# Patient Record
Sex: Female | Born: 1965 | Race: White | Hispanic: No | Smoking: Current some day smoker
Health system: Southern US, Community
[De-identification: ages and names within clinical notes are randomized; demographics above are authoritative.]

## PROBLEM LIST (undated history)

## (undated) DIAGNOSIS — F32A Depression, unspecified: Secondary | ICD-10-CM

## (undated) DIAGNOSIS — K219 Gastro-esophageal reflux disease without esophagitis: Secondary | ICD-10-CM

## (undated) DIAGNOSIS — M1990 Unspecified osteoarthritis, unspecified site: Secondary | ICD-10-CM

## (undated) DIAGNOSIS — I499 Cardiac arrhythmia, unspecified: Secondary | ICD-10-CM

## (undated) DIAGNOSIS — IMO0002 Reserved for concepts with insufficient information to code with codable children: Secondary | ICD-10-CM

## (undated) DIAGNOSIS — T4145XA Adverse effect of unspecified anesthetic, initial encounter: Secondary | ICD-10-CM

## (undated) DIAGNOSIS — I341 Nonrheumatic mitral (valve) prolapse: Secondary | ICD-10-CM

## (undated) DIAGNOSIS — R112 Nausea with vomiting, unspecified: Secondary | ICD-10-CM

## (undated) DIAGNOSIS — K5900 Constipation, unspecified: Secondary | ICD-10-CM

## (undated) DIAGNOSIS — I10 Essential (primary) hypertension: Secondary | ICD-10-CM

## (undated) DIAGNOSIS — K649 Unspecified hemorrhoids: Secondary | ICD-10-CM

## (undated) DIAGNOSIS — R002 Palpitations: Secondary | ICD-10-CM

## (undated) DIAGNOSIS — F329 Major depressive disorder, single episode, unspecified: Secondary | ICD-10-CM

## (undated) DIAGNOSIS — R51 Headache: Secondary | ICD-10-CM

## (undated) DIAGNOSIS — G35D Multiple sclerosis, unspecified: Secondary | ICD-10-CM

## (undated) DIAGNOSIS — G35 Multiple sclerosis: Secondary | ICD-10-CM

## (undated) DIAGNOSIS — Z9889 Other specified postprocedural states: Secondary | ICD-10-CM

## (undated) DIAGNOSIS — T8859XA Other complications of anesthesia, initial encounter: Secondary | ICD-10-CM

## (undated) DIAGNOSIS — F419 Anxiety disorder, unspecified: Secondary | ICD-10-CM

## (undated) HISTORY — PX: ABDOMINAL HYSTERECTOMY: SHX81

## (undated) HISTORY — DX: Essential (primary) hypertension: I10

## (undated) HISTORY — DX: Palpitations: R00.2

## (undated) HISTORY — DX: Reserved for concepts with insufficient information to code with codable children: IMO0002

## (undated) HISTORY — PX: SHOULDER SURGERY: SHX246

## (undated) HISTORY — PX: VAGINAL HYSTERECTOMY: SHX2639

## (undated) HISTORY — DX: Multiple sclerosis, unspecified: G35.D

## (undated) HISTORY — DX: Gilbert syndrome: E80.4

## (undated) HISTORY — PX: DILATION AND CURETTAGE OF UTERUS: SHX78

## (undated) HISTORY — DX: Unspecified hemorrhoids: K64.9

## (undated) HISTORY — DX: Multiple sclerosis: G35

## (undated) HISTORY — DX: Anxiety disorder, unspecified: F41.9

## (undated) HISTORY — DX: Headache: R51

## (undated) HISTORY — PX: ENDOVENOUS ABLATION SAPHENOUS VEIN W/ LASER: SUR449

## (undated) HISTORY — PX: TUBAL LIGATION: SHX77

---

## 1996-09-08 DIAGNOSIS — IMO0002 Reserved for concepts with insufficient information to code with codable children: Secondary | ICD-10-CM

## 1996-09-08 HISTORY — DX: Reserved for concepts with insufficient information to code with codable children: IMO0002

## 1999-05-20 ENCOUNTER — Encounter: Payer: Self-pay | Admitting: Obstetrics and Gynecology

## 1999-05-20 ENCOUNTER — Ambulatory Visit (HOSPITAL_COMMUNITY): Admission: RE | Admit: 1999-05-20 | Discharge: 1999-05-20 | Payer: Self-pay | Admitting: Obstetrics and Gynecology

## 2000-11-22 ENCOUNTER — Ambulatory Visit (HOSPITAL_COMMUNITY): Admission: RE | Admit: 2000-11-22 | Discharge: 2000-11-22 | Payer: Self-pay | Admitting: Internal Medicine

## 2001-09-06 ENCOUNTER — Encounter: Payer: Self-pay | Admitting: Gynecology

## 2001-09-13 ENCOUNTER — Inpatient Hospital Stay (HOSPITAL_COMMUNITY): Admission: RE | Admit: 2001-09-13 | Discharge: 2001-09-16 | Payer: Self-pay | Admitting: Gynecology

## 2001-09-13 ENCOUNTER — Encounter (INDEPENDENT_AMBULATORY_CARE_PROVIDER_SITE_OTHER): Payer: Self-pay | Admitting: Specialist

## 2002-03-16 ENCOUNTER — Ambulatory Visit (HOSPITAL_COMMUNITY): Admission: RE | Admit: 2002-03-16 | Discharge: 2002-03-16 | Payer: Self-pay | Admitting: Gastroenterology

## 2002-11-21 ENCOUNTER — Other Ambulatory Visit: Admission: RE | Admit: 2002-11-21 | Discharge: 2002-11-21 | Payer: Self-pay | Admitting: Gynecology

## 2004-10-07 ENCOUNTER — Other Ambulatory Visit: Admission: RE | Admit: 2004-10-07 | Discharge: 2004-10-07 | Payer: Self-pay | Admitting: Gynecology

## 2004-10-21 ENCOUNTER — Ambulatory Visit (HOSPITAL_COMMUNITY): Admission: RE | Admit: 2004-10-21 | Discharge: 2004-10-21 | Payer: Self-pay | Admitting: Gynecology

## 2004-11-04 ENCOUNTER — Encounter: Admission: RE | Admit: 2004-11-04 | Discharge: 2004-11-04 | Payer: Self-pay | Admitting: Gynecology

## 2005-11-03 ENCOUNTER — Encounter: Admission: RE | Admit: 2005-11-03 | Discharge: 2005-11-03 | Payer: Self-pay | Admitting: Gynecology

## 2006-03-04 ENCOUNTER — Other Ambulatory Visit: Admission: RE | Admit: 2006-03-04 | Discharge: 2006-03-04 | Payer: Self-pay | Admitting: Gynecology

## 2007-01-06 ENCOUNTER — Encounter: Admission: RE | Admit: 2007-01-06 | Discharge: 2007-01-06 | Payer: Self-pay | Admitting: Gynecology

## 2007-08-12 ENCOUNTER — Other Ambulatory Visit: Admission: RE | Admit: 2007-08-12 | Discharge: 2007-08-12 | Payer: Self-pay | Admitting: Gynecology

## 2009-02-07 ENCOUNTER — Encounter: Admission: RE | Admit: 2009-02-07 | Discharge: 2009-02-07 | Payer: Self-pay | Admitting: Gynecology

## 2010-12-11 ENCOUNTER — Other Ambulatory Visit: Payer: Self-pay | Admitting: Gynecology

## 2011-01-24 NOTE — Discharge Summary (Signed)
[facility]  Patient:    Crystal Simon, Crystal Simon Visit Number: [id number] MRN: [id number]          Service Type: Attending:  Gretta Cool, M.D. Dictated by:   Jeani Sow, F.N.P. Adm. Date:  09/13/01 Disc. Date: 09/16/01   CC:         Dr. Leroy Kennedy, [place]   Discharge Summary  HISTORY OF PRESENT ILLNESS:  Ms. Clearance Coots is a 45 year old female gravida 3, para 3 who has had a history of heavy menstrual flow which lasts 7-12 days. She reports spotting prior to the onset of heavy bleeding.  She has a history of endometriosis with laparoscopic treatment in 1985.  On examination, it was noted that she has an extremely retroverted uterus and the patient does report dyspareunia.  She has poor posterior pelvic support with a large enterocele and rectocele with diminished anal sphincter tone and large perineal body musculature loss.  She has ultrasound studies which revealed a very heterogenous myometrial wall field with what appears to be adenomyomas.  There is a thickened, firm fundus compatible with adenomyosis was well.  MRI scan prior to [place] seeing her documented abnormalities of the uterine wall with adenomyosis and thought to also reveal leiomyomatas.  She is now admitted for definitive therapy by laparoscopically-assisted vaginal hysterectomy versus TAH/BSO and posterior enterocele repairs.  She will also have laser standby for the treatment of endometriosis if there is any significant residual present.  Risks and benefits of the procedures have been discussed with the patient and she accepts them.  PHYSICAL EXAMINATION:  CHEST:  Clear to A&P.  HEART:  Heart rate and rhythm are regular without murmur, gallop, or cardiac enlargement.  ABDOMEN:  Soft and scaphoid without masses or organomegaly.  PELVIC:  External genitalia within normal limits for female.  Vagina clean and rugose.  Cervix is parous and deviated anteriorly beneath the bladder by  an exceedingly retroverted uterus.  The uterus is broad, approximately 8-week size, tender to manipulation, and somewhat fixed.  Rectovaginal exam confirms.  RECTAL:  There is diminished anal sphincter tone with a large rectocele and enterocele, a well as loss of the perineal body musculature with diathesis of the levator ani group and significant detachment.  NEUROLOGIC:  Intact and innervation of the anal sphincter but weak muscle tone and weak contraction.  IMPRESSION: 1. Abnormal uterine bleeding and cyclic pelvic pain with known endometriosis    and probable severe adenomyosis of the anterior uterine wall. 2. History of laparoscopic laser ablation of endometriosis. 3. Dyspareunia. 4. Poor posterior pelvic support with enterocele/rectocele, grade 3.  Risks and benefits have been discussed with the patient and she accepts these procedures.  LABORATORY DATA:  Admission hemoglobin 13.6, hematocrit 39.2.  On the first postoperative day, hemoglobin was 11.3, hematocrit 32.5.  The remainder of her preoperative lab work was within normal limits with the exception of an elevated TBIL of 1.5.  Urine was clear.  EKG:  Normal sinus rhythm.  Chest x-ray:  No evidence of acute disease.  HOSPITAL COURSE:  The patient underwent laparoscopically-assisted vaginal hysterectomy, bipolar cautery of endometriosis of the cul-de-sac, posterior enterocele repair, uterosacral cardinal colposuspension under general anesthesia.  The procedures were completed without any complications and the patient was returned to the recovery room in excellent condition.  Her postoperative course was complicated on the night of surgery with much nausea that was well controlled with Phenergan.  On the first postoperative day, she complained of rectal pressure.  The perineum appeared  normal upon inspection. She, on the second postoperative day, became very uncomfortable, unable to void.  It was felt that the pressure  was from the catheter itself.  There was no evidence of hematoma.  On the day of discharge, she was voiding on her own and was discharged without the catheter.  FINAL DISCHARGE INSTRUCTIONS:  No heavy lifting or straining, no vaginal entrance, and increased ambulation as tolerated.  She is to report any fever of over 100.5 or failure of daily improvement.  DIET:  Regular.  DISCHARGE MEDICATIONS: 1. Tylox 1 q.4h. p.r.n. discomfort. 2. Cipro 500 mg 1 q.12h. for two days. 3. Xanax 0.5 mg 1 daily as needed for anxiety. 4. Urecholine 25 mg three times a day.  FOLLOWUP:  She is to return to the office in three days for followup.  CONDITION ON DISCHARGE:  Excellent.  FINAL DISCHARGE DIAGNOSES: 1. Endometriosis, adenomyosis with incapacitating cyclic pelvic pain and    dyspareunia. 2. Abnormal uterine bleeding of undetermined etiology, probably secondary to    #1. 3. History of laparoscopic laser ablation of endometriosis elsewhere.  PROCEDURES PERFORMED: 1. Laparoscopic-assisted vaginal hysterectomy. 2. Bipolar cautery of endometriosis of the cul-de-sac. 3. Posterior enterocele repairs. 4. Uterosacral cardinal colposuspension. Dictated by:   Jeani Sow, F.N.P. Attending:  Gretta Cool, M.D. DD:  10/04/01 TD:  10/04/01 Job: [id number]

## 2011-01-24 NOTE — Op Note (Signed)
[facility]  Patient:    Crystal Simon, Crystal Simon Visit Number: [id number] MRN: [id number]          Service Type: Attending:  Gretta Cool, M.D. Dictated by:   Gretta Cool, M.D. Proc. Date: 09/16/01   CC:         Dr. Lodema Hong, Rosalita Levan, [place]   Operative Report  PREOPERATIVE DIAGNOSES: 1. Endometriosis, adenomyosis, with incapacitating cyclic pelvic pain and    dyspareunia. 2. Abnormal uterine bleeding of undetermined etiology, probably secondary to    above. 3. History of laparoscopy laser ablation of endometriosis elsewhere.  POSTOPERATIVE DIAGNOSES: 1. Endometriosis, adenomyosis, with incapacitating cyclic pelvic pain and    dyspareunia. 2. Abnormal uterine bleeding of undetermined etiology, probably secondary to    above. 3. History of laparoscopy laser ablation of endometriosis elsewhere.  PROCEDURES: 1. Laparoscopic-assisted vaginal hysterectomy 2. Bipolar cautery of endometriosis of the cul-de-sac. 3. Posterior and enterocele repair. 4. Uterosacral cardinal colposuspension.  SURGEON:  Gretta Cool, M.D.  ASSISTANT:  Raynald Kemp, M.D.  ANESTHESIA:  General orotracheal.  DESCRIPTION OF PROCEDURE:  Under excellent general anesthesia, as above, with the patients abdomen prepped and draped, in Allen stirrups, in modified lithotomy position with her bladder drained by a Foley catheter, a subumbilical incision was made and Veress cannula introduced.  After adequate pneumoperitoneum, laparoscopic trocar was introduced and pelvic organs visualized.  There was minimal endometriosis persisted in the cul-de-sac.  The uterus was large, boggy, and quite retroverted.  There was previous tubal sterilization with suspicious areas compatible with endometriosis at the proximal tubal site.  The endometriosis areas were treated by bipolar cautery rather than by laser ablation.  At this point, accessory ports were placed in the right lower quadrant  on each side under direct visualization to avoid vascular structures.  At this point, a Seitzinger tripolar forceps was used to transect the round ligament and then the ovarian ligaments on each side.  Both ovaries were spared.  The dissection was then continued by tripolar forceps down to the level of the uterine vessels.  At this point, the pedicles were all dry.  Attention was then turned to the vaginal portion of the procedure. The patients legs were then placed in elevated position to achieve lithotomy. The cervix was then pulled down into view with a single-tooth tenaculum.  The mucosa was injected with Xylocaine with epinephrine 1:200,000. The mucosa was then incised, then the mucosa pushed off the lower segment.  Cul-de-sac was then entered.  The uterosacral and cardinal ligaments were then clamped, cut, sutured, and tied with 0 Vicryl.  At this point, the anterior vesicovaginal plica was opened and a Deaver placed beneath the bladder to prevent bladder ureter injury.  The uterine vessels were then clamped with Heaney clamps, cut, sutured, and tied with 0 Vicryl.  The Masterson clamps were then used to connect the vaginal portion of the procedure to the abdominal laparoscopic dissection.  At this point, the uterus was removed and all of the pedicles ligated, and careful inspection revealed no significant bleeding.  The pedicles were trimmed to remove any excess nonvascular tissue.  The cul-de-sac was then closed with a pursestring suture of #0 Monocryl.  At this point, the cardinal uterosacral complex colposuspension was performed using 0 Ethibond sutures.  The vaginal cuff was then closed with a running suture of #0 Vicryl with great care to secure the uterosacral and cardinal ligaments over the complex suspension with Ethibond.  At this point, attention was turned  to the posterior repair.  The mucosa was incised to the introitus and the incision extended to the apex of the vaginal  cuff.  Mucosa was then dissected from the perirectal fascia.  A huge enterocele sac was noted with no significant fascial support over it.  The enterocele sac was then plicated with a suture of 0 Monocryl.  The uterosacral ligaments were then identified posteriorly, and a suture of 0 Ethibond placed and secured to the detached perirectal fascia.  The fascia had detached from the apex of the cuff from childbirth injury and had separated to the lower third of the vagina.  The fascia was pulled all the way to the apex of the cuff and secured to the uterosacral cardinal complex.  Interrupted sutures of 0 Vicryl were then used to approximate the fascia to the cuff closure at the apex of the vagina.  The upper layers of perirectal fascia were then plicated in the midline so as to achieve a complete envelope of fascia.  The mucosa was then trimmed.  The levator fascia was then plicated in the midline with interrupted sutures of 0 Vicryl.  The mucosa and upper layers of endopelvic fascia were then plicated in the midline with a running suture of 2-0 Vicryl.  The perineal body musculature was then rebuilt and the procedure terminated without complication.  At this point, the sponge and lap counts were correct.  There were no complications.  The patient returned to the recovery room in excellent condition. Dictated by:   Gretta Cool, M.D. Attending:  Gretta Cool, M.D. DD:  09/16/01 TD:  09/17/01 Job: [id number]

## 2011-01-24 NOTE — H&P (Signed)
[facility]  Patient:    Crystal Simon, OSIER Visit Number: [id number] MRN: [id number]          Service Type: Attending:  Gretta Cool, M.D. Dictated by:   Gretta Cool, M.D.   CC:         Dr. Leroy Kennedy, [place]   History and Physical  CHIEF COMPLAINT: 1. Abnormal uterine bleeding with flow lasting 7-12 days. 2. Pelvic support problems.  HISTORY OF PRESENT ILLNESS:  A 45 year old, gravida 3, para 3, with a history of extremely heavy menstrual flow lasting for 7-12 days.  She has spotting prior to onset of heavy bleeding.  She has a history of endometriosis with laparoscopic treatment in 1985.  She has an extremely retroverted uterus with a history of deep penetration-type dyspareunia.  She also has poor posterior pelvic support with large enterocele and rectocele with diminished anal sphincter tone and loss of perineal body musculature.  She has had ultrasound study which revealed a very heterogeneous myometrial wall filled with what appears to be adenomas.  She has a thickened firm fundus compatible with adenomyosis as well.  She has had MRI scan prior to our seeing her with abnormalities of the uterine wall documenting the adenomyosis thought by MRI scan to represent leiomyomata.  She is now admitted for definitive therapy by laparoscopically-assisted vaginal hysterectomy versus TAH and BSO and posterior and enterocele repair.  She has laser standby for treatment of endometriosis if there is significant residual present.  She understands the alternative forms of therapy, including medical and surgical.  She understands the risks and benefits of the procedure.  PAST SURGICAL HISTORY:  Vaginal delivery x 3 elsewhere with poor posterior pelvic support as a result of vaginal delivery.  She has a history of laparoscopy and D&C elsewhere in 1985 with finding of endometriosis.  History of tubal sterilization by Dr. Merton Border in 1993.  PAST  MEDICAL HISTORY:  History of hypertension on atenolol under the primary care of Dr. Lodema Hong in [place], [place].  Currently on atenolol 50 mg daily.  No other significant medical illness.  HABITS:  Denies ethanol or tobacco.  Denies recreational drugs.  FAMILY HISTORY:  Mother and father are both living and well.  Her mother has a history of stomach problems and skin cancer.  Maternal aunts and uncles have diabetes.  Her maternal grandmother had hypercholesterolemia.  REVIEW OF SYSTEMS:  HEENT:  Denies symptoms.  Cardiorespiratory:  Denies asthma, cough, bronchitis, and shortness of breath.  GU:  Denies frequency, urgency, or dysuria.  GI:  Denies change in bowel habits or food intolerance.  PHYSICAL EXAMINATION:  GENERAL APPEARANCE:  A well-developed, tall, white female.  HEENT:  Pupils equal, round, and reactive to light and accommodation.  Fundi not examined.  Oropharynx clear.  NECK:  Supple without mass or thyroid enlargement.  CHEST:  Clear to P and A.  HEART:  Regular rhythm without murmur or cardiac enlargement.  BREASTS:  Soft without mass noted or nipple discharge.  ABDOMEN:  Soft and scaphoid without mass or organomegaly.  PELVIC:  External genitalia normal female.  Vagina clean and rugous.  The cervix is parous and deviated anteriorly underneath the bladder by an exceedingly retroverted uterus.  Her uterus is broad, approximately eight weeks size, tender to manipulation, and somewhat fixed.  Rectovaginal confirms.  RECTAL:  There is a diminished anal sphincter tone with large rectocele and enterocele, loss of perineal body musculature with diastasis of the levator ani group, and significant  detachment.  EXTREMITIES:  Negative.  NEUROLOGIC:  Physiologic.  Intact enervation of the anal sphincter, but weak muscle tone and weak contractions.  IMPRESSION: 1. Abnormal uterine bleeding and cyclic pelvic pain with known endometriosis    and probable severe  adenomyosis of the anterior uterine wall. 2. History of laparoscopic laser ablation of endometriosis. 3. Dyspareunia. 4. Poor posterior pelvic support with enterocele and rectocele, grade 3.  PLAN:  I have recommended on to laparoscopically-assisted vaginal hysterectomy versus TAH and BSO and posterior and enterocele repair.  She understands the alternative forms of therapy and conservative procedure if possible. Dictated by:   Gretta Cool, M.D. Attending:  Gretta Cool, M.D. DD:  09/16/01 TD:  09/17/01 Job: [id number]

## 2011-05-21 ENCOUNTER — Other Ambulatory Visit: Payer: Self-pay | Admitting: Gynecology

## 2011-05-21 DIAGNOSIS — Z1231 Encounter for screening mammogram for malignant neoplasm of breast: Secondary | ICD-10-CM

## 2011-05-28 ENCOUNTER — Other Ambulatory Visit: Payer: Self-pay | Admitting: Dermatology

## 2011-05-28 ENCOUNTER — Ambulatory Visit
Admission: RE | Admit: 2011-05-28 | Discharge: 2011-05-28 | Disposition: A | Discharge status: Home / Self Care | Payer: 59 | Source: Ambulatory Visit | Attending: Gynecology | Admitting: Gynecology

## 2011-05-28 DIAGNOSIS — Z1231 Encounter for screening mammogram for malignant neoplasm of breast: Secondary | ICD-10-CM

## 2011-06-04 ENCOUNTER — Other Ambulatory Visit: Payer: Self-pay | Admitting: Gynecology

## 2011-06-04 DIAGNOSIS — R928 Other abnormal and inconclusive findings on diagnostic imaging of breast: Secondary | ICD-10-CM

## 2011-06-18 ENCOUNTER — Ambulatory Visit
Admission: RE | Admit: 2011-06-18 | Discharge: 2011-06-18 | Disposition: A | Discharge status: Home / Self Care | Payer: 59 | Source: Ambulatory Visit | Attending: Gynecology | Admitting: Gynecology

## 2011-06-18 DIAGNOSIS — R928 Other abnormal and inconclusive findings on diagnostic imaging of breast: Secondary | ICD-10-CM

## 2011-11-13 ENCOUNTER — Telehealth: Payer: Self-pay | Admitting: *Deleted

## 2011-11-13 ENCOUNTER — Other Ambulatory Visit: Payer: Self-pay | Admitting: *Deleted

## 2011-11-13 DIAGNOSIS — I83893 Varicose veins of bilateral lower extremities with other complications: Secondary | ICD-10-CM

## 2011-11-13 NOTE — Telephone Encounter (Signed)
The patient came to see Dr. Arbie Cookey a few years ago but decided to go elsewhere for vein treatment. She now regrets this and wants reeval with Dr. Arbie Cookey. I have ordered an ultrasound because she is complaining of new varicose veins and pain. She requested Dr. Arbie Cookey so we have made an appt with him.

## 2012-01-13 ENCOUNTER — Ambulatory Visit: Payer: 59 | Admitting: Vascular Surgery

## 2012-01-20 ENCOUNTER — Encounter: Payer: Self-pay | Admitting: Vascular Surgery

## 2012-01-21 ENCOUNTER — Encounter: Payer: Self-pay | Admitting: Vascular Surgery

## 2012-01-22 ENCOUNTER — Encounter (INDEPENDENT_AMBULATORY_CARE_PROVIDER_SITE_OTHER): Payer: 59 | Admitting: *Deleted

## 2012-01-22 ENCOUNTER — Encounter: Payer: Self-pay | Admitting: Vascular Surgery

## 2012-01-22 ENCOUNTER — Ambulatory Visit (INDEPENDENT_AMBULATORY_CARE_PROVIDER_SITE_OTHER): Payer: 59 | Admitting: Vascular Surgery

## 2012-01-22 VITALS — BP 137/79 | HR 75 | Resp 18 | Ht 69.0 in | Wt 159.3 lb

## 2012-01-22 DIAGNOSIS — I83893 Varicose veins of bilateral lower extremities with other complications: Secondary | ICD-10-CM

## 2012-01-22 NOTE — Progress Notes (Signed)
[facility] [place] HISTORY AND PHYSICAL   History of Present Illness:  Patient is a 46 y.o. year old female who presents for evaluation of symptomatic varicose veins.  The patient previously had ablation of her right greater saphenous vein and left greater saphenous in [place] several years ago. She states that she never really had any improvement in the left leg. She has also had one bleeding episode from a varicosity in the left leg. She describes aching burning and swelling in both lower extremities. She states she has been wearing compression stockings since 2011 but has not achieved relief with this. These are thigh high compression stockings. Other medical problems include hypertension and headaches which are currently controlled.  She takes nonsteroidal anti-inflammatories intermittently for the pain in her legs  Past Medical History  Diagnosis Date  . Hypertension   . Heart palpitations   . Varicose veins   . Headache     Past Surgical History  Procedure Date  . Abdominal hysterectomy   . Endovenous ablation saphenous vein w/ laser summer 2011    right and left greater saphenous veins ([place], [place])       Social History History  Substance Use Topics  . Smoking status: Former Smoker    Quit date: 09/08/1994  . Smokeless tobacco: Not on file  . Alcohol Use: No    Family History Family History  Problem Relation Age of Onset  . Heart disease Father     Allergies  No Known Allergies   Current Outpatient Prescriptions  Medication Sig Dispense Refill  . ibuprofen (ADVIL,MOTRIN) 200 MG tablet Take 200 mg by mouth every 6 (six) hours as needed.      . ALPRAZolam (XANAX) 0.5 MG tablet Take 0.5 mg by mouth.        ROS:   General:  No weight loss, Fever, chills  HEENT: No recent headaches, no nasal bleeding, no visual changes, no sore throat  Neurologic: No dizziness, blackouts, seizures. No recent symptoms of stroke or mini- stroke. No recent  episodes of slurred speech, or temporary blindness.  Cardiac: No recent episodes of chest pain/pressure, no shortness of breath at rest.  No shortness of breath with exertion.  Denies history of atrial fibrillation or irregular heartbeat  Vascular: No history of rest pain in feet.  No history of claudication.  No history of non-healing ulcer, No history of DVT   Pulmonary: No home oxygen, no productive cough, no hemoptysis,  No asthma or wheezing  Musculoskeletal:  [ ]  Arthritis, [ ]  Low back pain,  [ ]  Joint pain  Hematologic:No history of hypercoagulable state.  No history of easy bleeding.  No history of anemia  Gastrointestinal: No hematochezia or melena,  No gastroesophageal reflux, no trouble swallowing  Urinary: [ ]  chronic Kidney disease, [ ]  on HD - [ ]  MWF or [ ]  TTHS, [ ]  Burning with urination, [ ]  Frequent urination, [ ]  Difficulty urinating;   Skin: No rashes  Psychological: No history of anxiety,  No history of depression   Physical Examination  Filed Vitals:   01/22/12 1426  BP: 137/79  Pulse: 75  Resp: 18  Height: 5\' 9"  (1.753 m)  Weight: 159 lb 4.8 oz (72.258 kg)    Body mass index is 23.52 kg/(m^2).  General:  Alert and oriented, no acute distress HEENT: Normal Neck: No bruit or JVD Pulmonary: Clear to auscultation bilaterally Cardiac: Regular Rate and Rhythm without murmur Abdomen: Soft, non-tender, non-distended, no mass  Skin: No rash,  varicosities right anterior tibial region 3-4 mm in diameter. Multiple large clusters of 46 mm varicosities in the left posterior calf Extremity Pulses:  2+ radial, brachial, femoral, dorsalis pedis, posterior tibial pulses bilaterally Musculoskeletal: No deformity or edema  Neurologic: Upper and lower extremity motor 5/5 and symmetric  DATA: Patient had a venous duplex exam which I reviewed and interpreted today. This showed incompetence of her left greater saphenous system. 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 The patient will continue to be compliant with thigh high compression stockings over the next 3 months and then return for further evaluation and possible ablation. We will also try to obtain her records from [place].   PLAN: See above  Fabienne Bruns, MD [facility] [place] Office: [phone] Pager: [phone]

## 2012-01-30 NOTE — Procedures (Unsigned)
DUPLEX DEEP VENOUS EXAM - LOWER EXTREMITY  INDICATION:  Varicose vein with other complication  HISTORY:  Edema:  No Trauma/Surgery:  The patient states history of bilateral great saphenous vein laser ablations in 2011 Pain:  Mild lower leg pain PE:  No Previous DVT:  No Anticoagulants: Other:  DUPLEX EXAM:               CFV   SFV   PopV  PTV    GSV               R  L  R  L  R  L  R   L  R  L Thrombosis    o  o  o  o  o  o  o   o     o Spontaneous   +  +  +  +  +  +  +   +     + Phasic        +  +  +  +  +  +  +   +     + Augmentation  +  +  +  +  +  +  +   +     + Compressible  +  +  +  +  +  +  +   +     + Competent     +  +  +  +  +  +  +   +     o  Legend:  + - yes  o - no  p - partial  D - decreased  IMPRESSION: 1. No evidence of deep vein thrombosis noted in the bilateral lower     extremities. 2. The right great saphenous vein was not adequately visualized due to     history of laser ablation.  The left great saphenous vein     demonstrates reflux of >500 milliseconds from the mid thigh to knee     level. 3. The bilateral short saphenous veins are competent. 4. Diameter measurements of the left great saphenous vein along with     other measurements are noted on the separate worksheet.   _____________________________ Janetta Hora. Fields, MD  CH/MEDQ  D:  01/26/2012  T:  01/26/2012  Job:  [id number]

## 2012-04-19 ENCOUNTER — Other Ambulatory Visit: Payer: Self-pay | Admitting: Gynecology

## 2012-04-19 ENCOUNTER — Encounter: Payer: Self-pay | Admitting: Vascular Surgery

## 2012-04-20 ENCOUNTER — Encounter: Payer: Self-pay | Admitting: Vascular Surgery

## 2012-04-20 ENCOUNTER — Ambulatory Visit (INDEPENDENT_AMBULATORY_CARE_PROVIDER_SITE_OTHER): Payer: 59 | Admitting: Vascular Surgery

## 2012-04-20 VITALS — BP 139/94 | HR 86 | Resp 18 | Ht 69.0 in | Wt 167.6 lb

## 2012-04-20 DIAGNOSIS — I83893 Varicose veins of bilateral lower extremities with other complications: Secondary | ICD-10-CM

## 2012-04-20 NOTE — Progress Notes (Signed)
Problems with Activities of Daily Living Secondary to Leg Pain  1. 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  This is very difficult due to leg pain and swelling.  2. Crystal Simon has difficulty failing asleep due to leg pain.  3. Crystal Simon has had to severely decreased the frequency and duration of walking and running for exercise.   Rankin, Sonya Dowling   Failure of  Conservative Therapy:  1. Worn 20-30 mm Hg thigh high compression hose >3 months with no relief of symptoms.  2. Frequently elevates legs-no relief of symptoms  3. Taken Ibuprofen 600 Mg TID with no relief of symptoms.  The patient presents today for continued discussion of her venous pathology. Had actually seen her in 2007 regarding bilateral venous hypertension. This is worse on the left than on the right. She successfully underwent a treatment with bilateral great saphenous vein ablation in 2010   at [facility]. I do have is from this facility suggesting ultrasound showing closure of both great saphenous veins. She has had recurrent symptoms most particularly over the left leg. She did not have her large painful tributary varicosity over the left posterior calf addressed during these prior procedures. Her recent duplex in our office in May revealed recanalization of her left great saphenous vein from the knee just below the saphenofemoral junction with reflux into these varicosities. She continues to have discomfort with prolonged standing despite conservative measures most particularly with her compliance with thigh-high graduated compression garments. I have recommended repeat treatment of her left great saphenous vein and stab phlebectomy of multiple tributary varicosities over medial thigh posterior calf and dorsal ankle. I explained the option of attempted re\re ablation with laser versus ligation stripping in the operating room. She does not wish to consider ablation since  she's had one failure another facility. I feel this is appropriate alternative treatment the patient wishes this afforded a durable result. She understands the different with several small incisions in addition to the phlebectomy stab incisions to remove her tributary painful varicosities as well. We will schedule this at her earliest convenience as an outpatient [facility] 

## 2012-04-28 ENCOUNTER — Other Ambulatory Visit: Payer: Self-pay | Admitting: *Deleted

## 2012-04-30 ENCOUNTER — Encounter (HOSPITAL_COMMUNITY): Payer: Self-pay | Admitting: Pharmacy Technician

## 2012-05-04 ENCOUNTER — Telehealth: Payer: Self-pay | Admitting: *Deleted

## 2012-05-04 NOTE — Telephone Encounter (Signed)
Spoke with multiple representatives throughout the day from [employer] Department regarding setting up a peer-to-peer review telephone conference to discuss [employer]'s denial of CPT  [phone].  Dr. Arbie Cookey is in the office only on Tuesday this week, and the surgery is scheduled for 05-07-2012, so the peer-to-peer review telephone conference between the [facility] medical director and Dr. Arbie Cookey had to take place today (05-04-2012). Dr. Arbie Cookey spoke with [facility] medical director Dr. Thedore Mins regarding the specifics of the denial of CPT [phone] for Reference # [id number]. Dr. Christella Hartigan overturned the denial and approved CPT [phone].  Dr. Christella Hartigan stated a fax of the approval of CPT [phone] would be sent from [facility].   Ms. Clearance Coots was notified of the peer-to-peer conference and approval of CPT [phone].

## 2012-05-05 ENCOUNTER — Telehealth: Payer: Self-pay | Admitting: *Deleted

## 2012-05-05 NOTE — Telephone Encounter (Signed)
Spoke with Kendal Hymen [phone]) at [facility] pre-service center and informed her that Dr. Arbie Cookey had peer-to-peer review with [facility] medical director Thedore Mins on 05-04-2012 regarding denial of CPT [phone].  Dr. Thedore Mins [facility] medical director) approved CPT [phone] (overturned denial). Dr. Christella Hartigan states Dr. Arbie Cookey will receive FAX of the approval. Authorization # [id number].

## 2012-05-05 NOTE — Telephone Encounter (Signed)
Spoke with [facility] representative) in the Care Coordination Department regarding getting a FAX sent to Dr. Arbie Cookey verifying approval of CPT [phone] after peer-to-peer review telephone conference on 05-04-2012 between Dr. Tawanna Cooler Early and Dr. Thedore Mins [facility] medical director) overturning denial of CPT [phone]. Flo verified in [facility] computer records that CPT [phone] had been approved authorization # [id number] after peer-to-peer review between Drs. Early and Christella Hartigan on 05-04-2012. Flo is aware that Dr. Arbie Cookey has not received Fax confirming approval of CPT [phone] and she is requesting that a Faxed copy of approval be sent to Dr. Bosie Helper office today (05-05-2012).

## 2012-05-06 ENCOUNTER — Encounter (HOSPITAL_COMMUNITY): Payer: Self-pay | Admitting: *Deleted

## 2012-05-06 MED ORDER — CEFUROXIME SODIUM 1.5 G IJ SOLR
1.5000 g | INTRAMUSCULAR | Status: AC
  Administered 2012-05-07: 1.5 g via INTRAVENOUS
  Filled 2012-05-06: qty 1.5

## 2012-05-07 ENCOUNTER — Encounter (HOSPITAL_COMMUNITY): Payer: Self-pay | Admitting: Anesthesiology

## 2012-05-07 ENCOUNTER — Ambulatory Visit (HOSPITAL_COMMUNITY): Payer: 59

## 2012-05-07 ENCOUNTER — Encounter (HOSPITAL_COMMUNITY): Payer: Self-pay | Admitting: *Deleted

## 2012-05-07 ENCOUNTER — Ambulatory Visit (HOSPITAL_COMMUNITY)
Admission: RE | Admit: 2012-05-07 | Discharge: 2012-05-07 | Disposition: A | Discharge status: Home / Self Care | Payer: 59 | Source: Ambulatory Visit | Attending: Vascular Surgery | Admitting: Vascular Surgery

## 2012-05-07 ENCOUNTER — Encounter (HOSPITAL_COMMUNITY)
Admission: RE | Disposition: A | Discharge status: Home / Self Care | Payer: Self-pay | Source: Ambulatory Visit | Attending: Vascular Surgery

## 2012-05-07 ENCOUNTER — Telehealth: Payer: Self-pay | Admitting: Vascular Surgery

## 2012-05-07 ENCOUNTER — Ambulatory Visit (HOSPITAL_COMMUNITY): Payer: 59 | Admitting: Anesthesiology

## 2012-05-07 DIAGNOSIS — L97929 Non-pressure chronic ulcer of unspecified part of left lower leg with unspecified severity: Secondary | ICD-10-CM

## 2012-05-07 DIAGNOSIS — I83229 Varicose veins of left lower extremity with both ulcer of unspecified site and inflammation: Secondary | ICD-10-CM

## 2012-05-07 DIAGNOSIS — I83219 Varicose veins of right lower extremity with both ulcer of unspecified site and inflammation: Secondary | ICD-10-CM

## 2012-05-07 DIAGNOSIS — L97919 Non-pressure chronic ulcer of unspecified part of right lower leg with unspecified severity: Secondary | ICD-10-CM

## 2012-05-07 DIAGNOSIS — I10 Essential (primary) hypertension: Secondary | ICD-10-CM | POA: Insufficient documentation

## 2012-05-07 DIAGNOSIS — I83893 Varicose veins of bilateral lower extremities with other complications: Secondary | ICD-10-CM | POA: Insufficient documentation

## 2012-05-07 HISTORY — DX: Constipation, unspecified: K59.00

## 2012-05-07 HISTORY — DX: Other specified postprocedural states: R11.2

## 2012-05-07 HISTORY — DX: Other complications of anesthesia, initial encounter: T88.59XA

## 2012-05-07 HISTORY — PX: VEIN LIGATION AND STRIPPING: SHX2653

## 2012-05-07 HISTORY — DX: Adverse effect of unspecified anesthetic, initial encounter: T41.45XA

## 2012-05-07 HISTORY — DX: Other specified postprocedural states: Z98.890

## 2012-05-07 HISTORY — DX: Nausea with vomiting, unspecified: R11.2

## 2012-05-07 LAB — COMPREHENSIVE METABOLIC PANEL
ALT: 10 U/L (ref 0–35)
AST: 14 U/L (ref 0–37)
Albumin: 3.9 g/dL (ref 3.5–5.2)
Calcium: 9.6 mg/dL (ref 8.4–10.5)
GFR calc Af Amer: >90 mL/min (ref >90)
Glucose, Bld: 85 mg/dL (ref 70–99)
Sodium: 140 mEq/L (ref 135–145)
Total Protein: 7 g/dL (ref 6.0–8.3)

## 2012-05-07 LAB — URINALYSIS, ROUTINE W REFLEX MICROSCOPIC
Bilirubin Urine: NEGATIVE
Nitrite: NEGATIVE
Specific Gravity, Urine: 1.007 (ref 1.005–1.030)
pH: 7.5 (ref 5.0–8.0)

## 2012-05-07 LAB — CBC
MCH: 30.3 pg (ref 26.0–34.0)
MCHC: 33.4 g/dL (ref 30.0–36.0)
Platelets: 255 10*3/uL (ref 150–400)
RDW: 12.6 % (ref 11.5–15.5)

## 2012-05-07 LAB — SURGICAL PCR SCREEN: MRSA, PCR: NEGATIVE

## 2012-05-07 SURGERY — LIGATION AND STRIPPING, VARICOSE VEIN
Anesthesia: General | Site: Leg Lower | Laterality: Left | Wound class: Clean

## 2012-05-07 MED ORDER — LIDOCAINE HCL (CARDIAC) 20 MG/ML IV SOLN
INTRAVENOUS | Status: DC | PRN
  Administered 2012-05-07: 50 mg via INTRAVENOUS

## 2012-05-07 MED ORDER — OXYCODONE HCL 5 MG PO TABS
ORAL_TABLET | ORAL | Status: AC
  Filled 2012-05-07: qty 1

## 2012-05-07 MED ORDER — OXYCODONE HCL 5 MG PO TABS: 5.0000 mg | ORAL_TABLET | Freq: Four times a day (QID) | ORAL | Status: AC | PRN

## 2012-05-07 MED ORDER — MUPIROCIN 2 % EX OINT
TOPICAL_OINTMENT | CUTANEOUS | Status: AC
  Administered 2012-05-07: 1 via NASAL
  Filled 2012-05-07: qty 22

## 2012-05-07 MED ORDER — 0.9 % SODIUM CHLORIDE (POUR BTL) OPTIME
TOPICAL | Status: DC | PRN
  Administered 2012-05-07: 1000 mL

## 2012-05-07 MED ORDER — FENTANYL CITRATE 0.05 MG/ML IJ SOLN
INTRAMUSCULAR | Status: DC | PRN
  Administered 2012-05-07: 25 ug via INTRAVENOUS
  Administered 2012-05-07: 100 ug via INTRAVENOUS
  Administered 2012-05-07: 25 ug via INTRAVENOUS

## 2012-05-07 MED ORDER — MUPIROCIN 2 % EX OINT: TOPICAL_OINTMENT | Freq: Two times a day (BID) | CUTANEOUS | Status: DC

## 2012-05-07 MED ORDER — EPHEDRINE SULFATE 50 MG/ML IJ SOLN
INTRAMUSCULAR | Status: DC | PRN
  Administered 2012-05-07 (×3): 5 mg via INTRAVENOUS

## 2012-05-07 MED ORDER — HYDROMORPHONE HCL PF 1 MG/ML IJ SOLN
INTRAMUSCULAR | Status: AC
  Filled 2012-05-07: qty 1

## 2012-05-07 MED ORDER — LACTATED RINGERS IV SOLN
INTRAVENOUS | Status: DC | PRN
  Administered 2012-05-07 (×2): via INTRAVENOUS

## 2012-05-07 MED ORDER — HYDROMORPHONE HCL PF 1 MG/ML IJ SOLN
0.2500 mg | INTRAMUSCULAR | Status: DC | PRN
  Administered 2012-05-07 (×2): 0.5 mg via INTRAVENOUS

## 2012-05-07 MED ORDER — DROPERIDOL 2.5 MG/ML IJ SOLN: 0.6250 mg | INTRAMUSCULAR | Status: DC | PRN

## 2012-05-07 MED ORDER — OXYCODONE HCL 5 MG/5ML PO SOLN: 5.0000 mg | Freq: Once | ORAL | Status: AC | PRN

## 2012-05-07 MED ORDER — PROPOFOL 10 MG/ML IV EMUL
INTRAVENOUS | Status: DC | PRN
  Administered 2012-05-07: 200 mg via INTRAVENOUS

## 2012-05-07 MED ORDER — ONDANSETRON HCL 4 MG/2ML IJ SOLN
INTRAMUSCULAR | Status: DC | PRN
  Administered 2012-05-07: 4 mg via INTRAVENOUS

## 2012-05-07 MED ORDER — MIDAZOLAM HCL 5 MG/5ML IJ SOLN
INTRAMUSCULAR | Status: DC | PRN
  Administered 2012-05-07: 2 mg via INTRAVENOUS

## 2012-05-07 MED ORDER — LACTATED RINGERS IV SOLN
INTRAVENOUS | Status: DC
  Administered 2012-05-07: 13:00:00 via INTRAVENOUS

## 2012-05-07 MED ORDER — SODIUM CHLORIDE 0.9 % IV SOLN: INTRAVENOUS | Status: DC

## 2012-05-07 MED ORDER — OXYCODONE HCL 5 MG PO TABS
5.0000 mg | ORAL_TABLET | Freq: Once | ORAL | Status: AC | PRN
  Administered 2012-05-07: 5 mg via ORAL

## 2012-05-07 MED ORDER — MUPIROCIN 2 % EX OINT
TOPICAL_OINTMENT | Freq: Once | CUTANEOUS | Status: AC
  Administered 2012-05-07: 1 via NASAL

## 2012-05-07 SURGICAL SUPPLY — 52 items
APL SKNCLS STERI-STRIP NONHPOA (GAUZE/BANDAGES/DRESSINGS) ×1
BAG ISL DRAPE 18X18 STRL (DRAPES) ×1
BAG ISOLATION DRAPE 18X18 (DRAPES) ×1 IMPLANT
BANDAGE GAUZE ELAST BULKY 4 IN (GAUZE/BANDAGES/DRESSINGS) ×3 IMPLANT
BENZOIN TINCTURE PRP APPL 2/3 (GAUZE/BANDAGES/DRESSINGS) ×2 IMPLANT
BLADE SURG 11 STRL SS (BLADE) ×2 IMPLANT
BLADE SURG 15 STRL LF DISP TIS (BLADE) IMPLANT
BLADE SURG 15 STRL SS (BLADE)
BNDG COHESIVE 6X5 TAN STRL LF (GAUZE/BANDAGES/DRESSINGS) ×2 IMPLANT
CANISTER SUCTION 2500CC (MISCELLANEOUS) ×2 IMPLANT
CLIP LIGATING EXTRA MED SLVR (CLIP) ×2 IMPLANT
CLIP LIGATING EXTRA SM BLUE (MISCELLANEOUS) ×2 IMPLANT
CLOTH BEACON ORANGE TIMEOUT ST (SAFETY) ×2 IMPLANT
COVER SURGICAL LIGHT HANDLE (MISCELLANEOUS) ×2 IMPLANT
DRAPE INCISE IOBAN 66X45 STRL (DRAPES) ×2 IMPLANT
DRAPE ISOLATION BAG 18X18 (DRAPES) ×1
DRSG COVADERM 4X8 (GAUZE/BANDAGES/DRESSINGS) ×2 IMPLANT
ELECT REM PT RETURN 9FT ADLT (ELECTROSURGICAL) ×2
ELECTRODE REM PT RTRN 9FT ADLT (ELECTROSURGICAL) ×1 IMPLANT
GLOVE BIO SURGEON STRL SZ 6.5 (GLOVE) ×4 IMPLANT
GLOVE BIOGEL PI IND STRL 6.5 (GLOVE) IMPLANT
GLOVE BIOGEL PI INDICATOR 6.5 (GLOVE) ×2
GLOVE SS BIOGEL STRL SZ 7.5 (GLOVE) ×1 IMPLANT
GLOVE SUPERSENSE BIOGEL SZ 7.5 (GLOVE) ×1
GLOVE SURG SS PI 6.5 STRL IVOR (GLOVE) ×1 IMPLANT
GOWN STRL NON-REIN LRG LVL3 (GOWN DISPOSABLE) ×6 IMPLANT
KIT BASIN OR (CUSTOM PROCEDURE TRAY) ×2 IMPLANT
KIT ROOM TURNOVER OR (KITS) ×2 IMPLANT
LOOP VESSEL MAXI BLUE (MISCELLANEOUS) ×1 IMPLANT
NS IRRIG 1000ML POUR BTL (IV SOLUTION) ×2 IMPLANT
PACK GENERAL/GYN (CUSTOM PROCEDURE TRAY) ×2 IMPLANT
PACK UNIVERSAL I (CUSTOM PROCEDURE TRAY) ×2 IMPLANT
PAD ARMBOARD 7.5X6 YLW CONV (MISCELLANEOUS) ×4 IMPLANT
SPECIMEN JAR SMALL (MISCELLANEOUS) ×2 IMPLANT
SPONGE GAUZE 4X4 12PLY (GAUZE/BANDAGES/DRESSINGS) ×2 IMPLANT
STRIP CLOSURE SKIN 1/2X4 (GAUZE/BANDAGES/DRESSINGS) ×2 IMPLANT
SUT SILK 2 0 (SUTURE) ×2
SUT SILK 2 0 SH (SUTURE) ×2 IMPLANT
SUT SILK 2-0 18XBRD TIE 12 (SUTURE) ×1 IMPLANT
SUT SILK 3 0 (SUTURE) ×2
SUT SILK 3-0 18XBRD TIE 12 (SUTURE) ×1 IMPLANT
SUT VIC AB 3-0 SH 27 (SUTURE) ×2
SUT VIC AB 3-0 SH 27X BRD (SUTURE) ×1 IMPLANT
SUT VIC AB 3-0 SH 8-18 (SUTURE) IMPLANT
SUT VICRYL 4-0 PS2 18IN ABS (SUTURE) ×4 IMPLANT
SUT VICRYL AB 3 0 TIES (SUTURE) ×2 IMPLANT
TAPE CLOTH SURG 4X10 WHT LF (GAUZE/BANDAGES/DRESSINGS) ×1 IMPLANT
TOWEL OR 17X24 6PK STRL BLUE (TOWEL DISPOSABLE) ×4 IMPLANT
TOWEL OR 17X26 10 PK STRL BLUE (TOWEL DISPOSABLE) ×4 IMPLANT
UNDERPAD 30X30 INCONTINENT (UNDERPADS AND DIAPERS) ×2 IMPLANT
VEIN STRIPPER DISP (MISCELLANEOUS) ×2 IMPLANT
WATER STERILE IRR 1000ML POUR (IV SOLUTION) ×2 IMPLANT

## 2012-05-07 NOTE — Telephone Encounter (Addendum)
Message copied by Rosalyn Charters on Fri May 07, 2012  3:52 PM ------      Message from: [place], [place] K      Created: Fri May 07, 2012  3:12 PM      Regarding: Schedule                   ----- Message -----         From: Dara Lords, PA         Sent: 05/07/2012   3:01 PM           To: Sharee Pimple, CMA            Belinda Block, 46 y.o., 28-Aug-1966                   S/p varicose vein stripping.  F/u with Dr. Arbie Cookey in 2 weeks.            Thanks,      Samantha  notified patient of fu appt. with dr. early on 05-25-12 at 11 am.  l/v/m and mailed appt. info

## 2012-05-07 NOTE — H&P (View-Only) (Signed)
Problems with Activities of Daily Living Secondary to Leg Pain  1. 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  This is very difficult due to leg pain and swelling.  2. Mrs. Crystal Simon has difficulty failing asleep due to leg pain.  3. Mrs. Crystal Simon has had to severely decreased the frequency and duration of walking and running for exercise.   Rankin, Neena Rhymes   Failure of  Conservative Therapy:  1. Worn 20-30 mm Hg thigh high compression hose >3 months with no relief of symptoms.  2. Frequently elevates legs-no relief of symptoms  3. Taken Ibuprofen 600 Mg TID with no relief of symptoms.  The patient presents today for continued discussion of her venous pathology. Had actually seen her in 2007 regarding bilateral venous hypertension. This is worse on the left than on the right. She successfully underwent a treatment with bilateral great saphenous vein ablation in 2010  [place] [facility]. I do have is from this facility suggesting ultrasound showing closure of both great saphenous veins. She has had recurrent symptoms most particularly over the left leg. She did not have her large painful tributary varicosity over the left posterior calf addressed during these prior procedures. Her recent duplex in our office in May revealed recanalization of her left great saphenous vein from the knee just below the saphenofemoral junction with reflux into these varicosities. She continues to have discomfort with prolonged standing despite conservative measures most particularly with her compliance with thigh-high graduated compression garments. I have recommended repeat treatment of her left great saphenous vein and stab phlebectomy of multiple tributary varicosities over medial thigh posterior calf and dorsal ankle. I explained the option of attempted re\re ablation with laser versus ligation stripping in the operating room. She does not wish to consider ablation since  she's had one failure another facility. I feel this is appropriate alternative treatment the patient wishes this afforded a durable result. She understands the different with several small incisions in addition to the phlebectomy stab incisions to remove her tributary painful varicosities as well. We will schedule this at her earliest convenience as an outpatient [facility]

## 2012-05-07 NOTE — Preoperative (Signed)
Beta Blockers   Reason not to administer Beta Blockers:Not Applicable 

## 2012-05-07 NOTE — Anesthesia Postprocedure Evaluation (Signed)
Anesthesia Post Note  Patient: Crystal Simon  Procedure(s) Performed: Procedure(s) (LRB): VEIN LIGATION AND STRIPPING (Left)  Anesthesia type: general  Patient location: PACU  Post pain: Pain level controlled  Post assessment: Patient's Cardiovascular Status Stable  Last Vitals:  Filed Vitals:   05/07/12 1530  BP: 109/68  Pulse: 76  Temp:   Resp: 16    Post vital signs: Reviewed and stable  Level of consciousness: sedated  Complications: No apparent anesthesia complications

## 2012-05-07 NOTE — Op Note (Signed)
OPERATIVE REPORT  DATE OF SURGERY: 05/07/2012  PATIENT: Crystal Simon, 46 y.o. female MRN: [id number]  DOB: 1966-07-11  PRE-OPERATIVE DIAGNOSIS: Symptomatic varicose veins left leg  POST-OPERATIVE DIAGNOSIS:  Same  PROCEDURE: Ligation stripping left great saphenous vein, stab phlebectomy of multiple tributary varicosities between 10 and 20  SURGEON:  Gretta Began, M.D.  PHYSICIAN ASSISTANT: Rhyne  ANESTHESIA:  Gen.  EBL: Minimal ml  Total I/O In: 1800 [I.V.:1800] Out: -   BLOOD ADMINISTERED: None  DRAINS: None  SPECIMEN: Varicose veins  COUNTS CORRECT:  YES  PLAN OF CARE: PACU   PATIENT DISPOSITION:  PACU - hemodynamically stable  PROCEDURE DETAILS: Patient was taken to the operating room after the veins were marked. The patient while standing up all surface tributary varicosities in her left leg. These were in the medial thigh and calf. The left groin left leg prepped in a sterile fashion. All Chen visualization was used to identify the level of the enlarged saphenous vein which was of just above the knee extending up to the groin. An incision was made over the saphenous vein just above the knee and the vein was ligated distally and was opened with an 11 blade and a vein stripper was passed centrally up to the level of the groin. Separate incision was made just below the groin crease and the stripper was isolated this area. The vein was ligated proximally with 2-0 Vicryl tie and ligated. The medium stripper head was placed on the vein stripper but this was not removed. The patient was placed in Trendelenburg position and the prior marked veins were removed with stab phlebectomy technique. Multiple small stab wounds were made with an 11 blade. The vein hook was used to remove these tributary varicosities. Pressure was used for hemostasis. The saphenous vein was then stripped and pressure was held over this area for hemostasis. The incisions the area near the groin and at the  area near the knee were closed with 3-0 Vicryl the subcutaneous and subcuticular tissue. The saphenectomy sites were all closed with benzoin and Steri-Strips and benzoin strips across the posterior incisions. Curlex and Coban and a pressure dressing was placed the patient was taken to the recovery in stable condition   Gretta Began, M.D. 05/07/2012 3:22 PM

## 2012-05-07 NOTE — Anesthesia Preprocedure Evaluation (Signed)
Anesthesia Evaluation  Patient identified by MRN, date of birth, ID band Patient awake    Reviewed: Allergy & Precautions, H&P , NPO status , Patient's Chart, lab work & pertinent test results, reviewed documented beta blocker date and time   History of Anesthesia Complications (+) PONV  Airway Mallampati: I TM Distance: >3 FB Neck ROM: full    Dental No notable dental hx. (+) Teeth Intact and Dental Advidsory Given   Pulmonary neg pulmonary ROS,    Pulmonary exam normal       Cardiovascular hypertension, On Home Beta Blockers Rhythm:regular     Neuro/Psych  Headaches,    GI/Hepatic Neg liver ROS,   Endo/Other  negative endocrine ROS  Renal/GU negative Renal ROS     Musculoskeletal   Abdominal Normal abdominal exam  (+)   Peds  Hematology   Anesthesia Other Findings   Reproductive/Obstetrics                           Anesthesia Physical Anesthesia Plan  ASA: II  Anesthesia Plan: General LMA   Post-op Pain Management:    Induction:   Airway Management Planned:   Additional Equipment:   Intra-op Plan:   Post-operative Plan:   Informed Consent: I have reviewed the patients History and Physical, chart, labs and discussed the procedure including the risks, benefits and alternatives for the proposed anesthesia with the patient or authorized representative who has indicated his/her understanding and acceptance.   Dental Advisory Given  Plan Discussed with: Anesthesiologist, CRNA and Surgeon  Anesthesia Plan Comments:         Anesthesia Quick Evaluation

## 2012-05-07 NOTE — Interval H&P Note (Signed)
History and Physical Interval Note:  05/07/2012 7:24 AM  Crystal Simon  has presented today for surgery, with the diagnosis of VV  The various methods of treatment have been discussed with the patient and family. After consideration of risks, benefits and other options for treatment, the patient has consented to  Procedure(s) (LRB): VEIN LIGATION AND STRIPPING (Left) as a surgical intervention .  The patient's history has been reviewed, patient examined, no change in status, stable for surgery.  I have reviewed the patient's chart and labs.  Questions were answered to the patient's satisfaction.     Concepcion Gillott

## 2012-05-07 NOTE — Anesthesia Procedure Notes (Signed)
Procedure Name: LMA Insertion Date/Time: 05/07/2012 1:44 PM Performed by: Gwenyth Allegra Pre-anesthesia Checklist: Patient identified, Timeout performed, Emergency Drugs available and Suction available Patient Re-evaluated:Patient Re-evaluated prior to inductionOxygen Delivery Method: Circle system utilized Preoxygenation: Pre-oxygenation with 100% oxygen Intubation Type: IV induction LMA: LMA inserted LMA Size: 4.0 Number of attempts: 1 Placement Confirmation: breath sounds checked- equal and bilateral and positive ETCO2 Tube secured with: Tape Dental Injury: Teeth and Oropharynx as per pre-operative assessment

## 2012-05-07 NOTE — Transfer of Care (Signed)
Immediate Anesthesia Transfer of Care Note  Patient: Crystal Simon  Procedure(s) Performed: Procedure(s) (LRB): VEIN LIGATION AND STRIPPING (Left)  Patient Location: PACU  Anesthesia Type: General  Level of Consciousness: sedated  Airway & Oxygen Therapy: Patient Spontanous Breathing and Patient connected to nasal cannula oxygen  Post-op Assessment: Report given to PACU RN and Post -op Vital signs reviewed and stable  Post vital signs: Reviewed and stable  Complications: No apparent anesthesia complications

## 2012-05-11 ENCOUNTER — Telehealth: Payer: Self-pay | Admitting: *Deleted

## 2012-05-11 ENCOUNTER — Encounter (HOSPITAL_COMMUNITY): Payer: Self-pay | Admitting: Vascular Surgery

## 2012-05-11 NOTE — Telephone Encounter (Signed)
Ms. Crystal Simon is s/p ligation/stripping of left GSV and stab phlebectomy 05-07-2012.  Returning her phone regarding dressing/compression after surgical dressing was removed.  Conveyed to her Dr. Bosie Helper instructions of wearing ACE wrap to left leg as needed if the compression made her leg feel better.  Conveyed Dr. Bosie Helper instructions of discontinuing ACE wrap if the compression was not helpful or made her leg feel worse.  Ms. Crystal Simon verbalized understanding of instructions.

## 2012-05-24 ENCOUNTER — Encounter: Payer: Self-pay | Admitting: Vascular Surgery

## 2012-05-25 ENCOUNTER — Ambulatory Visit (INDEPENDENT_AMBULATORY_CARE_PROVIDER_SITE_OTHER): Payer: 59 | Admitting: Vascular Surgery

## 2012-05-25 ENCOUNTER — Encounter: Payer: Self-pay | Admitting: Vascular Surgery

## 2012-05-25 VITALS — BP 133/88 | HR 80 | Resp 18 | Ht 69.0 in | Wt 173.6 lb

## 2012-05-25 DIAGNOSIS — I83893 Varicose veins of bilateral lower extremities with other complications: Secondary | ICD-10-CM

## 2012-05-25 NOTE — Progress Notes (Signed)
The patient has today for followup of ligation stripping of her great saphenous vein from her knee to just below her saphenofemoral junction on the left and also stab phlebectomy of multiple tributary varicosities. She had minimal discomfort related to this and minimal bruising. She is having normal healing of her 2 small incisions and all stab sites. She does have a usual thickening under the phlebectomy areas. She is quite pleased with the results as MI. She does wish to have sclerotherapy for appearance reasons and we'll schedule this with Clementeen Hoof, RN in our office at her convenience

## 2012-06-01 ENCOUNTER — Encounter: Payer: Self-pay | Admitting: *Deleted

## 2012-06-02 ENCOUNTER — Ambulatory Visit (INDEPENDENT_AMBULATORY_CARE_PROVIDER_SITE_OTHER): Payer: 59 | Admitting: *Deleted

## 2012-06-02 DIAGNOSIS — I781 Nevus, non-neoplastic: Secondary | ICD-10-CM

## 2012-06-02 DIAGNOSIS — I83893 Varicose veins of bilateral lower extremities with other complications: Secondary | ICD-10-CM

## 2012-06-02 NOTE — Progress Notes (Signed)
X=.3% Sotradecol administered with a 27g butterfly.  Patient received a total of 12cc foam.  Treated as many of her small spiders as 2 syringes would allow. Easy access. Antic good results. She'll need another treatment to get everything but I got the majority of the vessels that bother her. Follow prn.  Photos: yes  Compression stockings applied: yes

## 2012-07-16 ENCOUNTER — Telehealth: Payer: Self-pay | Admitting: *Deleted

## 2012-07-16 NOTE — Telephone Encounter (Signed)
Patient called c/o small area that had received sclerotherapy being red and marble like in center. I spoke with Clementeen Hoof RN vein nurse who had treated patient. She instructed patient to apply heat and to take 200mg  ibuprofen every 4 to 6 hours. She is to call patient Monday morning to follow up. Patient to notify [facility] if worsens before then.

## 2012-07-19 ENCOUNTER — Telehealth: Payer: Self-pay | Admitting: *Deleted

## 2012-07-19 NOTE — Telephone Encounter (Signed)
Called Crystal Simon to see if the area is resolving. She says it has calmed down since taking Ibuprofen and applying heat. She will continue this until the area is clear and no longer red and warm. She seemed reassured.

## 2013-07-19 ENCOUNTER — Encounter: Payer: Self-pay | Admitting: Neurology

## 2013-07-20 ENCOUNTER — Encounter: Payer: Self-pay | Admitting: Neurology

## 2013-07-20 ENCOUNTER — Encounter (INDEPENDENT_AMBULATORY_CARE_PROVIDER_SITE_OTHER): Payer: Self-pay

## 2013-07-20 ENCOUNTER — Ambulatory Visit (INDEPENDENT_AMBULATORY_CARE_PROVIDER_SITE_OTHER): Payer: Federal, State, Local not specified - PPO | Admitting: Neurology

## 2013-07-20 VITALS — BP 141/94 | HR 74 | Ht 68.75 in | Wt 181.0 lb

## 2013-07-20 DIAGNOSIS — R202 Paresthesia of skin: Secondary | ICD-10-CM

## 2013-07-20 DIAGNOSIS — H547 Unspecified visual loss: Secondary | ICD-10-CM

## 2013-07-20 DIAGNOSIS — R209 Unspecified disturbances of skin sensation: Secondary | ICD-10-CM

## 2013-07-20 MED ORDER — ESTRADIOL 0.1 MG/24HR TD PTTW: 1.0000 | MEDICATED_PATCH | TRANSDERMAL | Status: DC

## 2013-07-20 NOTE — Patient Instructions (Signed)
Overall you are doing fairly well but I do want to suggest a few things today:   Remember to drink plenty of fluid, eat healthy meals and do not skip any meals. Try to eat protein with a every meal and eat a healthy snack such as fruit or nuts in between meals. Try to keep a regular sleep-wake schedule and try to exercise daily, particularly in the form of walking, 20-30 minutes a day, if you can.   As far as your medications are concerned, I would like to suggest the following: 1)Please touch base with your primary care doctor in regards to tapering off the steroids. From a neurological standpoint you do not need to be on oral prednisone 2)I restarted your Vivelle  Please schedule a visual evoked response test  I placed a referral for a MRI of the cervical spine and a consult with the eye doctor.  Please drop off your brain MRI pictures.  I would like to see you back in 2 months, sooner if we need to. Please call us with any interim questions, concerns, problems, updates or refill requests.   Please also call us for any test results so we can go over those with you on the phone.  My clinical assistant and will answer any of your questions and relay your messages to me and also relay most of my messages to you.   Our phone number is [phone]. We also have an after hours call service for urgent matters and there is a physician on-call for urgent questions. For any emergencies you know to call 911 or go to the nearest emergency room

## 2013-07-20 NOTE — Progress Notes (Signed)
[facility]    Provider:  Dr Hosie Poisson Referring Provider: Shelle Iron, MD Primary Care Physician:  Shelle Iron, MD  CC:  Possible MS  HPI:  Crystal Simon is a 47 y.o. female here as a referral from Dr. Laymond Purser for vision changes concerning for MS.  Around one week ago had acute onset of double vision, blurry vision and dizziness. Symptoms would briefly come and go. Initially a head CT done which was unremarkable, at followup had a brain MRI done showing question of a brainstem T2 hyperintensity. Patient was started on oral prednisone taper and her home oral patch was stopped. Since taking the prednisone she notes she feels worse, the vision has improved but she does continue to have intermittent blurry vision. She is gaining weight and feels worse overall.   In the past has had brief transient periods of blurry vision. One year ago had episode of vertigo, no testing done at that time, given antivert medication. Will occasionaly have episodes of tingling/paresthesias on R side but told she has a cervical bulging disc.  Has chronic history of headaches, almost daily.  MRI imaging not available, report reviewed shows asymmetric T2 and flair hyperintensity left paramedian midbrain without restricted diffusion or postcontrast enhancement. Also possible small proximal meningioma versus partial volume vascular averaging.  Review of Systems: Out of a complete 14 system review, the patient complains of only the following symptoms, and all other reviewed systems are negative. Other for blurred vision double vision feeling hot feeling cold flushing numbness dizziness  History   Social History  . Marital Status: Married    Spouse Name: Broadus John     Number of Children: 3  . Years of Education: 12   Occupational History  . Not on file.   Social History Main Topics  . Smoking status: Former Games developer  . Smokeless tobacco: Never Used  . Alcohol Use: Yes     Comment: occ rare    . Drug Use: No  . Sexual Activity: Not on file   Other Topics Concern  . Not on file   Social History Narrative   Patient is married Broadus John).   Patient has 3 children.    Patient works at [employer] in [place].   Patient has high school education.     Family History  Problem Relation Age of Onset  . Hypertension Mother   . Alzheimer's disease Maternal Grandmother     No past medical history on file.  Past Surgical History  Procedure Laterality Date  . Vaginal hysterectomy      Current Outpatient Prescriptions  Medication Sig Dispense Refill  . predniSONE (DELTASONE) 20 MG tablet        No current facility-administered medications for this visit.    Allergies as of 07/20/2013  . (No Known Allergies)    Vitals: BP 141/94  Pulse 74  Ht 5' 8.75" (1.746 m)  Wt 181 lb (82.101 kg)  BMI 26.93 kg/m2 Last Weight:  Wt Readings from Last 1 Encounters:  07/20/13 181 lb (82.101 kg)   Last Height:   Ht Readings from Last 1 Encounters:  07/20/13 5' 8.75" (1.746 m)     Physical exam: Exam: Gen: NAD, conversant Eyes: anicteric sclerae, moist conjunctivae HENT: Atraumatic, oropharynx clear Neck: Trachea midline; supple,  Lungs: CTA, no wheezing, rales, rhonic                          CV: RRR, no MRG Abdomen: Soft, non-tender;  Extremities: No peripheral edema  Skin: Normal temperature, no rash,  Psych: Appropriate affect, pleasant  Neuro: MS: AA&Ox3, appropriately interactive, normal affect   Attention: WORLD backwards  Speech: fluent w/o paraphasic error  Memory: good recent and remote recall  CN: PERRL, EOMI no nystagmus, no ptosis, sensation intact to LT V1-V3 bilat, face symmetric, no weakness, hearing grossly intact, palate elevates symmetrically, shoulder shrug 5/5 bilat,  tongue protrudes midline, no fasiculations noted.  Conjunctival injection, restricted R superior VF, unable to fully visualize optic discs bilaterally due to pupil  constriction  Motor: normal bulk and tone Strength: 5/5  In all extremities  Coord: rapid alternating and point-to-point (FNF, HTS) movements intact.  Reflexes: Brisk but symmetrical, bilat downgoing toes  Sens: LT intact in all extremities  Gait: posture, stance, stride and arm-swing normal. Tandem gait intact. Able to walk on heels and toes. Romberg absent.   Assessment:  After physical and neurologic examination, review of laboratory studies, imaging, neurophysiology testing and pre-existing records, assessment will be reviewed on the problem list.  Plan:  Treatment plan and additional workup will be reviewed under Problem List.  1)Visual changes 2)Ataxia 3)Meningioma  47 year old woman sent in for initial evaluation of visual changes , ataxia and MRI finding of a T2 hyperintensity in the midbrain. Based on clinical history and MR findings this potentially could represent a clinically isolated syndrome. Will check visual evoked response, MRI cervical spine. Will refer patient to ophthalmology for formal eye exam. Do not feel there is a benefit to oral steroids at this time, instructed patient to contact primary care physician to discuss tapering off. Patient to followup once workup completed. If further lesions are found we'll consider starting immunomodulatory therapy. Restarted vivelle. Will followup with routine MRIs 6 months to monitor possible meningioma.

## 2013-07-27 ENCOUNTER — Ambulatory Visit (INDEPENDENT_AMBULATORY_CARE_PROVIDER_SITE_OTHER): Payer: Federal, State, Local not specified - PPO

## 2013-07-27 DIAGNOSIS — R202 Paresthesia of skin: Secondary | ICD-10-CM

## 2013-07-27 DIAGNOSIS — H547 Unspecified visual loss: Secondary | ICD-10-CM

## 2013-07-27 NOTE — Procedures (Signed)
    History:   Crystal Simon is a 47 year old patient with a history of double vision, blurry vision, and dizziness. There has been a questionable abnormality in the brainstem by MRI. The patient is being evaluated for possible optic nerve dysfunction.  Description: The visual evoked response test was performed today using 32 x 32 check sizes. The absolute latencies for the N1 and the P100 wave forms were within normal limits bilaterally. The amplitudes for the P100 wave forms were also within normal limits bilaterally. The visual acuity was 20/20 OD and 20/20 OS uncorrected.  Impression:  The visual evoked response test above was within normal limits bilaterally. No evidence of conduction slowing was seen within the anterior visual pathways on either side on today's evaluation.

## 2013-07-28 NOTE — Progress Notes (Signed)
Quick Note:  Shared visual evoked potential test with patient per Dr Minus Breeding findings, patient understood ______

## 2013-08-01 DIAGNOSIS — R209 Unspecified disturbances of skin sensation: Secondary | ICD-10-CM

## 2013-08-02 ENCOUNTER — Ambulatory Visit
Admission: RE | Admit: 2013-08-02 | Discharge: 2013-08-02 | Disposition: A | Discharge status: Home / Self Care | Payer: Federal, State, Local not specified - PPO | Source: Ambulatory Visit | Attending: Neurology | Admitting: Neurology

## 2013-08-02 ENCOUNTER — Encounter: Payer: Self-pay | Admitting: Vascular Surgery

## 2013-08-02 DIAGNOSIS — H547 Unspecified visual loss: Secondary | ICD-10-CM

## 2013-08-02 DIAGNOSIS — R202 Paresthesia of skin: Secondary | ICD-10-CM

## 2013-08-02 MED ORDER — GADOBENATE DIMEGLUMINE 529 MG/ML IV SOLN
15.0000 mL | Freq: Once | INTRAVENOUS | Status: AC | PRN
  Administered 2013-08-02: 15 mL via INTRAVENOUS

## 2013-08-25 ENCOUNTER — Ambulatory Visit: Payer: Federal, State, Local not specified - PPO | Admitting: Gynecology

## 2013-09-15 ENCOUNTER — Ambulatory Visit: Payer: Federal, State, Local not specified - PPO | Admitting: Gynecology

## 2013-09-19 ENCOUNTER — Ambulatory Visit (INDEPENDENT_AMBULATORY_CARE_PROVIDER_SITE_OTHER): Payer: Federal, State, Local not specified - PPO | Admitting: Neurology

## 2013-09-19 ENCOUNTER — Encounter (INDEPENDENT_AMBULATORY_CARE_PROVIDER_SITE_OTHER): Payer: Self-pay

## 2013-09-19 ENCOUNTER — Encounter: Payer: Self-pay | Admitting: Neurology

## 2013-09-19 VITALS — BP 146/92 | HR 95 | Ht 69.0 in | Wt 185.0 lb

## 2013-09-19 DIAGNOSIS — D329 Benign neoplasm of meninges, unspecified: Secondary | ICD-10-CM

## 2013-09-19 DIAGNOSIS — R270 Ataxia, unspecified: Secondary | ICD-10-CM

## 2013-09-19 DIAGNOSIS — H539 Unspecified visual disturbance: Secondary | ICD-10-CM

## 2013-09-19 DIAGNOSIS — R279 Unspecified lack of coordination: Secondary | ICD-10-CM

## 2013-09-19 DIAGNOSIS — D320 Benign neoplasm of cerebral meninges: Secondary | ICD-10-CM

## 2013-09-19 DIAGNOSIS — M542 Cervicalgia: Secondary | ICD-10-CM

## 2013-09-19 NOTE — Progress Notes (Signed)
[facility]    Provider:  Dr Janann Colonel Referring Provider: Charlotte Sanes, MD Primary Care Physician:  Charlotte Sanes, MD  CC:  Possible MS  HPI:  Crystal Simon is a 48 y.o. female here as a follow up from Dr. Spero Curb for vision changes concerning for MS. Initial visit was 07/2013 at which time she had a VER, MRI cervical spine and was referred to the eye doctor. MRI and VER were overall unremarkable. Overall reports she is doing well. Main concern is frequent headaches and episodes of sensation of vertigo. No acute neurological concerns at this time.    Initial visit 07/2013 Around one week ago had acute onset of double vision, blurry vision and dizziness. Symptoms would briefly come and go. Initially a head CT done which was unremarkable, at followup had a brain MRI done showing question of a brainstem T2 hyperintensity. Patient was started on oral prednisone taper and her home oral patch was stopped. Since taking the prednisone she notes she feels worse, the vision has improved but she does continue to have intermittent blurry vision. She is gaining weight and feels worse overall.   In the past has had brief transient periods of blurry vision. One year ago had episode of vertigo, no testing done at that time, given antivert medication. Will occasionaly have episodes of tingling/paresthesias on R side but told she has a cervical bulging disc.  Has chronic history of headaches, almost daily.  MRI imaging not available, report reviewed shows asymmetric T2 and flair hyperintensity left paramedian midbrain without restricted diffusion or postcontrast enhancement. Also possible small proximal meningioma versus partial volume vascular averaging.  Review of Systems: Out of a complete 14 system review, the patient complains of only the following symptoms, and all other reviewed systems are negative. Positive neck pain neck stiffness nausea aching muscles dizziness headache  numbness  History   Social History  . Marital Status: Married    Spouse Name: Crystal Simon     Number of Children: 3  . Years of Education: 12   Occupational History  . Not on file.   Social History Main Topics  . Smoking status: Current Every Day Smoker    Last Attempt to Quit: 09/08/1994  . Smokeless tobacco: Never Used  . Alcohol Use: 3.0 oz/week    5 Glasses of wine per week     Comment: occ rare  . Drug Use: No  . Sexual Activity: Not on file   Other Topics Concern  . Not on file   Social History Narrative   ** Merged History Encounter **       ** Data from: 05/25/12 Enc Dept: [facility]       ** Data from: 07/20/13 Enc Dept: [facility]   Patient is married Crystal Simon).   Patient has 3 children.    Patient works at [employer] in [place].   Patient has high school education.   Caffeine consumption two cups daily        Family History  Problem Relation Age of Onset  . Heart disease Father   . Hypertension Mother   . Alzheimer's disease Maternal Grandmother     Past Medical History  Diagnosis Date  . Hypertension   . Heart palpitations   . Varicose veins   . Headache(784.0)   . Bulging discs 1998    cervical  . Gilbert syndrome since late teens  . Complication of anesthesia   . PONV (postoperative nausea and vomiting)   . Constipation  Past Surgical History  Procedure Laterality Date  . Abdominal hysterectomy    . Endovenous ablation saphenous vein w/ laser  summer 2011    right and left greater saphenous veins ([place], [place])    . Tubal ligation    . Dilation and curettage of uterus    . Vein ligation and stripping  05/07/2012    Procedure: VEIN LIGATION AND STRIPPING;  Surgeon: Rosetta Posner, MD;  Location: [facility];  Service: Vascular;  Laterality: Left;  WITH STAB PHELBECTOMY  . Vaginal hysterectomy      Current Outpatient Prescriptions  Medication Sig Dispense Refill  . cefdinir (OMNICEF) 300 MG capsule Take 300 mg by mouth 2 (two) times  daily.      Marland Kitchen estradiol (VIVELLE-DOT) 0.1 MG/24HR patch Place 1 patch (0.1 mg total) onto the skin 2 (two) times a week.  8 patch  12  . ibuprofen (ADVIL,MOTRIN) 200 MG tablet Take 400 mg by mouth every 6 (six) hours as needed. For pain       No current facility-administered medications for this visit.    Allergies as of 09/19/2013 - Review Complete 09/19/2013  Allergen Reaction Noted  . Prednisone  04/20/2012    Vitals: BP 146/92  Pulse 95  Ht 5\' 9"  (1.753 m)  Wt 185 lb (83.915 kg)  BMI 27.31 kg/m2 Last Weight:  Wt Readings from Last 1 Encounters:  09/19/13 185 lb (83.915 kg)   Last Height:   Ht Readings from Last 1 Encounters:  09/19/13 5\' 9"  (1.753 m)     Physical exam: Exam: Gen: NAD, conversant Eyes: anicteric sclerae, moist conjunctivae HENT: Atraumatic, oropharynx clear Neck: Trachea midline; supple,  Lungs: CTA, no wheezing, rales, rhonic                          CV: RRR, no MRG Abdomen: Soft, non-tender;  Extremities: No peripheral edema  Skin: Normal temperature, no rash,  Psych: Appropriate affect, pleasant  Neuro: MS: AA&Ox3, appropriately interactive, normal affect   Speech: fluent w/o paraphasic error  Memory: good recent and remote recall  CN: PERRL, EOMI no nystagmus, no ptosis, sensation intact to LT V1-V3 bilat, face symmetric, no weakness, hearing grossly intact, palate elevates symmetrically, shoulder shrug 5/5 bilat,  tongue protrudes midline, no fasiculations noted.  Conjunctival injection, restricted R superior VF, unable to fully visualize optic discs bilaterally due to pupil constriction  Motor: normal bulk and tone Strength: 5/5  In all extremities  Coord: rapid alternating and point-to-point (FNF, HTS) movements intact.  Reflexes: Brisk but symmetrical, bilat downgoing toes  Sens: LT intact in all extremities  Gait: posture, stance, stride and arm-swing normal. Tandem gait intact. Able to walk on heels and toes. Romberg  absent.   Assessment:  After physical and neurologic examination, review of laboratory studies, imaging, neurophysiology testing and pre-existing records, assessment will be reviewed on the problem list.  Plan:  Treatment plan and additional workup will be reviewed under Problem List.  1)Visual changes 2)Ataxia 3)Meningioma  48 year old woman sent in for follow up evaluation of visual changes , ataxia and MRI finding of a T2 hyperintensity in the midbrain. Based on clinical history and MR findings this potentially could represent a clinically isolated syndrome. Cervical spine MRI and VER do not show any further signs of MS. extensively discussed different options with patient, will refer patient for lumbar puncture for further diagnostic workup for possible multiple sclerosis. Follow up once lumbar puncture completed. Will refer  patient to physical therapy for history of chronic neck pain. Will followup with routine MRIs 6 months to monitor possible meningioma.

## 2013-09-22 ENCOUNTER — Other Ambulatory Visit: Payer: Self-pay | Admitting: Neurology

## 2013-09-22 ENCOUNTER — Ambulatory Visit
Admission: RE | Admit: 2013-09-22 | Discharge: 2013-09-22 | Disposition: A | Discharge status: Home / Self Care | Payer: Federal, State, Local not specified - PPO | Source: Ambulatory Visit | Attending: Neurology | Admitting: Neurology

## 2013-09-22 VITALS — BP 115/71 | HR 69

## 2013-09-22 DIAGNOSIS — G35 Multiple sclerosis: Secondary | ICD-10-CM

## 2013-09-22 DIAGNOSIS — H539 Unspecified visual disturbance: Secondary | ICD-10-CM

## 2013-09-22 DIAGNOSIS — M542 Cervicalgia: Secondary | ICD-10-CM

## 2013-09-22 DIAGNOSIS — R270 Ataxia, unspecified: Secondary | ICD-10-CM

## 2013-09-22 DIAGNOSIS — I83893 Varicose veins of bilateral lower extremities with other complications: Secondary | ICD-10-CM

## 2013-09-22 LAB — PROTEIN, CSF: Total Protein, CSF: 33 mg/dL (ref 15–45)

## 2013-09-22 LAB — CSF CELL COUNT WITH DIFFERENTIAL
RBC COUNT CSF: 11 uL — AB
TUBE #: 1
WBC, CSF: 1 cu mm (ref 0–5)

## 2013-09-22 LAB — GLUCOSE, CSF: Glucose, CSF: 56 mg/dL (ref 43–76)

## 2013-09-22 MED ORDER — DIAZEPAM 5 MG PO TABS
10.0000 mg | ORAL_TABLET | Freq: Once | ORAL | Status: AC
  Administered 2013-09-22: 10 mg via ORAL

## 2013-09-22 NOTE — Progress Notes (Signed)
Dr. Gerilyn Pilgrim in to explain procedure to patient, answer her questions and obtain consent.  jkl

## 2013-09-22 NOTE — Discharge Instructions (Signed)
Lumbar Puncture Discharge Instructions ° °1. Go home and rest quietly for the next 24 hours.  It is important to lie flat for the next 24 hours.  Get up only to go to the restroom.  You may lie in the bed or on a couch on your back, your stomach, your left side or your right side.  You may have one pillow under your head.  You may have pillows between your knees while you are on your side or under your knees while you are on your back. ° °2. DO NOT drive today.  Recline the seat as far back as it will go, while still wearing your seat belt, on the way home. ° °3. You may get up to go to the bathroom as needed.  You may sit up for 10 minutes to eat.  You may resume your normal diet and medications unless otherwise indicated.  Drink plenty of extra fluids today and tomorrow. ° °4. The incidence of a spinal headache with nausea and/or vomiting is about 5% (one in 20 patients).  If you develop a headache, lie flat and drink plenty of fluids until the headache goes away.  Caffeinated beverages may be helpful.  If you develop severe nausea and vomiting or a headache that does not go away with flat bed rest, call [phone]. ° °5. You may resume normal activities after your 24 hours of bed rest is over; however, do not exert yourself strongly or do any heavy lifting tomorrow. ° °6. Call your physician for a follow-up appointment.  °

## 2013-09-25 ENCOUNTER — Emergency Department (HOSPITAL_COMMUNITY)
Admission: EM | Admit: 2013-09-25 | Discharge: 2013-09-26 | Disposition: A | Discharge status: Home / Self Care | Payer: Federal, State, Local not specified - PPO | Attending: Emergency Medicine | Admitting: Emergency Medicine

## 2013-09-25 ENCOUNTER — Encounter (HOSPITAL_COMMUNITY): Payer: Self-pay | Admitting: Emergency Medicine

## 2013-09-25 DIAGNOSIS — R42 Dizziness and giddiness: Secondary | ICD-10-CM | POA: Insufficient documentation

## 2013-09-25 DIAGNOSIS — Z888 Allergy status to other drugs, medicaments and biological substances status: Secondary | ICD-10-CM | POA: Insufficient documentation

## 2013-09-25 DIAGNOSIS — I8390 Asymptomatic varicose veins of unspecified lower extremity: Secondary | ICD-10-CM | POA: Insufficient documentation

## 2013-09-25 DIAGNOSIS — R1110 Vomiting, unspecified: Secondary | ICD-10-CM | POA: Insufficient documentation

## 2013-09-25 DIAGNOSIS — I10 Essential (primary) hypertension: Secondary | ICD-10-CM | POA: Insufficient documentation

## 2013-09-25 DIAGNOSIS — F17200 Nicotine dependence, unspecified, uncomplicated: Secondary | ICD-10-CM | POA: Insufficient documentation

## 2013-09-25 DIAGNOSIS — Z8669 Personal history of other diseases of the nervous system and sense organs: Secondary | ICD-10-CM | POA: Insufficient documentation

## 2013-09-25 DIAGNOSIS — G971 Other reaction to spinal and lumbar puncture: Secondary | ICD-10-CM | POA: Insufficient documentation

## 2013-09-25 DIAGNOSIS — R002 Palpitations: Secondary | ICD-10-CM | POA: Insufficient documentation

## 2013-09-25 DIAGNOSIS — Z79899 Other long term (current) drug therapy: Secondary | ICD-10-CM | POA: Insufficient documentation

## 2013-09-25 DIAGNOSIS — K5900 Constipation, unspecified: Secondary | ICD-10-CM | POA: Insufficient documentation

## 2013-09-25 DIAGNOSIS — IMO0002 Reserved for concepts with insufficient information to code with codable children: Secondary | ICD-10-CM | POA: Insufficient documentation

## 2013-09-25 LAB — CSF CULTURE W GRAM STAIN
Gram Stain: NONE SEEN
Organism ID, Bacteria: NO GROWTH

## 2013-09-25 LAB — CNS IGG SYNTHESIS RATE, CSF+BLOOD
ALBUMIN, SERUM(NEPH): 4.3 g/dL (ref 3.5–4.9)
Albumin, CSF: 14.8 mg/dL (ref 8.0–42.0)
IGG INDEX, CSF: 0.52 (ref <0.66)
IgG, CSF: 1.6 mg/dL (ref 0.8–7.7)
IgG, Serum: 894 mg/dL (ref 694–1618)
MS CNS IgG Synthesis Rate: -2.4 mg/24 h (ref <=3.3)

## 2013-09-25 LAB — CSF CULTURE: GRAM STAIN: NONE SEEN

## 2013-09-25 MED ORDER — ONDANSETRON 4 MG PO TBDP
8.0000 mg | ORAL_TABLET | Freq: Once | ORAL | Status: AC
  Administered 2013-09-25: 8 mg via ORAL
  Filled 2013-09-25: qty 2

## 2013-09-25 NOTE — ED Notes (Signed)
The pt is c/o a headache since Thursday after she had a spinal tap at [facility].  n v since

## 2013-09-26 ENCOUNTER — Other Ambulatory Visit: Payer: Self-pay | Admitting: Emergency Medicine

## 2013-09-26 ENCOUNTER — Telehealth: Payer: Self-pay | Admitting: Radiology

## 2013-09-26 ENCOUNTER — Ambulatory Visit
Admission: RE | Admit: 2013-09-26 | Discharge: 2013-09-26 | Disposition: A | Discharge status: Home / Self Care | Payer: Federal, State, Local not specified - PPO | Source: Ambulatory Visit | Attending: Emergency Medicine | Admitting: Emergency Medicine

## 2013-09-26 ENCOUNTER — Emergency Department (HOSPITAL_COMMUNITY): Payer: Federal, State, Local not specified - PPO

## 2013-09-26 DIAGNOSIS — G971 Other reaction to spinal and lumbar puncture: Secondary | ICD-10-CM

## 2013-09-26 LAB — BASIC METABOLIC PANEL
BUN: 9 mg/dL (ref 6–23)
CHLORIDE: 104 meq/L (ref 96–112)
CO2: 25 meq/L (ref 19–32)
CREATININE: 0.73 mg/dL (ref 0.50–1.10)
Calcium: 9.3 mg/dL (ref 8.4–10.5)
GFR calc Af Amer: >90 mL/min (ref >90)
GFR calc non Af Amer: >90 mL/min (ref >90)
GLUCOSE: 100 mg/dL — AB (ref 70–99)
Potassium: 4 mEq/L (ref 3.7–5.3)
Sodium: 143 mEq/L (ref 137–147)

## 2013-09-26 LAB — CBC WITH DIFFERENTIAL/PLATELET
Basophils Absolute: 0.1 10*3/uL (ref 0.0–0.1)
Basophils Relative: 1 % (ref 0–1)
Eosinophils Absolute: 0.1 10*3/uL (ref 0.0–0.7)
Eosinophils Relative: 1 % (ref 0–5)
HEMATOCRIT: 38.9 % (ref 36.0–46.0)
Hemoglobin: 13.7 g/dL (ref 12.0–15.0)
LYMPHS PCT: 19 % (ref 12–46)
Lymphs Abs: 1.7 10*3/uL (ref 0.7–4.0)
MCH: 30.3 pg (ref 26.0–34.0)
MCHC: 35.2 g/dL (ref 30.0–36.0)
MCV: 86.1 fL (ref 78.0–100.0)
MONO ABS: 0.6 10*3/uL (ref 0.1–1.0)
Monocytes Relative: 6 % (ref 3–12)
NEUTROS ABS: 6.3 10*3/uL (ref 1.7–7.7)
Neutrophils Relative %: 73 % (ref 43–77)
Platelets: 300 10*3/uL (ref 150–400)
RBC: 4.52 MIL/uL (ref 3.87–5.11)
RDW: 12.4 % (ref 11.5–15.5)
WBC: 8.7 10*3/uL (ref 4.0–10.5)

## 2013-09-26 MED ORDER — METOCLOPRAMIDE HCL 10 MG PO TABS: 10.0000 mg | ORAL_TABLET | Freq: Four times a day (QID) | ORAL | Status: DC | PRN

## 2013-09-26 MED ORDER — METOCLOPRAMIDE HCL 5 MG/ML IJ SOLN
10.0000 mg | Freq: Once | INTRAMUSCULAR | Status: AC
  Administered 2013-09-26: 10 mg via INTRAVENOUS
  Filled 2013-09-26: qty 2

## 2013-09-26 MED ORDER — ONDANSETRON 4 MG PO TBDP: ORAL_TABLET | ORAL | Status: DC

## 2013-09-26 MED ORDER — IOHEXOL 180 MG/ML  SOLN: 1.0000 mL | Freq: Once | INTRAMUSCULAR | Status: AC | PRN

## 2013-09-26 MED ORDER — CAFFEINE-SODIUM BENZOATE 125-125 MG/ML IJ SOLN: 500.0000 mg | Freq: Once | INTRAMUSCULAR | Status: DC

## 2013-09-26 MED ORDER — HYDROMORPHONE HCL PF 1 MG/ML IJ SOLN
1.0000 mg | Freq: Once | INTRAMUSCULAR | Status: AC
  Administered 2013-09-26: 1 mg via INTRAVENOUS
  Filled 2013-09-26: qty 1

## 2013-09-26 MED ORDER — SODIUM CHLORIDE 0.9 % IV BOLUS (SEPSIS)
1000.0000 mL | Freq: Once | INTRAVENOUS | Status: AC
  Administered 2013-09-26: 1000 mL via INTRAVENOUS

## 2013-09-26 MED ORDER — SODIUM CHLORIDE 0.9 % IV SOLN
Freq: Once | INTRAVENOUS | Status: DC
  Filled 2013-09-26: qty 1000

## 2013-09-26 MED ORDER — HYDROCORTISONE SOD SUCCINATE 100 MG IJ SOLR
50.0000 mg | Freq: Once | INTRAMUSCULAR | Status: AC
  Administered 2013-09-26: 50 mg via INTRAVENOUS
  Filled 2013-09-26: qty 2

## 2013-09-26 NOTE — Discharge Instructions (Signed)
Blood Patch Discharge Instructions ° °1. Go home and rest quietly for the next 24 hours.  It is important to lie flat for the next 24 hours.  Get up only to go to the restroom.  You may lie in the bed or on a couch on your back, your stomach, your left side or your right side.  You may have one pillow under your head.  You may have pillows between your knees while you are on your side or under your knees while you are on your back. ° °2. DO NOT drive today.  Recline the seat as far back as it will go, while still wearing your seat belt, on the way home. ° °3. You may get up to go to the bathroom as needed.  You may sit up for 10 minutes to eat.  You may resume your normal diet and medications unless otherwise indicated.  Drink lots of extra fluids today and tomorrow ° °4. You may resume normal activities after your 24 hours of bed rest is over; however, do not exert yourself strongly or do any heavy lifting tomorrow. ° °5. Call your physician for a follow-up appointment.  ° °6. If you have any questions or if complications develop after you arrive home, please call [phone]. ° °Discharge instructions have been explained to the patient.  The patient, or the person responsible for the patient, fully understands these instructions. °

## 2013-09-26 NOTE — ED Provider Notes (Signed)
CSN: [id number]     Arrival date & time 09/25/13  2323 History   First MD Initiated Contact with Patient 09/25/13 2349     Chief Complaint  Patient presents with  . spinal headache    (Consider location/radiation/quality/duration/timing/severity/associated sxs/prior Treatment) HPI Comments: 48 yo female with HTN, Gilbert, spinal HA hx presents with general HA and upper neck pain since LP on Friday done at [facility] for [place] workup.  HA constant since, worse with movement.  No fevers or back pain.  N/V since.  Similar to previous LP HA.  Nothing improves.. Seen at outside ER, given narcotics, no improvement.  The history is provided by the patient.    Past Medical History  Diagnosis Date  . Hypertension   . Heart palpitations   . Varicose veins   . Headache(784.0)   . Bulging discs 1998    cervical  . Gilbert syndrome since late teens  . Complication of anesthesia   . PONV (postoperative nausea and vomiting)   . Constipation    Past Surgical History  Procedure Laterality Date  . Abdominal hysterectomy    . Endovenous ablation saphenous vein w/ laser  summer 2011    right and left greater saphenous veins ([place], [place])    . Tubal ligation    . Dilation and curettage of uterus    . Vein ligation and stripping  05/07/2012    Procedure: VEIN LIGATION AND STRIPPING;  Surgeon: Rosetta Posner, MD;  Location: [facility];  Service: Vascular;  Laterality: Left;  WITH STAB PHELBECTOMY  . Vaginal hysterectomy     Family History  Problem Relation Age of Onset  . Heart disease Father   . Hypertension Mother   . Alzheimer's disease Maternal Grandmother    History  Substance Use Topics  . Smoking status: Current Every Day Smoker    Last Attempt to Quit: 09/08/1994  . Smokeless tobacco: Never Used  . Alcohol Use: 3.0 oz/week    5 Glasses of wine per week     Comment: occ rare   OB History   Grav Para Term Preterm Abortions TAB SAB Ect Mult Living                 Review of  Systems  Constitutional: Negative for fever and chills.  HENT: Negative for congestion.   Eyes: Negative for visual disturbance.  Respiratory: Negative for shortness of breath.   Cardiovascular: Negative for chest pain.  Gastrointestinal: Positive for nausea and vomiting. Negative for abdominal pain.  Genitourinary: Negative for dysuria and flank pain.  Musculoskeletal: Negative for back pain, neck pain and neck stiffness.  Skin: Negative for rash.  Neurological: Positive for light-headedness and headaches.    Allergies  Prednisone  Home Medications   Current Outpatient Rx  Name  Route  Sig  Dispense  Refill  . cefdinir (OMNICEF) 300 MG capsule   Oral   Take 300 mg by mouth 2 (two) times daily.         Marland Kitchen estradiol (VIVELLE-DOT) 0.1 MG/24HR patch   Transdermal   Place 1 patch (0.1 mg total) onto the skin 2 (two) times a week.   8 patch   12   . ibuprofen (ADVIL,MOTRIN) 200 MG tablet   Oral   Take 400 mg by mouth every 6 (six) hours as needed. For pain          BP 141/78  Pulse 74  Temp(Src) 98.3 F (36.8 C)  Resp 22  Ht 5'  9" (1.753 m)  Wt 187 lb (84.823 kg)  BMI 27.60 kg/m2  SpO2 99% Physical Exam  Nursing note and vitals reviewed. Constitutional: She is oriented to person, place, and time. She appears well-developed and well-nourished.  HENT:  Head: Normocephalic and atraumatic.  Eyes: Conjunctivae are normal. Right eye exhibits no discharge. Left eye exhibits no discharge.  Neck: Normal range of motion. Neck supple. No tracheal deviation present.  Cardiovascular: Normal rate and regular rhythm.   Pulmonary/Chest: Effort normal and breath sounds normal.  Abdominal: Soft. She exhibits no distension. There is no tenderness. There is no guarding.  Musculoskeletal: She exhibits no edema.  Neurological: She is alert and oriented to person, place, and time. No cranial nerve deficit. Coordination normal. GCS eye subscore is 4. GCS verbal subscore is 5. GCS motor  subscore is 6.  5+ strength in UE and LE with f/e at major joints. Sensation to palpation intact in UE and LE. CNs 2-12 grossly intact.  EOMFI.  PERRL.   Finger nose and coordination intact bilateral.   Visual fields intact to finger testing. Neck supple, no meningismus  Skin: Skin is warm. No rash noted.  Psychiatric: She has a normal mood and affect.    ED Course  Procedures (including critical care time) Labs Review Labs Reviewed  BASIC METABOLIC PANEL - Abnormal; Notable for the following:    Glucose, Bld 100 (*)    All other components within normal limits  CBC WITH DIFFERENTIAL   Imaging Review Ct Head Wo Contrast  09/26/2013   CLINICAL DATA:  Headache since Thursday.  Recent lumbar puncture.  EXAM: CT HEAD WITHOUT CONTRAST  TECHNIQUE: Contiguous axial images were obtained from the base of the skull through the vertex without intravenous contrast.  COMPARISON:  CT HEAD W/O CM dated 07/11/2013; MR C SPINE WO/W CM dated 08/02/2013; MR HEAD WO/W CM dated 07/12/2013  FINDINGS: Sinuses/Soft tissues: Hypoplastic frontal sinuses. Other paranasal sinuses and mastoid air cells normal.  Intracranial: No mass lesion, hemorrhage, hydrocephalus, acute infarct, intra-axial, or extra-axial fluid collection.  IMPRESSION: Normal head CT.   Electronically Signed   By: Abigail Miyamoto M.D.   On: 09/26/2013 01:38    EKG Interpretation   None       MDM   1. Spinal headache   2. Vomiting    Clinically spinal HA.  With worsening sxs and vomiting/ dehydration plan for pain meds, fluids, CT head And anesthesia consult. No concern for meningitis at this time. Spoke with anesthesia,  Unable to do it tonight, rec going to imaging center where she had it done on mon or Tue. Caffeine ordered, no IV caffeine.  Reports of steroids helping sxs, discussed r/b with pt, agreed to try solumedrol Antiemetic repeated. Pt improved on recheck, comfortable going home.  Fup with [facility] discussed.    Results and differential diagnosis were discussed with the patient. Close follow up outpatient was discussed, patient comfortable with the plan.         Mariea Clonts, MD 09/26/13 [phone]

## 2013-09-26 NOTE — Progress Notes (Signed)
1142 blood drawn from right [facility] for blood patch, site unremarkable and pt tolerated procedure well. 20 cc of blood obtained.

## 2013-09-26 NOTE — ED Notes (Signed)
Patient transported to CT 

## 2013-09-26 NOTE — Discharge Instructions (Signed)
Try caffeine for worsening symptoms. Take nausea medicines as needed. Call radiology group you had your LP done at for blood patch.  If you were given medicines take as directed.  If you are on coumadin or contraceptives realize their levels and effectiveness is altered by many different medicines.  If you have any reaction (rash, tongues swelling, other) to the medicines stop taking and see a physician.   Please follow up as directed and return to the ER or see a physician for new or worsening symptoms.  Thank you.

## 2013-09-26 NOTE — ED Notes (Signed)
Patient back to room from CT. Family at bedside. Assisted patient to use bedside commode.  Activity led to nausea.  MD notified.

## 2013-09-26 NOTE — Telephone Encounter (Signed)
Pt had LP on 09/22/13 and was in the ER last night with headache and nausea and vomiting. Both were severe. Anesthesia was unable to do blood patch. Pt was told to call us and come here today for blood patch. Explained we had to have an order and would call Dr. Janann Colonel.  Dr. Hazle Quant office called and message left for Butch Penny re: blood patch order ASAP.dd

## 2013-09-27 LAB — OLIGOCLONAL BANDS, CSF + SERM

## 2013-09-27 LAB — MYELIN BASIC PROTEIN, CSF

## 2013-09-28 LAB — NEUROMYELITIS OPTICA AUTOAB, IGG: NMO-IGG: NEGATIVE

## 2013-09-28 LAB — HSV(HERPES SMPLX VRS)ABS-I+II(IGG)-CSF

## 2013-09-30 ENCOUNTER — Telehealth: Payer: Self-pay | Admitting: *Deleted

## 2013-09-30 NOTE — Telephone Encounter (Signed)
Called patient to schedule follow up appointment to discuss LP results and next steps. Patient did not answer left a message for her to call the office back to schedule.

## 2013-09-30 NOTE — Telephone Encounter (Signed)
Message copied by Vivi Barrack on Fri Sep 30, 2013  3:44 PM ------      Message from: Drema Dallas      Created: Fri Sep 30, 2013  3:34 PM       Hi. Please scheduled her for a follow up appointment to discuss her LP results and next steps. Thanks. ------

## 2013-10-03 ENCOUNTER — Telehealth: Payer: Self-pay | Admitting: Diagnostic Neuroimaging

## 2013-10-03 NOTE — Telephone Encounter (Signed)
HAS SCHEDULED APPT FOR 1-30 BUT HAS QUESTIONS ABOUT LP BEFORE COMING IN.

## 2013-10-03 NOTE — Telephone Encounter (Signed)
Patient is upset that she did not get a personal call about her LP results. Patient would like a call to her husband at [phone], concerning this matter. Patient has appt. Set up for 10/05/13 at 2:30 pm. Please advise.

## 2013-10-03 NOTE — Telephone Encounter (Signed)
Patient was promised a personal phone call regarding LP results but did not hear back on Friday and has been worried about the results all weekend. Patient is going to work at 3:15 and will not be able to answer her phone, but has given permission to give the results to husband. Please call the patient as soon as possible, they are upset that they did not get the promised personal call and how the voicemail message that was left caused her to worry.

## 2013-10-03 NOTE — Telephone Encounter (Signed)
Patient's husband can be reached at [phone].

## 2013-10-03 NOTE — Telephone Encounter (Signed)
Patients husband called in for CSF results. I gave results (initial [facility] report negative, but second report corrected to show 3 [facility] ins CSF; other labs unremarkable). Could be supportive of MS vs CIS, but official dx to be discussed by primary neurologist. advised to keep follow up appt with Dr. Janann Colonel. Also I corrected phone number in system.   Penni Bombard, MD [id number]/9767, 3:41 PM Certified in Neurology, Neurophysiology and Neuroimaging  [facility] [address] [place], [place] [address] [phone]

## 2013-10-03 NOTE — Telephone Encounter (Signed)
Returned call to [phone], number goes to [facility], they state that they do not have a "Mr Dollinger" on staff. Attempted to call husbands number listed under demographics ([phone]). No answer so message left. Tried patients cell phone with no answer.

## 2013-10-03 NOTE — Telephone Encounter (Signed)
Pt called with the correct phone number for her husband, Cletus Gash.  The number is [phone].  He is waiting for your call.  I have corrected this information in the patient's chart.  Thank you

## 2013-10-04 NOTE — Telephone Encounter (Signed)
Patient called with correct number for her husband, which is [phone]. Patient appt is tomorrow at 2:30 pm. Did not know if you wanted to give him a call. Please advise.

## 2013-10-05 ENCOUNTER — Ambulatory Visit (INDEPENDENT_AMBULATORY_CARE_PROVIDER_SITE_OTHER): Payer: Federal, State, Local not specified - PPO | Admitting: Neurology

## 2013-10-05 ENCOUNTER — Ambulatory Visit: Payer: Federal, State, Local not specified - PPO | Admitting: Women's Health

## 2013-10-05 ENCOUNTER — Encounter: Payer: Self-pay | Admitting: Neurology

## 2013-10-05 ENCOUNTER — Ambulatory Visit: Payer: Federal, State, Local not specified - PPO | Admitting: Neurology

## 2013-10-05 VITALS — BP 144/88 | HR 93 | Ht 69.0 in | Wt 189.0 lb

## 2013-10-05 DIAGNOSIS — G35 Multiple sclerosis: Secondary | ICD-10-CM

## 2013-10-05 DIAGNOSIS — R1310 Dysphagia, unspecified: Secondary | ICD-10-CM

## 2013-10-05 MED ORDER — DIMETHYL FUMARATE 240 MG PO CPDR: 240.0000 mg | DELAYED_RELEASE_CAPSULE | Freq: Two times a day (BID) | ORAL | Status: DC

## 2013-10-05 NOTE — Progress Notes (Signed)
[facility]    Provider:  Dr Janann Colonel Referring Provider: Charlotte Sanes, MD Primary Care Physician:  Charlotte Sanes, MD  CC:  Possible MS  HPI:  Crystal Simon is a 48 y.o. female here as a follow up from Dr. Spero Curb for vision changes concerning for MS. Most recent visit was 09/19/2012 at which time an LP was ordered. LP results + for 3 oligoclonal bands, otherwise unremarkable. Patient returns today to discuss the results. Main concerns are neck pain, difficulty swallowing and paresthesias in the RUE.    Initial visit 07/2013 Around one week ago had acute onset of double vision, blurry vision and dizziness. Symptoms would briefly come and go. Initially a head CT done which was unremarkable, at followup had a brain MRI done showing question of a brainstem T2 hyperintensity. Patient was started on oral prednisone taper and her home oral patch was stopped. Since taking the prednisone she notes she feels worse, the vision has improved but she does continue to have intermittent blurry vision. She is gaining weight and feels worse overall.   Review of Systems: Out of a complete 14 system review, the patient complains of only the following symptoms, and all other reviewed systems are negative. Positive neck pain neck stiffness nausea aching muscles dizziness headache numbness  History   Social History  . Marital Status: Married    Spouse Name: Crystal Simon     Number of Children: 3  . Years of Education: 12   Occupational History  . Not on file.   Social History Main Topics  . Smoking status: Former Smoker    Quit date: 09/08/1994  . Smokeless tobacco: Never Used     Comment: Quit 09/2013  . Alcohol Use: No  . Drug Use: No  . Sexual Activity: Not on file   Other Topics Concern  . Not on file   Social History Narrative   ** Merged History Encounter **       ** Data from: 05/25/12 Enc Dept: [facility]       ** Data from: 07/20/13 Enc Dept: [facility]   Patient is married Crystal Simon).   Patient has 3 children.    Patient works at [employer] in [place].   Patient has high school education.   Caffeine consumption two cups daily        Family History  Problem Relation Age of Onset  . Heart disease Father   . Hypertension Mother   . Alzheimer's disease Maternal Grandmother     Past Medical History  Diagnosis Date  . Hypertension   . Heart palpitations   . Varicose veins   . Headache(784.0)   . Bulging discs 1998    cervical  . Gilbert syndrome since late teens  . Complication of anesthesia   . PONV (postoperative nausea and vomiting)   . Constipation     Past Surgical History  Procedure Laterality Date  . Abdominal hysterectomy    . Endovenous ablation saphenous vein w/ laser  summer 2011    right and left greater saphenous veins ([place], Standard)    . Tubal ligation    . Dilation and curettage of uterus    . Vein ligation and stripping  05/07/2012    Procedure: VEIN LIGATION AND STRIPPING;  Surgeon: Rosetta Posner, MD;  Location: [facility];  Service: Vascular;  Laterality: Left;  WITH STAB PHELBECTOMY  . Vaginal hysterectomy      Current Outpatient Prescriptions  Medication Sig Dispense Refill  . Aspirin (ANACIN PO) Take  1 tablet by mouth daily as needed (pain).      Marland Kitchen estradiol (VIVELLE-DOT) 0.1 MG/24HR patch Place 1 patch (0.1 mg total) onto the skin 2 (two) times a week.  8 patch  12  . Dimethyl Fumarate (TECFIDERA) 240 MG CPDR Take 1 capsule (240 mg total) by mouth 2 (two) times daily after a meal.  60 capsule  6   No current facility-administered medications for this visit.    Allergies as of 10/05/2013 - Review Complete 10/05/2013  Allergen Reaction Noted  . Prednisone Other (See Comments) 04/20/2012    Vitals: BP 144/88  Pulse 93  Ht 5\' 9"  (1.753 m)  Wt 189 lb (85.73 kg)  BMI 27.90 kg/m2 Last Weight:  Wt Readings from Last 1 Encounters:  10/05/13 189 lb (85.73 kg)   Last Height:   Ht Readings from Last 1  Encounters:  10/05/13 5\' 9"  (1.753 m)     Physical exam: Exam: Gen: NAD, conversant Eyes: anicteric sclerae, moist conjunctivae HENT: Atraumatic, oropharynx clear Neck: Trachea midline; supple,  Lungs: CTA, no wheezing, rales, rhonic                          CV: RRR, no MRG Abdomen: Soft, non-tender;  Extremities: No peripheral edema  Skin: Normal temperature, no rash,  Psych: Appropriate affect, pleasant  Neuro: MS: AA&Ox3, appropriately interactive, normal affect   Speech: fluent w/o paraphasic error  Memory: good recent and remote recall  CN: PERRL, EOMI no nystagmus, no ptosis, sensation intact to LT V1-V3 bilat, face symmetric, no weakness, hearing grossly intact, palate elevates symmetrically, shoulder shrug 5/5 bilat,  tongue protrudes midline, no fasiculations noted.  Conjunctival injection, restricted R superior VF, unable to fully visualize optic discs bilaterally due to pupil constriction  Motor: normal bulk and tone Strength: 5/5  In all extremities  Coord: rapid alternating and point-to-point (FNF, HTS) movements intact.  Reflexes: Brisk but symmetrical, bilat downgoing toes  Sens: LT intact in all extremities  Gait: posture, stance, stride and arm-swing normal. Tandem gait intact. Able to walk on heels and toes. Romberg absent.   Assessment:  After physical and neurologic examination, review of laboratory studies, imaging, neurophysiology testing and pre-existing records, assessment will be reviewed on the problem list.  Plan:  Treatment plan and additional workup will be reviewed under Problem List.  1)Multiple sclerosis 2)Dysphagia 3)Cervical neckpain  48 year old woman sent in for follow up evaluation of visual changes , ataxia and MRI finding of a T2 hyperintensity in the midbrain. Recent LP positive for oligoclonal bands in the CSF. Symptoms and imaging/lab results suspicious for a diagnosis of Multiple Sclerosis. Extensively discussed this  diagnosis with the patient and her husband. They expressed understanding and all questions were answered. We extensively discussed different treatment options. Will start patient on Tecfidera 240mg  BID. Side effects were discussed with the patient who expressed understanding. Will check CBC and Hepatic function today. Referral made to PT and SLT. Counseled patient to take daily Vitamin D supplement. Will repeat CBC and Hepatic function 1 month after starting Tecfidera. Follow up in 3 months.   A total of 63minutes was spent in with this patient. Over half this time was spent on counseling patient on the diagnosis and different therapeutic options available. We extensively discussed the diagnosis and different treatment options. All questions were fully answered.

## 2013-10-05 NOTE — Patient Instructions (Addendum)
Overall you are doing fairly well but I do want to suggest a few things today:   Remember to drink plenty of fluid, eat healthy meals and do not skip any meals. Try to eat protein with a every meal and eat a healthy snack such as fruit or nuts in between meals. Try to keep a regular sleep-wake schedule and try to exercise daily, particularly in the form of walking, 20-30 minutes a day, if you can.   As far as your medications are concerned, I would like to suggest the following: -We are going to start you on a MS medication called Tecfidera. Please take a 120mg  tablet twice daily for 7 days and then increase to 240mg  twice daily. Please take on a full stomach. The main side effects are dyspepsia and flushing. You can try taking a daily aspirin and an antiacid to counteract this.  We will check your blood count and liver function today. We will plan to repeat this in 1 month.   We will place a referral for Physical therapy and speech therapy. Please call our office if you have not heard from anyone in 2 weeks  I would like to see you back in 3 months, sooner if we need to. Please call us with any interim questions, concerns, problems, updates or refill requests.   My clinical assistant and will answer any of your questions and relay your messages to me and also relay most of my messages to you.   Our phone number is [phone]. We also have an after hours call service for urgent matters and there is a physician on-call for urgent questions. For any emergencies you know to call 911 or go to the nearest emergency room

## 2013-10-06 ENCOUNTER — Ambulatory Visit (INDEPENDENT_AMBULATORY_CARE_PROVIDER_SITE_OTHER): Payer: Federal, State, Local not specified - PPO | Admitting: Women's Health

## 2013-10-06 ENCOUNTER — Encounter: Payer: Self-pay | Admitting: Women's Health

## 2013-10-06 VITALS — BP 120/86 | Ht 68.25 in | Wt 188.0 lb

## 2013-10-06 DIAGNOSIS — B009 Herpesviral infection, unspecified: Secondary | ICD-10-CM

## 2013-10-06 DIAGNOSIS — Z01419 Encounter for gynecological examination (general) (routine) without abnormal findings: Secondary | ICD-10-CM

## 2013-10-06 DIAGNOSIS — F411 Generalized anxiety disorder: Secondary | ICD-10-CM

## 2013-10-06 DIAGNOSIS — Z79890 Hormone replacement therapy (postmenopausal): Secondary | ICD-10-CM

## 2013-10-06 DIAGNOSIS — G35 Multiple sclerosis: Secondary | ICD-10-CM | POA: Insufficient documentation

## 2013-10-06 LAB — CBC
HEMATOCRIT: 40.5 % (ref 34.0–46.6)
Hemoglobin: 13.2 g/dL (ref 11.1–15.9)
MCH: 29.3 pg (ref 26.6–33.0)
MCHC: 32.6 g/dL (ref 31.5–35.7)
MCV: 90 fL (ref 79–97)
Platelets: 320 10*3/uL (ref 150–379)
RBC: 4.51 x10E6/uL (ref 3.77–5.28)
RDW: 13.1 % (ref 12.3–15.4)
WBC: 6.5 10*3/uL (ref 3.4–10.8)

## 2013-10-06 LAB — HEPATIC FUNCTION PANEL
ALT: 20 IU/L (ref 0–32)
AST: 15 IU/L (ref 0–40)
Albumin: 4.6 g/dL (ref 3.5–5.5)
Alkaline Phosphatase: 79 IU/L (ref 39–117)
Bilirubin, Direct: 0.2 mg/dL (ref 0.00–0.40)
Total Bilirubin: 1.2 mg/dL (ref 0.0–1.2)
Total Protein: 6.7 g/dL (ref 6.0–8.5)

## 2013-10-06 MED ORDER — ALPRAZOLAM 0.25 MG PO TABS: 0.2500 mg | ORAL_TABLET | Freq: Every evening | ORAL | Status: DC | PRN

## 2013-10-06 MED ORDER — ESTRADIOL 0.05 MG/24HR TD PTWK: 0.0500 mg | MEDICATED_PATCH | TRANSDERMAL | Status: DC

## 2013-10-06 MED ORDER — VALACYCLOVIR HCL 500 MG PO TABS: ORAL_TABLET | ORAL | Status: DC

## 2013-10-06 MED ORDER — ESTRADIOL 0.1 MG/24HR TD PTTW: 1.0000 | MEDICATED_PATCH | TRANSDERMAL | Status: DC

## 2013-10-06 NOTE — Progress Notes (Signed)
Crystal Simon 1966/03/07 [id number]    History:    New patient, transferred from Dr. Ubaldo Glassing presents for annual exam.  TAH for endometriosis on the Vivelle dot 0.1. Newly diagnosed with MS starting treatment this week. Most symptoms on the right side of her body, tingling in right hand and right leg, difficulty swallowing, right side of the esophagus. Reports normal Pap and mammogram history. Last mammogram 06/2011 normal after ultrasound. Numerous labs at primary care and urologist.  Past medical history, past surgical history, family history and social history were all reviewed and documented in the EPIC chart. 2 sons and one daughter ages 69-29 and two-step daughters all doing well. Had worked at [employer] for many years, recently resigned due to health. Mother diabetes and hypertension, father heart disease.  ROS:  A  ROS was performed and pertinent positives and negatives are included.  Exam:  Filed Vitals:   10/06/13 1420  BP: 120/86    General appearance:  Normal Thyroid:  Symmetrical, normal in size, without palpable masses or nodularity. Respiratory  Auscultation:  Clear without wheezing or rhonchi Cardiovascular  Auscultation:  Regular rate, without rubs, murmurs or gallops  Edema/varicosities:  Not grossly evident Abdominal  Soft,nontender, without masses, guarding or rebound.  Liver/spleen:  No organomegaly noted  Hernia:  None appreciated  Skin  Inspection:  Grossly normal   Breasts: Examined lying and sitting.     Right: Without masses, retractions, discharge or axillary adenopathy.     Left: Without masses, retractions, discharge or axillary adenopathy. Gentitourinary   Inguinal/mons:  Normal without inguinal adenopathy  External genitalia:  Normal  BUS/Urethra/Skene's glands:  Normal  Vagina:  Normal  Cervix:  Absent  Uterus:  Absent  Adnexa/parametria:     Rt: Without masses or tenderness.   Lt: Without masses or tenderness.  Anus and perineum: Normal  Digital  rectal exam: Normal sphincter tone without palpated masses or tenderness  Assessment/Plan:  48 y.o. MWF G3P3 for annual exam.     Newly diagnosed with multiple sclerosis has followup scheduled TVH/endometriosis/HRT Situational stress/anxiety  Plan: HRT reviewed, risks of blood clots, strokes, breast cancer reviewed. Request to stay on will continue Vivelle 0.1 patch twice weekly prescription, proper use given and reviewed. Xanax 0.25 at bedtime when necessary for situational stress. Aware of addictive properties, uses sparingly. Numerous labs at primary care and neurologist. SBE's, instructed to schedule mammogram, overdue, aware of importance of annual screen.  [place], 4:52 PM 10/06/2013

## 2013-10-06 NOTE — Patient Instructions (Signed)
Health Recommendations for Postmenopausal Women Respected and ongoing research has looked at the most common causes of death, disability, and poor quality of life in postmenopausal women. The causes include heart disease, diseases of blood vessels, diabetes, depression, cancer, and bone loss (osteoporosis). Many things can be done to help lower the chances of developing these and other common problems: CARDIOVASCULAR DISEASE Heart Disease: A heart attack is a medical emergency. Know the signs and symptoms of a heart attack. Below are things women can do to reduce their risk for heart disease.   Do not smoke. If you smoke, quit.  Aim for a healthy weight. Being overweight causes many preventable deaths. Eat a healthy and balanced diet and drink an adequate amount of liquids.  Get moving. Make a commitment to be more physically active. Aim for 30 minutes of activity on most, if not all days of the week.  Eat for heart health. Choose a diet that is low in saturated fat and cholesterol and eliminate trans fat. Include whole grains, vegetables, and fruits. Read and understand the labels on food containers before buying.  Know your numbers. Ask your caregiver to check your blood pressure, cholesterol (total, HDL, LDL, triglycerides) and blood glucose. Work with your caregiver on improving your entire clinical picture.  High blood pressure. Limit or stop your table salt intake (try salt substitute and food seasonings). Avoid salty foods and drinks. Read labels on food containers before buying. Eating well and exercising can help control high blood pressure. STROKE  Stroke is a medical emergency. Stroke may be the result of a blood clot in a blood vessel in the brain or by a brain hemorrhage (bleeding). Know the signs and symptoms of a stroke. To lower the risk of developing a stroke:  Avoid fatty foods.  Quit smoking.  Control your diabetes, blood pressure, and irregular heart rate. THROMBOPHLEBITIS  (BLOOD CLOT) OF THE LEG  Becoming overweight and leading a stationary lifestyle may also contribute to developing blood clots. Controlling your diet and exercising will help lower the risk of developing blood clots. CANCER SCREENING  Breast Cancer: Take steps to reduce your risk of breast cancer.  You should practice "breast self-awareness." This means understanding the normal appearance and feel of your breasts and should include breast self-examination. Any changes detected, no matter how small, should be reported to your caregiver.  After age 40, you should have a clinical breast exam (CBE) every year.  Starting at age 40, you should consider having a mammogram (breast X-ray) every year.  If you have a family history of breast cancer, talk to your caregiver about genetic screening.  If you are at high risk for breast cancer, talk to your caregiver about having an MRI and a mammogram every year.  Intestinal or Stomach Cancer: Tests to consider are a rectal exam, fecal occult blood, sigmoidoscopy, and colonoscopy. Women who are high risk may need to be screened at an earlier age and more often.  Cervical Cancer:  Beginning at age 30, you should have a Pap test every 3 years as long as the past 3 Pap tests have been normal.  If you have had past treatment for cervical cancer or a condition that could lead to cancer, you need Pap tests and screening for cancer for at least 20 years after your treatment.  If you had a hysterectomy for a problem that was not cancer or a condition that could lead to cancer, then you no longer need Pap tests.    If you are between ages 65 and 70, and you have had normal Pap tests going back 10 years, you no longer need Pap tests.  If Pap tests have been discontinued, risk factors (such as a new sexual partner) need to be reassessed to determine if screening should be resumed.  Some medical problems can increase the chance of getting cervical cancer. In these  cases, your caregiver may recommend more frequent screening and Pap tests.  Uterine Cancer: If you have vaginal bleeding after reaching menopause, you should notify your caregiver.  Ovarian cancer: Other than yearly pelvic exams, there are no reliable tests available to screen for ovarian cancer at this time except for yearly pelvic exams.  Lung Cancer: Yearly chest X-rays can detect lung cancer and should be done on high risk women, such as cigarette smokers and women with chronic lung disease (emphysema).  Skin Cancer: A complete body skin exam should be done at your yearly examination. Avoid overexposure to the sun and ultraviolet light lamps. Use a strong sun block cream when in the sun. All of these things are important in lowering the risk of skin cancer. MENOPAUSE Menopause Symptoms: Hormone therapy products are effective for treating symptoms associated with menopause:  Moderate to severe hot flashes.  Night sweats.  Mood swings.  Headaches.  Tiredness.  Loss of sex drive.  Insomnia.  Other symptoms. Hormone replacement carries certain risks, especially in older women. Women who use or are thinking about using estrogen or estrogen with progestin treatments should discuss that with their caregiver. Your caregiver will help you understand the benefits and risks. The ideal dose of hormone replacement therapy is not known. The Food and Drug Administration (FDA) has concluded that hormone therapy should be used only at the lowest doses and for the shortest amount of time to reach treatment goals.  OSTEOPOROSIS Protecting Against Bone Loss and Preventing Fracture: If you use hormone therapy for prevention of bone loss (osteoporosis), the risks for bone loss must outweigh the risk of the therapy. Ask your caregiver about other medications known to be safe and effective for preventing bone loss and fractures. To guard against bone loss or fractures, the following is recommended:  If  you are less than age 50, take 1000 mg of calcium and at least 600 mg of Vitamin D per day.  If you are greater than age 50 but less than age 70, take 1200 mg of calcium and at least 600 mg of Vitamin D per day.  If you are greater than age 70, take 1200 mg of calcium and at least 800 mg of Vitamin D per day. Smoking and excessive alcohol intake increases the risk of osteoporosis. Eat foods rich in calcium and vitamin D and do weight bearing exercises several times a week as your caregiver suggests. DIABETES Diabetes Melitus: If you have Type I or Type 2 diabetes, you should keep your blood sugar under control with diet, exercise and recommended medication. Avoid too many sweets, starchy and fatty foods. Being overweight can make control more difficult. COGNITION AND MEMORY Cognition and Memory: Menopausal hormone therapy is not recommended for the prevention of cognitive disorders such as Alzheimer's disease or memory loss.  DEPRESSION  Depression may occur at any age, but is common in elderly women. The reasons may be because of physical, medical, social (loneliness), or financial problems and needs. If you are experiencing depression because of medical problems and control of symptoms, talk to your caregiver about this. Physical activity and   exercise may help with mood and sleep. Community and volunteer involvement may help your sense of value and worth. If you have depression and you feel that the problem is getting worse or becoming severe, talk to your caregiver about treatment options that are best for you. ACCIDENTS  Accidents are common and can be serious in the elderly woman. Prepare your house to prevent accidents. Eliminate throw rugs, place hand bars in the bath, shower and toilet areas. Avoid wearing high heeled shoes or walking on wet, snowy, and icy areas. Limit or stop driving if you have vision or hearing problems, or you feel you are unsteady with you movements and  reflexes. HEPATITIS C Hepatitis C is a type of viral infection affecting the liver. It is spread mainly through contact with blood from an infected person. It can be treated, but if left untreated, it can lead to severe liver damage over years. Many people who are infected do not know that the virus is in their blood. If you are a "baby-boomer", it is recommended that you have one screening test for Hepatitis C. IMMUNIZATIONS  Several immunizations are important to consider having during your senior years, including:   Tetanus, diptheria, and pertussis booster shot.  Influenza every year before the flu season begins.  Pneumonia vaccine.  Shingles vaccine.  Others as indicated based on your specific needs. Talk to your caregiver about these. Document Released: 10/17/2005 Document Revised: 08/11/2012 Document Reviewed: 06/12/2008 Exit[employer] Patient Information 2014 [employer].  

## 2013-10-07 ENCOUNTER — Ambulatory Visit: Payer: Federal, State, Local not specified - PPO | Admitting: Neurology

## 2013-10-07 ENCOUNTER — Telehealth: Payer: Self-pay | Admitting: *Deleted

## 2013-10-07 NOTE — Telephone Encounter (Signed)
Pharmacist called asking if patient should be on vivelle dot 0.1 mg or climara 0.5 mg. I informed it should be vivelle dot patch 0.1

## 2013-10-13 ENCOUNTER — Telehealth: Payer: Self-pay | Admitting: *Deleted

## 2013-10-13 NOTE — Telephone Encounter (Signed)
Left patient a message about normal lab results and to start Tecfidera when it arrives. If the patient has any questions she is to call the office back.

## 2013-10-13 NOTE — Telephone Encounter (Signed)
Message copied by Vivi Barrack on Thu Oct 13, 2013  4:48 PM ------      Message from: Drema Dallas      Created: Thu Oct 06, 2013  8:11 AM       Please let her know her blood work is normal and she should plan to start the Tecfidera when it comes. Thanks. ------

## 2013-10-20 ENCOUNTER — Telehealth: Payer: Self-pay | Admitting: Neurology

## 2013-10-20 NOTE — Telephone Encounter (Signed)
Patient states was recently diagnosed with MS and has difficulty swallowing on one side of her throat, was told it was due to the lesion on her brain, but patient is wondering if she needs to be seen by an ENT specialist because she has had problems with tonsils in the past. Patient wants to know whether she needs to see them to determine how much of the throat trouble is due to MS or tonsils. Please call and advise patient.

## 2013-10-20 NOTE — Telephone Encounter (Signed)
Spoke with patient and she said that she is scheduled for speech on 10/21/13, but will they address the throat problem,wants to know how much of the throat problem is related to MS?

## 2013-10-20 NOTE — Telephone Encounter (Signed)
Called patient back, all questions answered. She will proceed with speech therapy.

## 2013-10-21 ENCOUNTER — Ambulatory Visit: Payer: Federal, State, Local not specified - PPO

## 2013-10-21 ENCOUNTER — Ambulatory Visit: Payer: Federal, State, Local not specified - PPO | Attending: Neurology

## 2013-10-21 DIAGNOSIS — Z5189 Encounter for other specified aftercare: Secondary | ICD-10-CM | POA: Insufficient documentation

## 2013-10-21 DIAGNOSIS — R279 Unspecified lack of coordination: Secondary | ICD-10-CM | POA: Insufficient documentation

## 2013-10-21 DIAGNOSIS — M542 Cervicalgia: Secondary | ICD-10-CM | POA: Insufficient documentation

## 2013-10-21 DIAGNOSIS — G35 Multiple sclerosis: Secondary | ICD-10-CM | POA: Insufficient documentation

## 2013-10-21 DIAGNOSIS — R1310 Dysphagia, unspecified: Secondary | ICD-10-CM | POA: Insufficient documentation

## 2013-10-25 ENCOUNTER — Other Ambulatory Visit (HOSPITAL_COMMUNITY): Payer: Self-pay | Admitting: Neurology

## 2013-10-25 DIAGNOSIS — R1310 Dysphagia, unspecified: Secondary | ICD-10-CM

## 2013-10-26 ENCOUNTER — Ambulatory Visit: Payer: Federal, State, Local not specified - PPO

## 2013-10-28 ENCOUNTER — Ambulatory Visit: Payer: Federal, State, Local not specified - PPO

## 2013-11-01 ENCOUNTER — Ambulatory Visit (HOSPITAL_COMMUNITY): Payer: Federal, State, Local not specified - PPO

## 2013-11-01 ENCOUNTER — Inpatient Hospital Stay (HOSPITAL_COMMUNITY): Admission: RE | Admit: 2013-11-01 | Payer: Federal, State, Local not specified - PPO | Source: Ambulatory Visit

## 2013-11-03 ENCOUNTER — Ambulatory Visit: Payer: Federal, State, Local not specified - PPO

## 2013-11-04 ENCOUNTER — Ambulatory Visit (HOSPITAL_COMMUNITY): Payer: Federal, State, Local not specified - PPO

## 2013-11-04 ENCOUNTER — Telehealth: Payer: Self-pay | Admitting: Neurology

## 2013-11-04 ENCOUNTER — Ambulatory Visit: Payer: Federal, State, Local not specified - PPO

## 2013-11-04 ENCOUNTER — Inpatient Hospital Stay (HOSPITAL_COMMUNITY): Admission: RE | Admit: 2013-11-04 | Payer: Federal, State, Local not specified - PPO | Source: Ambulatory Visit

## 2013-11-04 NOTE — Telephone Encounter (Signed)
Called patient back. Will decrease to lower dose (120mg ) twice a day for 2 weeks to see if symptoms resolve. If no improvement will have patient return to office for follow up and discussion of different options.

## 2013-11-04 NOTE — Telephone Encounter (Signed)
Pt called about her prescription Dimethyl Fumarate (TECFIDERA) 240 MG CPDR has swelling, can't breathe, feels like she is having a heartattack.  Pt would like for Dr. Hazle Quant nurse to call her back concerning this matter.

## 2013-11-04 NOTE — Telephone Encounter (Signed)
Patient is calling about the medication dimethyl fumarate, stating that she is having swelling, hard time breathing and feels like she is having a heart attack. Patient would like for Dr. Janann Colonel to give her a call back please. Thanks

## 2013-11-09 ENCOUNTER — Ambulatory Visit: Payer: Federal, State, Local not specified - PPO | Attending: Neurology | Admitting: Physical Therapy

## 2013-11-09 DIAGNOSIS — R279 Unspecified lack of coordination: Secondary | ICD-10-CM | POA: Insufficient documentation

## 2013-11-09 DIAGNOSIS — M542 Cervicalgia: Secondary | ICD-10-CM | POA: Insufficient documentation

## 2013-11-09 DIAGNOSIS — Z5189 Encounter for other specified aftercare: Secondary | ICD-10-CM | POA: Insufficient documentation

## 2013-11-09 DIAGNOSIS — R1310 Dysphagia, unspecified: Secondary | ICD-10-CM | POA: Insufficient documentation

## 2013-11-09 DIAGNOSIS — G35 Multiple sclerosis: Secondary | ICD-10-CM | POA: Insufficient documentation

## 2013-11-10 ENCOUNTER — Ambulatory Visit (HOSPITAL_COMMUNITY)
Admission: RE | Admit: 2013-11-10 | Discharge: 2013-11-10 | Disposition: A | Discharge status: Home / Self Care | Payer: Federal, State, Local not specified - PPO | Source: Ambulatory Visit | Attending: Neurology | Admitting: Neurology

## 2013-11-10 DIAGNOSIS — R1310 Dysphagia, unspecified: Secondary | ICD-10-CM | POA: Insufficient documentation

## 2013-11-10 NOTE — Procedures (Signed)
Objective Swallowing Evaluation: Modified Barium Swallowing Study  Patient Details  Name: Crystal Simon MRN: [id number] Date of Birth: 1966/03/03  Today's Date: 11/10/2013 Time: 1100-1120 SLP Time Calculation (min): 20 min  Past Medical History:  Past Medical History  Diagnosis Date  . Hypertension   . Heart palpitations   . Varicose veins   . Headache(784.0)   . Bulging discs 1998    cervical  . Gilbert syndrome since late teens  . Complication of anesthesia   . PONV (postoperative nausea and vomiting)   . Constipation   . MS (multiple sclerosis)    Past Surgical History:  Past Surgical History  Procedure Laterality Date  . Abdominal hysterectomy    . Endovenous ablation saphenous vein w/ laser  summer 2011    right and left greater saphenous veins (, [place])    . Tubal ligation    . Dilation and curettage of uterus    . Vein ligation and stripping  05/07/2012    Procedure: VEIN LIGATION AND STRIPPING;  Surgeon: Rosetta Posner, MD;  Location: [facility];  Service: Vascular;  Laterality: Left;  WITH STAB PHELBECTOMY  . Vaginal hysterectomy     HPI:  Pt is a 48 year old female arriving for an outpatient MBS. Pt has MS and reports trouble swallowing and a very uncomfortable feeling particularly on the right side. She says the feeling is always there but severity can change. She reprots history of indigestion, recently worsened due to medications for MS. She also reprots that she has slurred speech due to right sided weakness with MS flares. Pt is observed to constantly clear her throat; she attributes this to sinus drainage.      Assessment / Plan / Recommendation Clinical Impression  Dysphagia Diagnosis: Within Functional Limits Clinical impression: Pt presents with normal oral and oropharyngeal function. No weakness, residual or aspiration during study. Despite this, pt was constantly clearing her throat and sometimes coughing. She continued to sense globus on right side of throat.  Used visual feedback to demosntrate lack of residuals. Suspect GER or sinus drainage eliciting throat clear which irritates airway and leads to sensation and ongoing throat clearing. Pay may want to consider referral to GI or pulmonologist for upper airway exam. No SLP f/u needed. Pt may continue regular diet.     Treatment Recommendation  No treatment recommended at this time    Diet Recommendation Regular;Thin liquid   Liquid Administration via: Cup;Straw Medication Administration: Whole meds with liquid Supervision: Patient able to self feed Postural Changes and/or Swallow Maneuvers: Seated upright 90 degrees;Upright 30-60 min after meal    Other  Recommendations Recommended Consults: Consider GI evaluation   Follow Up Recommendations       Frequency and Duration        Pertinent Vitals/Pain NA    SLP Swallow Goals     General HPI: Pt is a 48 year old female arriving for an outpatient MBS. Pt has MS and reports trouble swallowing and a very uncomfortable feeling particularly on the right side. She says the feeling is always there but severity can change. She reprots history of indigestion, recently worsened due to medications for MS. She also reprots that she has slurred speech due to right sided weakness with MS flares. Pt is observed to constantly clear her throat; she attributes this to sinus drainage.  Type of Study: Modified Barium Swallowing Study Reason for Referral: Objectively evaluate swallowing function Diet Prior to this Study: Regular;Thin liquids Temperature Spikes Noted: No  Respiratory Status: Room air History of Recent Intubation: No Behavior/Cognition: Alert;Cooperative;Pleasant mood Oral Cavity - Dentition: Adequate natural dentition Oral Motor / Sensory Function: Within functional limits Self-Feeding Abilities: Able to feed self Patient Positioning: Upright in chair Baseline Vocal Quality: Clear Volitional Cough: Strong Volitional Swallow: Able to  elicit Anatomy: Within functional limits Pharyngeal Secretions: Not observed secondary MBS    Reason for Referral Objectively evaluate swallowing function   Oral Phase Oral Preparation/Oral Phase Oral Phase: WFL   Pharyngeal Phase Pharyngeal Phase Pharyngeal Phase: Within functional limits  Cervical Esophageal Phase    GO    Cervical Esophageal Phase Cervical Esophageal Phase: [facility]    Functional Assessment Tool Used:  (clinical judgement) Functional Limitations: Swallowing Swallow Current Status (F7902): 0 percent impaired, limited or restricted Swallow Goal Status (I0973): 0 percent impaired, limited or restricted Swallow Discharge Status [phone]): 0 percent impaired, limited or restricted   [facility], [place] CCC-SLP [phone]  Lynann Beaver 11/10/2013, 1:49 PM

## 2013-11-11 ENCOUNTER — Ambulatory Visit: Payer: Federal, State, Local not specified - PPO | Admitting: Physical Therapy

## 2013-11-16 ENCOUNTER — Ambulatory Visit: Payer: Federal, State, Local not specified - PPO

## 2013-11-16 ENCOUNTER — Other Ambulatory Visit: Payer: Self-pay | Admitting: *Deleted

## 2013-11-16 ENCOUNTER — Other Ambulatory Visit: Payer: Self-pay | Admitting: Neurology

## 2013-11-16 ENCOUNTER — Other Ambulatory Visit (INDEPENDENT_AMBULATORY_CARE_PROVIDER_SITE_OTHER): Payer: Self-pay

## 2013-11-16 DIAGNOSIS — G35 Multiple sclerosis: Secondary | ICD-10-CM

## 2013-11-16 DIAGNOSIS — Z0289 Encounter for other administrative examinations: Secondary | ICD-10-CM

## 2013-11-17 ENCOUNTER — Telehealth: Payer: Self-pay | Admitting: Neurology

## 2013-11-17 LAB — CBC
HEMATOCRIT: 38.3 % (ref 34.0–46.6)
HEMOGLOBIN: 12.9 g/dL (ref 11.1–15.9)
MCH: 29.6 pg (ref 26.6–33.0)
MCHC: 33.7 g/dL (ref 31.5–35.7)
MCV: 88 fL (ref 79–97)
Platelets: 293 10*3/uL (ref 150–379)
RBC: 4.36 x10E6/uL (ref 3.77–5.28)
RDW: 13.3 % (ref 12.3–15.4)
WBC: 5.9 10*3/uL (ref 3.4–10.8)

## 2013-11-17 LAB — HEPATIC FUNCTION PANEL
ALT: 20 IU/L (ref 0–32)
AST: 18 IU/L (ref 0–40)
Albumin: 4.5 g/dL (ref 3.5–5.5)
Alkaline Phosphatase: 77 IU/L (ref 39–117)
Bilirubin, Direct: 0.26 mg/dL (ref 0.00–0.40)
TOTAL PROTEIN: 6.8 g/dL (ref 6.0–8.5)
Total Bilirubin: 1.2 mg/dL (ref 0.0–1.2)

## 2013-11-17 NOTE — Progress Notes (Signed)
Quick Note:  Left message that labs were normal, instructed any questions or concerns to give the office a call back ______

## 2013-11-17 NOTE — Telephone Encounter (Signed)
Called patients, no answer left voice message of normal labs, any questions or concerns to please call the office back.

## 2013-11-18 ENCOUNTER — Ambulatory Visit: Payer: Federal, State, Local not specified - PPO

## 2013-11-18 NOTE — Telephone Encounter (Signed)
Crystal Simon called that she is on Tecfidera for 1 month,  was cut back to 1/2 dosage, initially had GI symptoms, now is back on full dose today, complains of mild rash, burning sensation, chest is red.  She could not tolerate ASA due to GI side effect, I have suggested her taking pills after meal,   try ASA 81mg  after meal,  she may also try benadryl

## 2013-11-21 ENCOUNTER — Ambulatory Visit: Payer: Federal, State, Local not specified - PPO

## 2013-11-23 ENCOUNTER — Ambulatory Visit: Payer: Federal, State, Local not specified - PPO

## 2013-11-24 ENCOUNTER — Telehealth: Payer: Self-pay | Admitting: Neurology

## 2013-11-24 NOTE — Telephone Encounter (Signed)
Pt call in this morning. She stated that she has a bad cold/flu with body aches, head congestion and cough.  She stated that it started with a sore throat about 4 days ago but for the past 2 days she has felt really bad.  She stated that she had gone to the pharmacy and they recommended she take sudafed, but she wanted to make sure that this will be ok to take with her MS medicines. She is asking what Dr. Janann Colonel would recommend she take or if he feels she may want to go to her family doctor.  Please call to discuss.  Thank you

## 2013-11-24 NOTE — Telephone Encounter (Signed)
Called pt to inform her that per Dr. Janann Colonel, pt could take Sudafed along with her MS medication tecfidera. I advised the pt that if she has any other problems, questions or concerns to call the office. Pt verbalized understanding.

## 2013-11-24 NOTE — Telephone Encounter (Signed)
Please let her know that taking tecfidera and sudafed together is ok.

## 2013-11-24 NOTE — Telephone Encounter (Signed)
Pt calling wanting to know if it was ok to take cold/flu medication with her MS medication. Pt wanted to know if the doctor suggest something. Please advise

## 2013-11-28 ENCOUNTER — Ambulatory Visit: Payer: Federal, State, Local not specified - PPO

## 2013-11-30 ENCOUNTER — Ambulatory Visit: Payer: Federal, State, Local not specified - PPO | Admitting: Physical Therapy

## 2013-12-05 ENCOUNTER — Telehealth: Payer: Self-pay | Admitting: Neurology

## 2013-12-05 ENCOUNTER — Ambulatory Visit: Payer: Federal, State, Local not specified - PPO

## 2013-12-05 NOTE — Telephone Encounter (Signed)
Please let her know she should check with her primary care physician for treatment of lower extremity edema. Thanks.

## 2013-12-05 NOTE — Telephone Encounter (Signed)
Informed patient per Dr Sumner's note, she verbalized understanding and said ok 

## 2013-12-07 ENCOUNTER — Ambulatory Visit: Payer: Federal, State, Local not specified - PPO | Attending: Neurology

## 2013-12-07 DIAGNOSIS — M542 Cervicalgia: Secondary | ICD-10-CM | POA: Insufficient documentation

## 2013-12-07 DIAGNOSIS — R1310 Dysphagia, unspecified: Secondary | ICD-10-CM | POA: Insufficient documentation

## 2013-12-07 DIAGNOSIS — G35 Multiple sclerosis: Secondary | ICD-10-CM | POA: Insufficient documentation

## 2013-12-07 DIAGNOSIS — Z5189 Encounter for other specified aftercare: Secondary | ICD-10-CM | POA: Insufficient documentation

## 2013-12-07 DIAGNOSIS — R279 Unspecified lack of coordination: Secondary | ICD-10-CM | POA: Insufficient documentation

## 2013-12-16 ENCOUNTER — Telehealth: Payer: Self-pay | Admitting: *Deleted

## 2013-12-16 ENCOUNTER — Telehealth: Payer: Self-pay | Admitting: Neurology

## 2013-12-16 ENCOUNTER — Other Ambulatory Visit: Payer: Self-pay | Admitting: Neurology

## 2013-12-16 MED ORDER — ALPRAZOLAM 0.5 MG PO TABS: 0.5000 mg | ORAL_TABLET | Freq: Every evening | ORAL | Status: DC | PRN

## 2013-12-16 NOTE — Telephone Encounter (Signed)
Pt calling requesting xanax 0.5 mg refill, pt was prescribed xanax 0.25 mg at [place] 10/06/13 states this strength didn't work. Please advise

## 2013-12-16 NOTE — Telephone Encounter (Signed)
Left on voicemail this has been done, rx called in.

## 2013-12-16 NOTE — Telephone Encounter (Signed)
Patient came to office today and had received a notice from BC/BS that warned her if she had any blockages or bleeding to discontinue drug.  I explained that this should go through her Gynecologist but she wanted Dr. Hazle Quant advise also since he did all the MRI testing.  She will call the GYN also.

## 2013-12-16 NOTE — Telephone Encounter (Signed)
Okay, can call in Xanax 0.5 take as needed or at at bedtime, #30 with 1 refill. Review to use sparingly, addictive.

## 2013-12-16 NOTE — Telephone Encounter (Signed)
The patient called again because she got cut off on the phone speaking with the doctor.  After seeing her remarks I told her the only tests showing in her chart ordered by the doctor were the two I mentioned.  It is totally unreasonable for her to walk in the office asking all these questions because of a BC/BS warning she received.  In addition the patient stated that she wanted to be sure all her tests were listed in record because she is looking for disability.  Her continued phone calls have been very time consuming and unwarranted.

## 2013-12-16 NOTE — Telephone Encounter (Signed)
Returned patients call. All questions answered.

## 2013-12-16 NOTE — Telephone Encounter (Signed)
Patient called requesting an appt to have a sit down with the physician because she is not happy with the call backs she has been getting from the nurses. She feels that the nurse she spoke with knew very much about her condition and was not aware of tests that she has had here. She is requesting that Dr. Janann Colonel contact her personally. Does not want to speak back with a nurse only physician.

## 2013-12-19 NOTE — Telephone Encounter (Signed)
Patient spoke to Dr. Janann Colonel.

## 2013-12-22 ENCOUNTER — Other Ambulatory Visit (INDEPENDENT_AMBULATORY_CARE_PROVIDER_SITE_OTHER): Payer: Self-pay

## 2013-12-22 ENCOUNTER — Other Ambulatory Visit: Payer: Self-pay | Admitting: *Deleted

## 2013-12-22 DIAGNOSIS — G35 Multiple sclerosis: Secondary | ICD-10-CM

## 2013-12-22 DIAGNOSIS — Z0289 Encounter for other administrative examinations: Secondary | ICD-10-CM

## 2013-12-23 LAB — HEPATIC FUNCTION PANEL
ALT: 18 IU/L (ref 0–32)
AST: 17 IU/L (ref 0–40)
Albumin: 4.8 g/dL (ref 3.5–5.5)
Alkaline Phosphatase: 79 IU/L (ref 39–117)
BILIRUBIN TOTAL: 0.7 mg/dL (ref 0.0–1.2)
Bilirubin, Direct: 0.14 mg/dL (ref 0.00–0.40)
TOTAL PROTEIN: 7.2 g/dL (ref 6.0–8.5)

## 2013-12-23 LAB — CBC
HCT: 39.3 % (ref 34.0–46.6)
HEMOGLOBIN: 13.4 g/dL (ref 11.1–15.9)
MCH: 29.5 pg (ref 26.6–33.0)
MCHC: 34.1 g/dL (ref 31.5–35.7)
MCV: 87 fL (ref 79–97)
Platelets: 288 10*3/uL (ref 150–379)
RBC: 4.54 x10E6/uL (ref 3.77–5.28)
RDW: 13 % (ref 12.3–15.4)
WBC: 4.5 10*3/uL (ref 3.4–10.8)

## 2014-01-05 ENCOUNTER — Encounter: Payer: Self-pay | Admitting: Neurology

## 2014-01-05 ENCOUNTER — Ambulatory Visit (INDEPENDENT_AMBULATORY_CARE_PROVIDER_SITE_OTHER): Payer: Federal, State, Local not specified - PPO | Admitting: Neurology

## 2014-01-05 VITALS — BP 136/86 | HR 95 | Ht 69.0 in | Wt 187.0 lb

## 2014-01-05 DIAGNOSIS — R269 Unspecified abnormalities of gait and mobility: Secondary | ICD-10-CM

## 2014-01-05 DIAGNOSIS — G35 Multiple sclerosis: Secondary | ICD-10-CM

## 2014-01-05 DIAGNOSIS — R2681 Unsteadiness on feet: Secondary | ICD-10-CM

## 2014-01-05 DIAGNOSIS — K5900 Constipation, unspecified: Secondary | ICD-10-CM

## 2014-01-05 MED ORDER — ALPRAZOLAM 0.5 MG PO TABS: 0.5000 mg | ORAL_TABLET | Freq: Every evening | ORAL | Status: DC | PRN

## 2014-01-05 NOTE — Patient Instructions (Addendum)
Overall you are doing fairly well but I do want to suggest a few things today:   Remember to drink plenty of fluid, eat healthy meals and do not skip any meals. Try to eat protein with a every meal and eat a healthy snack such as fruit or nuts in between meals. Try to keep a regular sleep-wake schedule and try to exercise daily, particularly in the form of walking, 20-30 minutes a day, if you can.   As far as your medications are concerned, I would like to suggest not making any changes at this time. You were given a refill of your Xanax.   For constipation: try the "prune juice cocktail". Mix 1/2 cup applesauce, 2 tablespoons of miller's bran, 4-6 oz. prune juice. Store in [employer]. Take a tablespoonful per day at first, gradually increasing until you find the amount that works best. Most people find this mixture tasty.  Please call us with any interim questions, concerns, problems, updates or refill requests.   My clinical assistant and will answer any of your questions and relay your messages to me and also relay most of my messages to you.   Our phone number is [phone]. We also have an after hours call service for urgent matters and there is a physician on-call for urgent questions. For any emergencies you know to call 911 or go to the nearest emergency room

## 2014-01-05 NOTE — Progress Notes (Signed)
[facility]    Provider:  Dr Janann Colonel Referring Provider: Charlotte Sanes, MD Primary Care Physician:  Charlotte Sanes, MD  CC:  Possible MS  HPI:  Crystal Simon is a 48 y.o. female here as a follow up from Dr. Spero Curb for vision changes concerning for MS. Most recent visit was 09/19/2012 at which time an LP was ordered. LP results + for 3 oligoclonal bands, otherwise unremarkable. Patient returns today for follow up after recently starting on Tecfidera. Initially had some concerns over side effects with the Tecfidera but feels she is tolerating it better. Notes that the medication has called some constipation, has tried treating with colace but has not gotten any benefit with this. Swallowing is stable, it fluctuates on a day to day basis. No other acute concerns at this time.    Initial visit 07/2013 Around one week ago had acute onset of double vision, blurry vision and dizziness. Symptoms would briefly come and go. Initially a head CT done which was unremarkable, at followup had a brain MRI done showing question of a brainstem T2 hyperintensity. Patient was started on oral prednisone taper and her home oral patch was stopped. Since taking the prednisone she notes she feels worse, the vision has improved but she does continue to have intermittent blurry vision. She is gaining weight and feels worse overall.   Review of Systems: Out of a complete 14 system review, the patient complains of only the following symptoms, and all other reviewed systems are negative. Positive neck pain neck stiffness nausea aching muscles dizziness headache numbness  History   Social History  . Marital Status: Married    Spouse Name: Cletus Gash     Number of Children: 3  . Years of Education: 12   Occupational History  . Not on file.   Social History Main Topics  . Smoking status: Former Smoker    Quit date: 09/08/1994  . Smokeless tobacco: Never Used     Comment: Quit 09/2013  . Alcohol  Use: No  . Drug Use: No  . Sexual Activity: Yes   Other Topics Concern  . Not on file   Social History Narrative   ** Merged History Encounter **       ** Data from: 05/25/12 Enc Dept: [facility]       ** Data from: 07/20/13 Enc Dept: [facility]   Patient is married Cletus Gash).   Patient has 3 children.    Patient works at [employer] in [place].   Patient has high school education.   Caffeine consumption two cups daily        Family History  Problem Relation Age of Onset  . Heart disease Father   . Hypertension Mother   . Cancer Mother     SKIN  . Diabetes Mother   . Alzheimer's disease Maternal Grandmother   . Diabetes Maternal Grandmother   . Cancer Maternal Uncle     MELANOMA    Past Medical History  Diagnosis Date  . Hypertension   . Heart palpitations   . Varicose veins   . Headache(784.0)   . Bulging discs 1998    cervical  . Gilbert syndrome since late teens  . Complication of anesthesia   . PONV (postoperative nausea and vomiting)   . Constipation   . MS (multiple sclerosis)     Past Surgical History  Procedure Laterality Date  . Abdominal hysterectomy    . Endovenous ablation saphenous vein w/ laser  summer 2011    right  and left greater saphenous veins ([place], [place])    . Tubal ligation    . Dilation and curettage of uterus    . Vein ligation and stripping  05/07/2012    Procedure: VEIN LIGATION AND STRIPPING;  Surgeon: Rosetta Posner, MD;  Location: [facility];  Service: Vascular;  Laterality: Left;  WITH STAB PHELBECTOMY  . Vaginal hysterectomy      Current Outpatient Prescriptions  Medication Sig Dispense Refill  . ALPRAZolam (XANAX) 0.5 MG tablet Take 1 tablet (0.5 mg total) by mouth at bedtime as needed for anxiety.  30 tablet  1  . Aspirin (ANACIN PO) Take 1 tablet by mouth daily as needed (pain).      . Cholecalciferol (VITAMIN D-3 PO) Take by mouth.      [name] Sodium (COLACE PO) Take by mouth.      . Esomeprazole Magnesium (NEXIUM  PO) Take by mouth.      . Ibuprofen (ADVIL PO) Take by mouth.      . Ibuprofen-Diphenhydramine Cit (ADVIL PM PO) Take by mouth.      . Loratadine (CLARITIN PO) Take by mouth.       No current facility-administered medications for this visit.    Allergies as of 01/05/2014 - Review Complete 01/05/2014  Allergen Reaction Noted  . Prednisone Other (See Comments) 04/20/2012    Vitals: BP 136/86  Pulse 95  Ht 5\' 9"  (1.753 m)  Wt 187 lb (84.823 kg)  BMI 27.60 kg/m2 Last Weight:  Wt Readings from Last 1 Encounters:  01/05/14 187 lb (84.823 kg)   Last Height:   Ht Readings from Last 1 Encounters:  01/05/14 5\' 9"  (1.753 m)     Physical exam: Exam: Gen: NAD, conversant Eyes: anicteric sclerae, moist conjunctivae HENT: Atraumatic, oropharynx clear Neck: Trachea midline; supple,  Lungs: CTA, no wheezing, rales, rhonic                          CV: RRR, no MRG Abdomen: Soft, non-tender;  Extremities: No peripheral edema  Skin: Normal temperature, no rash,  Psych: Appropriate affect, pleasant  Neuro: MS: AA&Ox3, appropriately interactive, normal affect   Speech: fluent w/o paraphasic error  Memory: good recent and remote recall  CN: PERRL, EOMI no nystagmus, no ptosis, sensation intact to LT V1-V3 bilat, face symmetric, no weakness, hearing grossly intact, palate elevates symmetrically, shoulder shrug 5/5 bilat,  tongue protrudes midline, no fasiculations noted.  Conjunctival injection, restricted R superior VF, unable to fully visualize optic discs bilaterally due to pupil constriction  Motor: normal bulk and tone Strength: 5/5  In all extremities  Coord: rapid alternating and point-to-point (FNF, HTS) movements intact.  Reflexes: Brisk but symmetrical, bilat downgoing toes  Sens: LT intact in all extremities  Gait: posture, stance, stride and arm-swing normal. Tandem gait intact with multiple misteps. Able to walk on heels and toes. Romberg absent.   Assessment:   After physical and neurologic examination, review of laboratory studies, imaging, neurophysiology testing and pre-existing records, assessment will be reviewed on the problem list.  Plan:  Treatment plan and additional workup will be reviewed under Problem List.  1)Multiple sclerosis 2)Dysphagia 3)onstopation  48 year old woman with MS presenting for follow up visit. Currently taking Tecfidera and overall tolerating it well. Main concern at this visit is continued gait instability/vertigo and trouble with constipation. Blood work has been stable. With concern of worsening gait will plan for repeat MRI brain w/ and w/out contrast. Will  try "constipation cocktail" for symptomatic relief. If no improvement can consider trying MOM or Linzess. Will refer to PT for vestibular rehab. Follow up in 4 months.

## 2014-01-06 ENCOUNTER — Ambulatory Visit: Payer: Federal, State, Local not specified - PPO | Admitting: Neurology

## 2014-01-18 ENCOUNTER — Ambulatory Visit (INDEPENDENT_AMBULATORY_CARE_PROVIDER_SITE_OTHER): Payer: Federal, State, Local not specified - PPO

## 2014-01-18 DIAGNOSIS — R269 Unspecified abnormalities of gait and mobility: Secondary | ICD-10-CM

## 2014-01-18 DIAGNOSIS — R2681 Unsteadiness on feet: Secondary | ICD-10-CM

## 2014-01-18 MED ORDER — GADOPENTETATE DIMEGLUMINE 469.01 MG/ML IV SOLN: 18.0000 mL | Freq: Once | INTRAVENOUS | Status: AC | PRN

## 2014-01-23 ENCOUNTER — Ambulatory Visit (INDEPENDENT_AMBULATORY_CARE_PROVIDER_SITE_OTHER): Payer: Federal, State, Local not specified - PPO | Admitting: Neurology

## 2014-01-23 ENCOUNTER — Encounter: Payer: Self-pay | Admitting: Neurology

## 2014-01-23 VITALS — BP 137/86 | HR 75 | Ht 67.0 in | Wt 189.0 lb

## 2014-01-23 DIAGNOSIS — G35 Multiple sclerosis: Secondary | ICD-10-CM

## 2014-01-23 NOTE — Progress Notes (Signed)
Crystal Simon    Provider:  Dr Janann Simon Referring Provider: Charlotte Sanes, MD Primary Care Physician:  Crystal Sanes, MD  CC:  Possible MS  HPI:  Crystal Simon is a 48 y.o. female here as a follow up from Crystal. Spero Simon for vision changes found to be secondary to Crystal Simon. Most recent visit was 12/2013 at which time a repeat MRI brain was ordered due to concern of gait instability. She also was referred to PT for vestibular rehab and give treatment for constipation. She returns today to discuss the MRI results. Continues to have gait instability and dizziness, no acute concerns.   LP Results: + for 3 oligoclonal bands  Initial visit 07/2013 Around one week ago had acute onset of double vision, blurry vision and dizziness. Symptoms would briefly come and go. Initially a head CT done which was unremarkable, at followup had a brain MRI done showing question of a brainstem T2 hyperintensity. Patient was started on oral prednisone taper and her home oral patch was stopped. Since taking the prednisone she notes she feels worse, the vision has improved but she does continue to have intermittent blurry vision. She is gaining weight and feels worse overall.   Review of Systems: Out of a complete 14 system review, the patient complains of only the following symptoms, and all other reviewed systems are negative. Positive neck pain fatigue, dizziness, headache, numbness  History   Social History  . Marital Status: Married    Spouse Name: Crystal Simon     Number of Children: 3  . Years of Education: 12   Occupational History  . Not on file.   Social History Main Topics  . Smoking status: Former Smoker    Quit date: 09/08/1994  . Smokeless tobacco: Never Used     Comment: Quit 09/2013  . Alcohol Use: No  . Drug Use: No  . Sexual Activity: Yes   Other Topics Concern  . Not on file   Social History Narrative   ** Merged History Encounter **       ** Data from: 05/25/12 Enc Dept:  Crystal Simon       ** Data from: 07/20/13 Enc Dept: Crystal Simon   Patient is married Crystal Simon).   Patient has 3 children.    Patient works at [employer] in [place].   Patient has high school education.   Caffeine consumption two cups daily        Family History  Problem Relation Age of Onset  . Heart disease Father   . Hypertension Mother   . Cancer Mother     SKIN  . Diabetes Mother   . Alzheimer's disease Maternal Grandmother   . Diabetes Maternal Grandmother   . Cancer Maternal Uncle     Crystal Simon    Past Medical History  Diagnosis Date  . Hypertension   . Heart palpitations   . Varicose veins   . Headache(784.0)   . Bulging discs 1998    cervical  . Gilbert syndrome since late teens  . Complication of anesthesia   . PONV (postoperative nausea and vomiting)   . Constipation   . MS (multiple sclerosis)     Past Surgical History  Procedure Laterality Date  . Abdominal hysterectomy    . Endovenous ablation saphenous vein w/ laser  summer 2011    right and left greater saphenous veins (Crystal Simon, Crystal Simon)    . Tubal ligation    . Dilation and curettage of uterus    . Vein ligation and stripping  05/07/2012    Procedure: VEIN LIGATION AND STRIPPING;  Surgeon: Crystal Posner, MD;  Location: [facility];  Service: Vascular;  Laterality: Left;  WITH STAB PHELBECTOMY  . Vaginal hysterectomy      Current Outpatient Prescriptions  Medication Sig Dispense Refill  . ALPRAZolam (XANAX) 0.5 MG tablet Take 1 tablet (0.5 mg total) by mouth at bedtime as needed for anxiety.  30 tablet  1  . Aspirin (ANACIN PO) Take 1 tablet by mouth daily as needed (pain).      . Cholecalciferol (VITAMIN D-3 PO) Take by mouth.      Crystal Simon Sodium (COLACE PO) Take by mouth.      . Esomeprazole Magnesium (NEXIUM PO) Take by mouth.      . Ibuprofen (ADVIL PO) Take by mouth.      . Ibuprofen-Diphenhydramine Cit (ADVIL PM PO) Take by mouth.      . Loratadine (CLARITIN PO) Take by mouth.      .  TECFIDERA 240 MG CPDR Take 240 mg by mouth 2 (two) times daily.       No current facility-administered medications for this visit.    Allergies as of 01/23/2014 - Review Complete 01/23/2014  Allergen Reaction Noted  . Prednisone Other (See Comments) 04/20/2012    Vitals: BP 137/86  Pulse 75  Ht 5\' 7"  (1.702 m)  Wt 189 lb (85.73 kg)  BMI 29.59 kg/m2 Last Weight:  Wt Readings from Last 1 Encounters:  01/23/14 189 lb (85.73 kg)   Last Height:   Ht Readings from Last 1 Encounters:  01/23/14 5\' 7"  (1.702 m)     Physical exam: Exam: Gen: NAD, conversant Eyes: anicteric sclerae, moist conjunctivae HENT: Atraumatic, oropharynx clear Neck: Trachea midline; supple,  Lungs: CTA, no wheezing, rales, rhonic                          CV: RRR, no MRG Abdomen: Soft, non-tender;  Extremities: No peripheral edema  Skin: Normal temperature, no rash,  Psych: Appropriate affect, pleasant  Neuro: MS: AA&Ox3, appropriately interactive, normal affect   Speech: fluent w/o paraphasic error  Memory: good recent and remote recall  CN: PERRL, EOMI no nystagmus, no ptosis, sensation intact to LT V1-V3 bilat, face symmetric, no weakness, hearing grossly intact, palate elevates symmetrically, shoulder shrug 5/5 bilat,  tongue protrudes midline, no fasiculations noted.  Conjunctival injection, restricted R superior VF, unable to fully visualize optic discs bilaterally due to pupil constriction  Motor: normal bulk and tone Strength: 5/5  In all extremities  Coord: rapid alternating and point-to-point (FNF, HTS) movements intact.  Reflexes: Brisk but symmetrical, bilat downgoing toes  Sens: LT intact in all extremities  Gait: posture, stance, stride and arm-swing normal. Tandem gait intact with multiple misteps. Able to walk on heels and toes. Romberg absent.   Assessment:  After physical and neurologic examination, review of laboratory studies, imaging, neurophysiology testing and  pre-existing records, assessment will be reviewed on the problem list.  Plan:  Treatment plan and additional workup will be reviewed under Problem List.  1)Multiple sclerosis 2)Dysphagia 3)onstipation  48 year old woman with MS presenting for follow up visit. Currently taking Tecfidera and overall tolerating it well. Presents today to review MRI which shows resolution of MS lesion with no new lesions. Based on prior history and MRI along with + oligoclonal bands in CSF still suspect that this represents a case of MS. Discussed different options with patient, will [facility] to [facility]  [facility] for 2nd opinion. Continue on Tecfidera for now. Follow up as needed.

## 2014-01-23 NOTE — Patient Instructions (Signed)
Overall you are doing fairly well but I do want to suggest a few things today:   Remember to drink plenty of fluid, eat healthy meals and do not skip any meals. Try to eat protein with a every meal and eat a healthy snack such as fruit or nuts in between meals. Try to keep a regular sleep-wake schedule and try to exercise daily, particularly in the form of walking, 20-30 minutes a day, if you can.   As far as your medications are concerned, I would like to suggest making no changes.   I will place a referral for you to see a MS specialist at [facility]  Follow up as needed. Please call us with any interim questions, concerns, problems, updates or refill requests.   My clinical assistant and will answer any of your questions and relay your messages to me and also relay most of my messages to you.   Our phone number is [phone]. We also have an after hours call service for urgent matters and there is a physician on-call for urgent questions. For any emergencies you know to call 911 or go to the nearest emergency room

## 2014-01-24 LAB — CBC
HEMATOCRIT: 39.5 % (ref 34.0–46.6)
HEMOGLOBIN: 13.5 g/dL (ref 11.1–15.9)
MCH: 29.9 pg (ref 26.6–33.0)
MCHC: 34.2 g/dL (ref 31.5–35.7)
MCV: 87 fL (ref 79–97)
Platelets: 245 10*3/uL (ref 150–379)
RBC: 4.52 x10E6/uL (ref 3.77–5.28)
RDW: 12.9 % (ref 12.3–15.4)
WBC: 4.7 10*3/uL (ref 3.4–10.8)

## 2014-01-24 LAB — HEPATIC FUNCTION PANEL
ALBUMIN: 4.6 g/dL (ref 3.5–5.5)
ALK PHOS: 85 IU/L (ref 39–117)
ALT: 14 IU/L (ref 0–32)
AST: 15 IU/L (ref 0–40)
BILIRUBIN DIRECT: 0.11 mg/dL (ref 0.00–0.40)
Total Bilirubin: 0.5 mg/dL (ref 0.0–1.2)
Total Protein: 7.1 g/dL (ref 6.0–8.5)

## 2014-02-06 ENCOUNTER — Ambulatory Visit: Payer: Federal, State, Local not specified - PPO | Admitting: Neurology

## 2014-02-21 ENCOUNTER — Other Ambulatory Visit: Payer: Self-pay | Admitting: Neurology

## 2014-02-22 DIAGNOSIS — Z0289 Encounter for other administrative examinations: Secondary | ICD-10-CM

## 2014-03-01 ENCOUNTER — Other Ambulatory Visit: Payer: Self-pay | Admitting: *Deleted

## 2014-03-01 ENCOUNTER — Other Ambulatory Visit: Payer: Self-pay | Admitting: Neurology

## 2014-03-01 ENCOUNTER — Ambulatory Visit (INDEPENDENT_AMBULATORY_CARE_PROVIDER_SITE_OTHER): Payer: Self-pay

## 2014-03-01 DIAGNOSIS — Z0289 Encounter for other administrative examinations: Secondary | ICD-10-CM

## 2014-03-01 DIAGNOSIS — G35 Multiple sclerosis: Secondary | ICD-10-CM

## 2014-03-02 DIAGNOSIS — Z0289 Encounter for other administrative examinations: Secondary | ICD-10-CM

## 2014-03-03 ENCOUNTER — Other Ambulatory Visit: Payer: Self-pay | Admitting: Adult Health

## 2014-03-03 DIAGNOSIS — Z0289 Encounter for other administrative examinations: Secondary | ICD-10-CM

## 2014-03-03 DIAGNOSIS — G35 Multiple sclerosis: Secondary | ICD-10-CM

## 2014-03-03 LAB — CBC
HEMATOCRIT: 38.4 % (ref 34.0–46.6)
Hemoglobin: 13.2 g/dL (ref 11.1–15.9)
MCH: 29.8 pg (ref 26.6–33.0)
MCHC: 34.4 g/dL (ref 31.5–35.7)
MCV: 87 fL (ref 79–97)
Platelets: 301 10*3/uL (ref 150–379)
RBC: 4.43 x10E6/uL (ref 3.77–5.28)
RDW: 13.4 % (ref 12.3–15.4)
WBC: 3.7 10*3/uL (ref 3.4–10.8)

## 2014-03-03 LAB — HEPATIC FUNCTION PANEL
ALT: 13 IU/L (ref 0–32)
AST: 15 IU/L (ref 0–40)
Albumin: 4.8 g/dL (ref 3.5–5.5)
Alkaline Phosphatase: 81 IU/L (ref 39–117)
Bilirubin, Direct: 0.19 mg/dL (ref 0.00–0.40)
TOTAL PROTEIN: 7.3 g/dL (ref 6.0–8.5)
Total Bilirubin: 0.8 mg/dL (ref 0.0–1.2)

## 2014-03-03 NOTE — Addendum Note (Signed)
Addended byOliver Hum on: 03/03/2014 08:48 AM   Modules accepted: Orders

## 2014-03-06 ENCOUNTER — Ambulatory Visit: Payer: Federal, State, Local not specified - PPO | Admitting: Physical Therapy

## 2014-03-22 ENCOUNTER — Other Ambulatory Visit: Payer: Self-pay | Admitting: Neurology

## 2014-05-09 ENCOUNTER — Ambulatory Visit: Payer: Federal, State, Local not specified - PPO | Admitting: Neurology

## 2014-05-30 ENCOUNTER — Telehealth: Payer: Self-pay | Admitting: *Deleted
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
## 2014-05-30 NOTE — Telephone Encounter (Signed)
Please call, refill ok for xanax .5 daily when necessary, #30 no refills. Review importance of not taking daily, addictive properties. Review counseling may be helpful, [facility] #[phone]. Please let her know I am sorry about  divorce. Was diagnosed with MS late 2014. Will  most likely confine be referred to  different neurologists in the practice. Dr. Brett Fairy is good at [facility] neurologic

## 2014-05-31 MED ORDER — ALPRAZOLAM 0.5 MG PO TABS: 0.5000 mg | ORAL_TABLET | Freq: Every evening | ORAL | Status: DC | PRN

## 2014-05-31 NOTE — Telephone Encounter (Signed)
rx called in, pt informed. Pt has upcoming appointment with neurologist.

## 2014-06-02 ENCOUNTER — Ambulatory Visit (INDEPENDENT_AMBULATORY_CARE_PROVIDER_SITE_OTHER): Payer: Federal, State, Local not specified - PPO | Admitting: Licensed Clinical Social Worker

## 2014-06-02 DIAGNOSIS — F331 Major depressive disorder, recurrent, moderate: Secondary | ICD-10-CM

## 2014-06-12 ENCOUNTER — Other Ambulatory Visit: Payer: Self-pay

## 2014-06-12 DIAGNOSIS — N63 Unspecified lump in unspecified breast: Secondary | ICD-10-CM

## 2014-06-14 ENCOUNTER — Ambulatory Visit: Payer: Federal, State, Local not specified - PPO | Admitting: Gynecology

## 2014-06-16 ENCOUNTER — Ambulatory Visit: Payer: Federal, State, Local not specified - PPO | Admitting: Licensed Clinical Social Worker

## 2014-06-27 ENCOUNTER — Encounter: Payer: Self-pay | Admitting: Gynecology

## 2014-06-27 ENCOUNTER — Ambulatory Visit (INDEPENDENT_AMBULATORY_CARE_PROVIDER_SITE_OTHER): Payer: Federal, State, Local not specified - PPO | Admitting: Gynecology

## 2014-06-27 DIAGNOSIS — N63 Unspecified lump in unspecified breast: Secondary | ICD-10-CM

## 2014-06-27 DIAGNOSIS — Z23 Encounter for immunization: Secondary | ICD-10-CM

## 2014-06-27 NOTE — Addendum Note (Signed)
Addended by: Nelva Nay on: 06/27/2014 05:13 PM   Modules accepted: Orders
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
## 2014-06-27 NOTE — Patient Instructions (Signed)
Office will contact you to arrange for mammogram and ultrasound.

## 2014-06-28 ENCOUNTER — Telehealth: Payer: Self-pay | Admitting: *Deleted

## 2014-06-28 DIAGNOSIS — N63 Unspecified lump in unspecified breast: Secondary | ICD-10-CM

## 2014-06-28 NOTE — Telephone Encounter (Signed)
Orders placed, breast center will contact her to schedule.
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
## 2014-06-29 NOTE — Telephone Encounter (Signed)
Appointment 07/12/14 @ 3:30pm

## 2014-07-10 ENCOUNTER — Encounter: Payer: Self-pay | Admitting: Gynecology

## 2014-07-12 ENCOUNTER — Ambulatory Visit
Admission: RE | Admit: 2014-07-12 | Discharge: 2014-07-12 | Disposition: A | Discharge status: Home / Self Care | Payer: Federal, State, Local not specified - PPO | Source: Ambulatory Visit | Attending: Gynecology | Admitting: Gynecology

## 2014-07-12 ENCOUNTER — Ambulatory Visit: Admission: RE | Admit: 2014-07-12 | Payer: Federal, State, Local not specified - PPO | Source: Ambulatory Visit

## 2014-07-12 DIAGNOSIS — N63 Unspecified lump in unspecified breast: Secondary | ICD-10-CM

## 2014-08-21 ENCOUNTER — Other Ambulatory Visit: Payer: Self-pay

## 2014-08-21 MED ORDER — ALPRAZOLAM 0.5 MG PO TABS: 0.5000 mg | ORAL_TABLET | Freq: Every evening | ORAL | Status: DC | PRN

## 2014-08-21 NOTE — Telephone Encounter (Signed)
Pharmacy said you wrote new Rx on 05/31/14 for it but she just recently filled it on 07/21/14.

## 2014-08-21 NOTE — Telephone Encounter (Signed)
Called into pharmacy

## 2014-08-21 NOTE — Telephone Encounter (Signed)
[place] for refill, it doesn't look like she takes often?  Do you see when last gotten rx?

## 2014-10-09 ENCOUNTER — Telehealth: Payer: Self-pay | Admitting: Neurology

## 2014-10-09 NOTE — Telephone Encounter (Signed)
Estill Bamberg with [facility] @ [phone], requesting Rx for TECFIDERA 240 MG CPDR.  Please call and advise.

## 2014-10-09 NOTE — Telephone Encounter (Signed)
I called back.  Spoke with Janett Billow.  They have one refill on file, which they will dispense.  They will call us back if anything further is needed.  I have added this pharmacy to the chart for future reference.

## 2014-10-21 ENCOUNTER — Other Ambulatory Visit: Payer: Self-pay | Admitting: Neurology

## 2014-10-24 ENCOUNTER — Encounter: Payer: Federal, State, Local not specified - PPO | Admitting: Women's Health

## 2014-11-14 ENCOUNTER — Other Ambulatory Visit: Payer: Self-pay

## 2014-11-14 MED ORDER — ALPRAZOLAM 0.5 MG PO TABS: 0.5000 mg | ORAL_TABLET | Freq: Every evening | ORAL | Status: DC | PRN

## 2014-11-14 NOTE — Telephone Encounter (Signed)
Called into pharmacy

## 2014-11-16 ENCOUNTER — Ambulatory Visit (INDEPENDENT_AMBULATORY_CARE_PROVIDER_SITE_OTHER): Payer: Federal, State, Local not specified - PPO | Admitting: Women's Health

## 2014-11-16 ENCOUNTER — Encounter: Payer: Self-pay | Admitting: Women's Health

## 2014-11-16 VITALS — BP 142/80 | Ht 67.0 in | Wt 200.0 lb

## 2014-11-16 DIAGNOSIS — Z79890 Hormone replacement therapy (postmenopausal): Secondary | ICD-10-CM

## 2014-11-16 DIAGNOSIS — Z01419 Encounter for gynecological examination (general) (routine) without abnormal findings: Secondary | ICD-10-CM | POA: Diagnosis not present

## 2014-11-16 DIAGNOSIS — Z113 Encounter for screening for infections with a predominantly sexual mode of transmission: Secondary | ICD-10-CM

## 2014-11-16 DIAGNOSIS — Z13220 Encounter for screening for lipoid disorders: Secondary | ICD-10-CM

## 2014-11-16 MED ORDER — ESTRADIOL 0.05 MG/24HR TD PTTW: 1.0000 | MEDICATED_PATCH | TRANSDERMAL | Status: DC

## 2014-11-16 MED ORDER — ALPRAZOLAM 0.5 MG PO TABS: 0.5000 mg | ORAL_TABLET | Freq: Every evening | ORAL | Status: DC | PRN

## 2014-11-16 NOTE — Patient Instructions (Signed)
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
## 2014-11-17 ENCOUNTER — Other Ambulatory Visit: Payer: Self-pay | Admitting: *Deleted

## 2014-11-17 DIAGNOSIS — Z13220 Encounter for screening for lipoid disorders: Secondary | ICD-10-CM

## 2014-11-17 DIAGNOSIS — Z01419 Encounter for gynecological examination (general) (routine) without abnormal findings: Secondary | ICD-10-CM

## 2014-11-17 DIAGNOSIS — Z1329 Encounter for screening for other suspected endocrine disorder: Secondary | ICD-10-CM

## 2014-11-17 LAB — URINALYSIS W MICROSCOPIC + REFLEX CULTURE
Bacteria, UA: NONE SEEN
Bilirubin Urine: NEGATIVE
CASTS: NONE SEEN
Crystals: NONE SEEN
Glucose, UA: NEGATIVE mg/dL
Hgb urine dipstick: NEGATIVE
Ketones, ur: NEGATIVE mg/dL
Leukocytes, UA: NEGATIVE
Nitrite: NEGATIVE
Protein, ur: NEGATIVE mg/dL
SQUAMOUS EPITHELIAL / LPF: NONE SEEN
Specific Gravity, Urine: 1.011 (ref 1.005–1.030)
Urobilinogen, UA: 0.2 mg/dL (ref 0.0–1.0)
pH: 7 (ref 5.0–8.0)

## 2014-11-17 LAB — GC/CHLAMYDIA PROBE AMP
CT Probe RNA: NEGATIVE
GC Probe RNA: NEGATIVE

## 2014-11-17 NOTE — Progress Notes (Signed)
Order(s) created erroneously. Erroneous order ID: [id number]  Order moved by: Willy Eddy  Order move date/time: 11/17/2014 2:27 PM  Source Patient: [id number]  Source Contact: 11/16/2014  Destination Patient: [id number]  Destination Contact: 11/19/2012

## 2014-11-17 NOTE — Progress Notes (Signed)
Order(s) created erroneously. Erroneous order ID: [id number]  Order moved by: Willy Eddy  Order move date/time: 11/17/2014 2:29 PM  Source Patient: [id number]  Source Contact: 11/16/2014  Destination Patient: [id number]  Destination Contact: 11/19/2012

## 2014-11-17 NOTE — Progress Notes (Signed)
Labs to be drawn on another day. The labs CBC and Vit D are followed by her doctor at [facility], results in [name]. To be drawn FLP, CMET and TSH. KW CMA

## 2014-11-17 NOTE — Progress Notes (Signed)
Order(s) created erroneously. Erroneous order ID: [id number]  Order moved by: Willy Eddy  Order move date/time: 11/17/2014 2:28 PM  Source Patient: [id number]  Source Contact: 11/16/2014  Destination Patient: [id number]  Destination Contact: 11/19/2012

## 2014-11-17 NOTE — Progress Notes (Signed)
Order(s) created erroneously. Erroneous order ID: [id number]  Order moved by: Willy Eddy  Order move date/time: 11/17/2014 2:29 PM  Source Patient: [id number]  Source Contact: 11/16/2014  Destination Patient: [id number]  Destination Contact: 11/19/2012

## 2014-11-17 NOTE — Progress Notes (Signed)
Order(s) created erroneously. Erroneous order ID: [id number]  Order moved by: Willy Eddy  Order move date/time: 11/17/2014 2:26 PM  Source Patient: [id number]  Source Contact: 11/16/2014  Destination Patient: [id number]  Destination Contact: 11/23/2012

## 2015-03-09 ENCOUNTER — Telehealth: Payer: Self-pay | Admitting: *Deleted

## 2015-03-09 DIAGNOSIS — K219 Gastro-esophageal reflux disease without esophagitis: Secondary | ICD-10-CM

## 2015-03-09 DIAGNOSIS — Z1211 Encounter for screening for malignant neoplasm of colon: Secondary | ICD-10-CM

## 2015-03-09 NOTE — Telephone Encounter (Signed)
Yes that would be fine as she had a colonoscopy?, Dr. Raquel James or Dr. Carlean Purl.

## 2015-03-09 NOTE — Telephone Encounter (Signed)
Appointment 03/13/15 @ 2:00pm with Dr.Jacobs, pt aware.

## 2015-03-09 NOTE — Telephone Encounter (Signed)
Pt called asking if she can have referral to [facility] for acid reflux, okay to place referral? Please advise

## 2015-03-13 ENCOUNTER — Ambulatory Visit (INDEPENDENT_AMBULATORY_CARE_PROVIDER_SITE_OTHER): Payer: Federal, State, Local not specified - PPO | Admitting: Gastroenterology

## 2015-03-13 ENCOUNTER — Encounter: Payer: Self-pay | Admitting: Gastroenterology

## 2015-03-13 VITALS — BP 120/80 | HR 82 | Ht 67.75 in | Wt 200.0 lb

## 2015-03-13 DIAGNOSIS — Z1211 Encounter for screening for malignant neoplasm of colon: Secondary | ICD-10-CM | POA: Diagnosis not present

## 2015-03-13 DIAGNOSIS — K219 Gastro-esophageal reflux disease without esophagitis: Secondary | ICD-10-CM

## 2015-03-13 MED ORDER — MOVIPREP 100 G PO SOLR: 1.0000 | Freq: Once | ORAL | Status: DC

## 2015-03-13 NOTE — Patient Instructions (Addendum)
You should change the way you are taking your antiacid medicine (nexium) so that you are taking it 20-30 minutes prior to a decent meal as that is the way the pill is designed to work most effectively. You will be set up for an upper endoscopy for GERD. You will be set up for a colonoscopy for colon cancer screening. Take miralax daily to prevent constipation.
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
## 2015-04-16 ENCOUNTER — Other Ambulatory Visit: Payer: Self-pay

## 2015-04-16 MED ORDER — ALPRAZOLAM 0.5 MG PO TABS: 0.5000 mg | ORAL_TABLET | Freq: Every evening | ORAL | Status: DC | PRN

## 2015-04-16 NOTE — Telephone Encounter (Signed)
Rx called in to pharmacy. 

## 2015-04-23 ENCOUNTER — Encounter: Payer: Self-pay | Admitting: Gastroenterology

## 2015-04-23 ENCOUNTER — Ambulatory Visit (AMBULATORY_SURGERY_CENTER): Payer: Federal, State, Local not specified - PPO | Admitting: Gastroenterology

## 2015-04-23 VITALS — BP 127/89 | HR 87 | Temp 98.2°F | Resp 23 | Ht 67.0 in | Wt 200.0 lb

## 2015-04-23 DIAGNOSIS — K219 Gastro-esophageal reflux disease without esophagitis: Secondary | ICD-10-CM

## 2015-04-23 DIAGNOSIS — Z1211 Encounter for screening for malignant neoplasm of colon: Secondary | ICD-10-CM

## 2015-04-23 MED ORDER — SODIUM CHLORIDE 0.9 % IV SOLN: 500.0000 mL | INTRAVENOUS | Status: DC

## 2015-04-23 MED ORDER — OMEPRAZOLE 40 MG PO CPDR: 40.0000 mg | DELAYED_RELEASE_CAPSULE | Freq: Every day | ORAL | Status: DC

## 2015-04-23 NOTE — Patient Instructions (Signed)
Discharge instructions given. Normal exam. Resume previous medications. YOU HAD AN ENDOSCOPIC PROCEDURE TODAY AT [facility]:   Refer to the procedure report that was given to you for any specific questions about what was found during the examination.  If the procedure report does not answer your questions, please call your gastroenterologist to clarify.  If you requested that your care partner not be given the details of your procedure findings, then the procedure report has been included in a sealed envelope for you to review at your convenience later.  YOU SHOULD EXPECT: Some feelings of bloating in the abdomen. Passage of more gas than usual.  Walking can help get rid of the air that was put into your GI tract during the procedure and reduce the bloating. If you had a lower endoscopy (such as a colonoscopy or flexible sigmoidoscopy) you may notice spotting of blood in your stool or on the toilet paper. If you underwent a bowel prep for your procedure, you may not have a normal bowel movement for a few days.  Please Note:  You might notice some irritation and congestion in your nose or some drainage.  This is from the oxygen used during your procedure.  There is no need for concern and it should clear up in a day or so.  SYMPTOMS TO REPORT IMMEDIATELY:   Following lower endoscopy (colonoscopy or flexible sigmoidoscopy):  Excessive amounts of blood in the stool  Significant tenderness or worsening of abdominal pains  Swelling of the abdomen that is new, acute  Fever of 100F or higher   Following upper endoscopy (EGD)  Vomiting of blood or coffee ground material  New chest pain or pain under the shoulder blades  Painful or persistently difficult swallowing  New shortness of breath  Fever of 100F or higher  Black, tarry-looking stools  For urgent or emergent issues, a gastroenterologist can be reached at any hour by calling [phone].   DIET: Your first meal  following the procedure should be a small meal and then it is ok to progress to your normal diet. Heavy or fried foods are harder to digest and may make you feel nauseous or bloated.  Likewise, meals heavy in dairy and vegetables can increase bloating.  Drink plenty of fluids but you should avoid alcoholic beverages for 24 hours.  ACTIVITY:  You should plan to take it easy for the rest of today and you should NOT DRIVE or use heavy machinery until tomorrow (because of the sedation medicines used during the test).    FOLLOW UP: Our staff will call the number listed on your records the next business day following your procedure to check on you and address any questions or concerns that you may have regarding the information given to you following your procedure. If we do not reach you, we will leave a message.  However, if you are feeling well and you are not experiencing any problems, there is no need to return our call.  We will assume that you have returned to your regular daily activities without incident.  If any biopsies were taken you will be contacted by phone or by letter within the next 1-3 weeks.  Please call us at [phone] if you have not heard about the biopsies in 3 weeks.    SIGNATURES/CONFIDENTIALITY: You and/or your care partner have signed paperwork which will be entered into your electronic medical record.  These signatures attest to the fact that that the information above  on your After Visit Summary has been reviewed and is understood.  Full responsibility of the confidentiality of this discharge information lies with you and/or your care-partner.

## 2015-04-23 NOTE — Op Note (Signed)
[place]  [address] [place], [id number]   ENDOSCOPY PROCEDURE REPORT  PATIENT: Crystal Simon, Crystal Simon  MR#: [id number] BIRTHDATE: 04/06/1966 , 49  yrs. old GENDER: female ENDOSCOPIST: Milus Banister, MD REFERRED BY:  Charlotte Sanes, M.D. PROCEDURE DATE:  04/23/2015 PROCEDURE:  EGD, diagnostic ASA CLASS:     Class II INDICATIONS:  GERD. MEDICATIONS: Monitored anesthesia care and Propofol 150 mg IV TOPICAL ANESTHETIC: none  DESCRIPTION OF PROCEDURE: After the risks benefits and alternatives of the procedure were thoroughly explained, informed consent was obtained.  The LB [id number] endoscope was introduced through the mouth and advanced to the second portion of the duodenum , Without limitations.  The instrument was slowly withdrawn as the mucosa was fully examined.   EXAM: The esophagus and gastroesophageal junction were completely normal in appearance.  The stomach was entered and closely examined.The antrum, angularis, and lesser curvature were well visualized, including a retroflexed view of the cardia and fundus. The stomach wall was normally distensable.  The scope passed easily through the pylorus into the duodenum.  Retroflexed views revealed no abnormalities.     The scope was then withdrawn from the patient and the procedure completed. COMPLICATIONS: There were no immediate complications.  ENDOSCOPIC IMPRESSION: Normal appearing esophagus and GE junction, the stomach was well visualized and normal in appearance, normal appearing duodenum  RECOMMENDATIONS: New prescription was called in today for nexium 40mg  pills.  Take one pill 20-30 min before BF every morning.  Continue taking tagamet at bedtime nightly.  Please call Dr.  Ardis Hughs' office in 6-7 weeks to report on your response to this and to the fiber supplement.   eSigned:  Milus Banister, MD 04/23/2015 2:38 PM

## 2015-04-23 NOTE — Op Note (Signed)
[place]  [address] [place], [address]   COLONOSCOPY PROCEDURE REPORT  PATIENT: Crystal Simon, Crystal Simon  MR#: [id number] BIRTHDATE: 1966-06-17 , 49  yrs. old GENDER: female ENDOSCOPIST: Milus Banister, MD REFERRED BY:Jim Spero Curb, M.D. PROCEDURE DATE:  04/23/2015 PROCEDURE:   Colonoscopy, screening First Screening Colonoscopy - Avg.  risk and is 50 yrs.  old or older Yes.  Prior Negative Screening - Now for repeat screening. N/A  History of Adenoma - Now for follow-up colonoscopy & has been > or = to 3 yrs.  N/A  Recommend repeat exam, <10 yrs? No ASA CLASS:   Class II INDICATIONS:Screening for colonic neoplasia and Colorectal Neoplasm Risk Assessment for this procedure is average risk. MEDICATIONS: Monitored anesthesia care and Propofol 450 mg IV  DESCRIPTION OF PROCEDURE:   After the risks benefits and alternatives of the procedure were thoroughly explained, informed consent was obtained.  The digital rectal exam revealed no abnormalities of the rectum.   The LB [id number]  endoscope was introduced through the anus and advanced to the cecum, which was identified by both the appendix and ileocecal valve. No adverse events experienced.   The quality of the prep was excellent.  The instrument was then slowly withdrawn as the colon was fully examined. Estimated blood loss is zero unless otherwise noted in this procedure report.   COLON FINDINGS: A normal appearing cecum, ileocecal valve, and appendiceal orifice were identified.  The ascending, transverse, descending, sigmoid colon, and rectum appeared unremarkable. Retroflexed views revealed no abnormalities. The time to cecum = 5.1 Withdrawal time = 6.5   The scope was withdrawn and the procedure completed. COMPLICATIONS: There were no immediate complications.  ENDOSCOPIC IMPRESSION: Normal colonoscopy No polyps or cancers  RECOMMENDATIONS: You should continue to follow colorectal cancer screening  guidelines for "routine risk" patients with a repeat colonoscopy in 10 years.  Please start once daily Citrucel (orange flavored powder)  eSigned:  Milus Banister, MD 04/23/2015 2:32 PM

## 2015-04-23 NOTE — Progress Notes (Signed)
Report to PACU, RN, vss, BBS= Clear.  

## 2015-04-24 ENCOUNTER — Telehealth: Payer: Self-pay

## 2015-04-24 NOTE — Telephone Encounter (Signed)
Left a message at [phone] for the pt to call us back if any questions or concerns. maw

## 2015-05-28 ENCOUNTER — Other Ambulatory Visit: Payer: Self-pay

## 2015-05-28 MED ORDER — ALPRAZOLAM 0.5 MG PO TABS: 0.5000 mg | ORAL_TABLET | Freq: Every evening | ORAL | Status: DC | PRN

## 2015-05-28 NOTE — Telephone Encounter (Signed)
Please call, did she refill 6 wks ago?  Review to use sparingly, not daily.  [place] for refill

## 2015-05-28 NOTE — Telephone Encounter (Signed)
Ok for refill? 

## 2015-05-28 NOTE — Telephone Encounter (Signed)
Crystal Simon, On 8/8 you prescribed #30 with one refill.  She filled that on 04/20/15 for #30. Then on 05/25/15 she received the refill for #30.  Cayey said there was a note on this request that it was filled today and this refill requested will be held for patient until needed if you okay it now. Please advise.

## 2015-05-28 NOTE — Telephone Encounter (Signed)
Rx called in to pharmacy. 

## 2015-05-31 DIAGNOSIS — Z0289 Encounter for other administrative examinations: Secondary | ICD-10-CM

## 2015-08-13 ENCOUNTER — Other Ambulatory Visit: Payer: Self-pay

## 2015-08-13 MED ORDER — ALPRAZOLAM 0.5 MG PO TABS: 0.5000 mg | ORAL_TABLET | Freq: Every evening | ORAL | Status: DC | PRN

## 2015-08-13 NOTE — Telephone Encounter (Signed)
Called into pharmacy

## 2015-09-24 ENCOUNTER — Other Ambulatory Visit: Payer: Self-pay

## 2015-09-24 MED ORDER — ALPRAZOLAM 0.5 MG PO TABS: 0.5000 mg | ORAL_TABLET | Freq: Every evening | ORAL | Status: DC | PRN

## 2015-09-24 NOTE — Telephone Encounter (Signed)
Called into pharmacy

## 2015-09-24 NOTE — Telephone Encounter (Signed)
[place] for refill, remind best to use sparingly, addictive.

## 2015-10-01 ENCOUNTER — Other Ambulatory Visit: Payer: Self-pay | Admitting: Orthopedic Surgery

## 2015-10-01 DIAGNOSIS — M75111 Incomplete rotator cuff tear or rupture of right shoulder, not specified as traumatic: Secondary | ICD-10-CM

## 2015-10-01 DIAGNOSIS — M75101 Unspecified rotator cuff tear or rupture of right shoulder, not specified as traumatic: Secondary | ICD-10-CM

## 2015-10-08 ENCOUNTER — Ambulatory Visit
Admission: RE | Admit: 2015-10-08 | Discharge: 2015-10-08 | Disposition: A | Discharge status: Home / Self Care | Payer: Federal, State, Local not specified - PPO | Source: Ambulatory Visit | Attending: Orthopedic Surgery | Admitting: Orthopedic Surgery

## 2015-10-08 DIAGNOSIS — M75111 Incomplete rotator cuff tear or rupture of right shoulder, not specified as traumatic: Secondary | ICD-10-CM

## 2015-10-08 DIAGNOSIS — M75101 Unspecified rotator cuff tear or rupture of right shoulder, not specified as traumatic: Secondary | ICD-10-CM

## 2015-11-21 ENCOUNTER — Encounter: Payer: Federal, State, Local not specified - PPO | Admitting: Women's Health

## 2015-12-06 ENCOUNTER — Ambulatory Visit (INDEPENDENT_AMBULATORY_CARE_PROVIDER_SITE_OTHER): Payer: Federal, State, Local not specified - PPO | Admitting: Women's Health

## 2015-12-06 ENCOUNTER — Encounter: Payer: Self-pay | Admitting: Women's Health

## 2015-12-06 VITALS — BP 126/80 | Ht 67.0 in | Wt 211.0 lb

## 2015-12-06 DIAGNOSIS — Z1329 Encounter for screening for other suspected endocrine disorder: Secondary | ICD-10-CM

## 2015-12-06 DIAGNOSIS — Z01419 Encounter for gynecological examination (general) (routine) without abnormal findings: Secondary | ICD-10-CM

## 2015-12-06 DIAGNOSIS — Z13220 Encounter for screening for lipoid disorders: Secondary | ICD-10-CM | POA: Diagnosis not present

## 2015-12-06 DIAGNOSIS — Z79890 Hormone replacement therapy (postmenopausal): Secondary | ICD-10-CM

## 2015-12-06 LAB — VITAMIN B12: VITAMIN B 12: 778 pg/mL (ref 200–1100)

## 2015-12-06 LAB — COMPREHENSIVE METABOLIC PANEL
ALBUMIN: 4.7 g/dL (ref 3.6–5.1)
ALK PHOS: 92 U/L (ref 33–130)
ALT: 16 U/L (ref 6–29)
AST: 14 U/L (ref 10–35)
BILIRUBIN TOTAL: 0.5 mg/dL (ref 0.2–1.2)
BUN: 17 mg/dL (ref 7–25)
CHLORIDE: 105 mmol/L (ref 98–110)
CO2: 24 mmol/L (ref 20–31)
CREATININE: 0.84 mg/dL (ref 0.50–1.05)
Calcium: 10 mg/dL (ref 8.6–10.4)
Glucose, Bld: 90 mg/dL (ref 65–99)
Potassium: 4.5 mmol/L (ref 3.5–5.3)
SODIUM: 139 mmol/L (ref 135–146)
TOTAL PROTEIN: 7.5 g/dL (ref 6.1–8.1)

## 2015-12-06 LAB — CBC WITH DIFFERENTIAL/PLATELET
BASOS ABS: 0.1 10*3/uL (ref 0.0–0.1)
BASOS PCT: 1 % (ref 0–1)
EOS ABS: 0.2 10*3/uL (ref 0.0–0.7)
EOS PCT: 4 % (ref 0–5)
HCT: 41.2 % (ref 36.0–46.0)
Hemoglobin: 13.6 g/dL (ref 12.0–15.0)
Lymphocytes Relative: 23 % (ref 12–46)
Lymphs Abs: 1.3 10*3/uL (ref 0.7–4.0)
MCH: 29.4 pg (ref 26.0–34.0)
MCHC: 33 g/dL (ref 30.0–36.0)
MCV: 89.2 fL (ref 78.0–100.0)
MPV: 10.5 fL (ref 8.6–12.4)
Monocytes Absolute: 0.3 10*3/uL (ref 0.1–1.0)
Monocytes Relative: 5 % (ref 3–12)
Neutro Abs: 3.9 10*3/uL (ref 1.7–7.7)
Neutrophils Relative %: 67 % (ref 43–77)
PLATELETS: 309 10*3/uL (ref 150–400)
RBC: 4.62 MIL/uL (ref 3.87–5.11)
RDW: 13.2 % (ref 11.5–15.5)
WBC: 5.8 10*3/uL (ref 4.0–10.5)

## 2015-12-06 LAB — LIPID PANEL
Cholesterol: 202 mg/dL — ABNORMAL HIGH (ref 125–200)
HDL: 54 mg/dL (ref >=46)
LDL Cholesterol: 130 mg/dL — ABNORMAL HIGH (ref <130)
Total CHOL/HDL Ratio: 3.7 Ratio (ref <=5.0)
Triglycerides: 90 mg/dL (ref <150)
VLDL: 18 mg/dL (ref <30)

## 2015-12-06 LAB — TSH: TSH: 1.24 mIU/L

## 2015-12-06 MED ORDER — ESTRADIOL 0.05 MG/24HR TD PTTW: 1.0000 | MEDICATED_PATCH | TRANSDERMAL | Status: DC

## 2015-12-06 NOTE — Patient Instructions (Signed)
Menopause is a normal process in which your reproductive ability comes to an end. This process happens gradually over a span of months to years, usually between the ages of 48 and 55. Menopause is complete when you have missed 12 consecutive menstrual periods. It is important to talk with your health care provider about some of the most common conditions that affect postmenopausal women, such as heart disease, cancer, and bone loss (osteoporosis). Adopting a healthy lifestyle and getting preventive care can help to promote your health and wellness. Those actions can also lower your chances of developing some of these common conditions. WHAT SHOULD I KNOW ABOUT MENOPAUSE? During menopause, you may experience a number of symptoms, such as:  Moderate-to-severe hot flashes.  Night sweats.  Decrease in sex drive.  Mood swings.  Headaches.  Tiredness.  Irritability.  Memory problems.  Insomnia. Choosing to treat or not to treat menopausal changes is an individual decision that you make with your health care provider. WHAT SHOULD I KNOW ABOUT HORMONE REPLACEMENT THERAPY AND SUPPLEMENTS? Hormone therapy products are effective for treating symptoms that are associated with menopause, such as hot flashes and night sweats. Hormone replacement carries certain risks, especially as you become older. If you are thinking about using estrogen or estrogen with progestin treatments, discuss the benefits and risks with your health care provider. WHAT SHOULD I KNOW ABOUT HEART DISEASE AND STROKE? Heart disease, heart attack, and stroke become more likely as you age. This may be due, in part, to the hormonal changes that your body experiences during menopause. These can affect how your body processes dietary fats, triglycerides, and cholesterol. Heart attack and stroke are both medical emergencies. There are many things that you can do to help prevent heart disease and stroke:  Have your blood pressure  checked at least every 1-2 years. High blood pressure causes heart disease and increases the risk of stroke.  If you are 55-79 years old, ask your health care provider if you should take aspirin to prevent a heart attack or a stroke.  Do not use any tobacco products, including cigarettes, chewing tobacco, or electronic cigarettes. If you need help quitting, ask your health care provider.  It is important to eat a healthy diet and maintain a healthy weight.  Be sure to include plenty of vegetables, fruits, low-fat dairy products, and lean protein.  Avoid eating foods that are high in solid fats, added sugars, or salt (sodium).  Get regular exercise. This is one of the most important things that you can do for your health.  Try to exercise for at least 150 minutes each week. The type of exercise that you do should increase your heart rate and make you sweat. This is known as moderate-intensity exercise.  Try to do strengthening exercises at least twice each week. Do these in addition to the moderate-intensity exercise.  Know your numbers.Ask your health care provider to check your cholesterol and your blood glucose. Continue to have your blood tested as directed by your health care provider. WHAT SHOULD I KNOW ABOUT CANCER SCREENING? There are several types of cancer. Take the following steps to reduce your risk and to catch any cancer development as early as possible. Breast Cancer  Practice breast self-awareness.  This means understanding how your breasts normally appear and feel.  It also means doing regular breast self-exams. Let your health care provider know about any changes, no matter how small.  If you are 40 or older, have a clinician do a   breast exam (clinical breast exam or CBE) every year. Depending on your age, family history, and medical history, it may be recommended that you also have a yearly breast X-ray (mammogram).  If you have a family history of breast cancer,  talk with your health care provider about genetic screening.  If you are at high risk for breast cancer, talk with your health care provider about having an MRI and a mammogram every year.  Breast cancer (BRCA) gene test is recommended for women who have family members with BRCA-related cancers. Results of the assessment will determine the need for genetic counseling and BRCA1 and for BRCA2 testing. BRCA-related cancers include these types:  Breast. This occurs in males or females.  Ovarian.  Tubal. This may also be called fallopian tube cancer.  Cancer of the abdominal or pelvic lining (peritoneal cancer).  Prostate.  Pancreatic. Cervical, Uterine, and Ovarian Cancer Your health care provider may recommend that you be screened regularly for cancer of the pelvic organs. These include your ovaries, uterus, and vagina. This screening involves a pelvic exam, which includes checking for microscopic changes to the surface of your cervix (Pap test).  For women ages 21-65, health care providers may recommend a pelvic exam and a Pap test every three years. For women ages 77-65, they may recommend the Pap test and pelvic exam, combined with testing for human papilloma virus (HPV), every five years. Some types of HPV increase your risk of cervical cancer. Testing for HPV may also be done on women of any age who have unclear Pap test results.  Other health care providers may not recommend any screening for nonpregnant women who are considered low risk for pelvic cancer and have no symptoms. Ask your health care provider if a screening pelvic exam is right for you.  If you have had past treatment for cervical cancer or a condition that could lead to cancer, you need Pap tests and screening for cancer for at least 20 years after your treatment. If Pap tests have been discontinued for you, your risk factors (such as having a new sexual partner) need to be reassessed to determine if you should start having  screenings again. Some women have medical problems that increase the chance of getting cervical cancer. In these cases, your health care provider may recommend that you have screening and Pap tests more often.  If you have a family history of uterine cancer or ovarian cancer, talk with your health care provider about genetic screening.  If you have vaginal bleeding after reaching menopause, tell your health care provider.  There are currently no reliable tests available to screen for ovarian cancer. Lung Cancer Lung cancer screening is recommended for adults 3-70 years old who are at high risk for lung cancer because of a history of smoking. A yearly low-dose CT scan of the lungs is recommended if you:  Currently smoke.  Have a history of at least 30 pack-years of smoking and you currently smoke or have quit within the past 15 years. A pack-year is smoking an average of one pack of cigarettes per day for one year. Yearly screening should:  Continue until it has been 15 years since you quit.  Stop if you develop a health problem that would prevent you from having lung cancer treatment. Colorectal Cancer  This type of cancer can be detected and can often be prevented.  Routine colorectal cancer screening usually begins at age 38 and continues through age 12.  If you have  risk factors for colon cancer, your health care provider may recommend that you be screened at an earlier age.  If you have a family history of colorectal cancer, talk with your health care provider about genetic screening.  Your health care provider may also recommend using home test kits to check for hidden blood in your stool.  A small camera at the end of a tube can be used to examine your colon directly (sigmoidoscopy or colonoscopy). This is done to check for the earliest forms of colorectal cancer.  Direct examination of the colon should be repeated every 5-10 years until age 67. However, if early forms of  precancerous polyps or small growths are found or if you have a family history or genetic risk for colorectal cancer, you may need to be screened more often. Skin Cancer  Check your skin from head to toe regularly.  Monitor any moles. Be sure to tell your health care provider:  About any new moles or changes in moles, especially if there is a change in a mole's shape or color.  If you have a mole that is larger than the size of a pencil eraser.  If any of your family members has a history of skin cancer, especially at a Pasqualino Witherspoon age, talk with your health care provider about genetic screening.  Always use sunscreen. Apply sunscreen liberally and repeatedly throughout the day.  Whenever you are outside, protect yourself by wearing long sleeves, pants, a wide-brimmed hat, and sunglasses. WHAT SHOULD I KNOW ABOUT OSTEOPOROSIS? Osteoporosis is a condition in which bone destruction happens more quickly than new bone creation. After menopause, you may be at an increased risk for osteoporosis. To help prevent osteoporosis or the bone fractures that can happen because of osteoporosis, the following is recommended:  If you are 39-61 years old, get at least 1,000 mg of calcium and at least 600 mg of vitamin D per day.  If you are older than age 16 but younger than age 7, get at least 1,200 mg of calcium and at least 600 mg of vitamin D per day.  If you are older than age 47, get at least 1,200 mg of calcium and at least 800 mg of vitamin D per day. Smoking and excessive alcohol intake increase the risk of osteoporosis. Eat foods that are rich in calcium and vitamin D, and do weight-bearing exercises several times each week as directed by your health care provider. WHAT SHOULD I KNOW ABOUT HOW MENOPAUSE AFFECTS Crystal Simon? Depression may occur at any age, but it is more common as you become older. Common symptoms of depression include:  Low or sad mood.  Changes in sleep patterns.  Changes  in appetite or eating patterns.  Feeling an overall lack of motivation or enjoyment of activities that you previously enjoyed.  Frequent crying spells. Talk with your health care provider if you think that you are experiencing depression. WHAT SHOULD I KNOW ABOUT IMMUNIZATIONS? It is important that you get and maintain your immunizations. These include:  Tetanus, diphtheria, and pertussis (Tdap) booster vaccine.  Influenza every year before the flu season begins.  Pneumonia vaccine.  Shingles vaccine. Your health care provider may also recommend other immunizations.   This information is not intended to replace advice given to you by your health care provider. Make sure you discuss any questions you have with your health care provider.   Document Released: 10/17/2005 Document Revised: 09/15/2014 Document Reviewed: 04/27/2014 Elsevier Interactive Patient Education 2016 Else[employer] Basic Carbohydrate Counting for Diabetes Mellitus Carbohydrate counting is a method for keeping track of the amount of carbohydrates you eat. Eating carbohydrates naturally increases the level of sugar (glucose) in your blood, so it is important for you to know the amount that is okay for you to have in every meal. Carbohydrate counting helps keep the level of glucose in your blood within normal limits. The amount of carbohydrates allowed is different for every person. A dietitian can help you calculate the amount that is right for you. Once you know the amount of carbohydrates you can have, you can count the carbohydrates in the foods you want to eat. Carbohydrates are found in the following foods:  Grains, such as breads and cereals.  Dried beans and soy products.  Starchy vegetables, such as potatoes, peas, and corn.  Fruit and fruit juices.  Milk and yogurt.  Sweets and snack foods, such as cake, cookies, candy, chips, soft drinks, and fruit drinks. CARBOHYDRATE COUNTING There are two ways to  count the carbohydrates in your food. You can use either of the methods or a combination of both. Reading the "Nutrition Facts" on Packaged Food The "Nutrition Facts" is an area that is included on the labels of almost all packaged food and beverages in the [place]. It includes the serving size of that food or beverage and information about the nutrients in each serving of the food, including the grams (g) of carbohydrate per serving.  Decide the number of servings of this food or beverage that you will be able to eat or drink. Multiply that number of servings by the number of grams of carbohydrate that is listed on the label for that serving. The total will be the amount of carbohydrates you will be having when you eat or drink this food or beverage. Learning Standard Serving Sizes of Food When you eat food that is not packaged or does not include "Nutrition Facts" on the label, you need to measure the servings in order to count the amount of carbohydrates.A serving of most carbohydrate-rich foods contains about 15 g of carbohydrates. The following list includes serving sizes of carbohydrate-rich foods that provide 15 g ofcarbohydrate per serving:   1 slice of bread (1 oz) or 1 six-inch tortilla.    of a hamburger bun or English muffin.  4-6 crackers.   cup unsweetened dry cereal.    cup hot cereal.   cup rice or pasta.    cup mashed potatoes or  of a large baked potato.  1 cup fresh fruit or one small piece of fruit.    cup canned or frozen fruit or fruit juice.  1 cup milk.   cup plain fat-free yogurt or yogurt sweetened with artificial sweeteners.   cup cooked dried beans or starchy vegetable, such as peas, corn, or potatoes.  Decide the number of standard-size servings that you will eat. Multiply that number of servings by 15 (the grams of carbohydrates in that serving). For example, if you eat 2 cups of strawberries, you will have eaten 2 servings and 30 g of  carbohydrates (2 servings x 15 g = 30 g). For foods such as soups and casseroles, in which more than one food is mixed in, you will need to count the carbohydrates in each food that is included. EXAMPLE OF CARBOHYDRATE COUNTING Sample Dinner  3 oz chicken breast.   cup of brown rice.   cup of corn.  1 cup milk.   1 cup   strawberries with sugar-free whipped topping.  Carbohydrate Calculation Step 1: Identify the foods that contain carbohydrates:   Rice.   Corn.   Milk.   Strawberries. Step 2:Calculate the number of servings eaten of each:   2 servings of rice.   1 serving of corn.   1 serving of milk.   1 serving of strawberries. Step 3: Multiply each of those number of servings by 15 g:   2 servings of rice x 15 g = 30 g.   1 serving of corn x 15 g = 15 g.   1 serving of milk x 15 g = 15 g.   1 serving of strawberries x 15 g = 15 g. Step 4: Add together all of the amounts to find the total grams of carbohydrates eaten: 30 g + 15 g + 15 g + 15 g = 75 g.   This information is not intended to replace advice given to you by your health care provider. Make sure you discuss any questions you have with your health care provider.   Document Released: 08/25/2005 Document Revised: 09/15/2014 Document Reviewed: 07/22/2013 Elsevier Interactive Patient Education [employer].
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
## 2015-12-07 LAB — URINALYSIS W MICROSCOPIC + REFLEX CULTURE
BACTERIA UA: NONE SEEN [HPF]
BILIRUBIN URINE: NEGATIVE
CRYSTALS: NONE SEEN [HPF]
Casts: NONE SEEN [LPF]
GLUCOSE, UA: NEGATIVE
HGB URINE DIPSTICK: NEGATIVE
Ketones, ur: NEGATIVE
LEUKOCYTES UA: NEGATIVE
Nitrite: NEGATIVE
PROTEIN: NEGATIVE
RBC / HPF: NONE SEEN RBC/HPF (ref <=2)
Specific Gravity, Urine: 1.005 (ref 1.001–1.035)
WBC, UA: NONE SEEN WBC/HPF (ref <=5)
Yeast: NONE SEEN [HPF]
pH: 6.5 (ref 5.0–8.0)

## 2015-12-07 LAB — VITAMIN D 25 HYDROXY (VIT D DEFICIENCY, FRACTURES): VIT D 25 HYDROXY: 39 ng/mL (ref 30–100)

## 2016-01-04 ENCOUNTER — Other Ambulatory Visit: Payer: Self-pay | Admitting: *Deleted

## 2016-01-04 MED ORDER — ALPRAZOLAM 0.5 MG PO TABS: 0.5000 mg | ORAL_TABLET | Freq: Every evening | ORAL | Status: DC | PRN

## 2016-01-04 NOTE — Telephone Encounter (Signed)
Rx called in 

## 2016-01-04 NOTE — Telephone Encounter (Signed)
Last filled on 09/24/15 with 1 refill.

## 2016-01-04 NOTE — Telephone Encounter (Signed)
Ok for refill? 

## 2016-02-15 ENCOUNTER — Other Ambulatory Visit: Payer: Self-pay | Admitting: Gynecology

## 2016-02-15 DIAGNOSIS — Z1231 Encounter for screening mammogram for malignant neoplasm of breast: Secondary | ICD-10-CM

## 2016-02-28 ENCOUNTER — Ambulatory Visit
Admission: RE | Admit: 2016-02-28 | Discharge: 2016-02-28 | Disposition: A | Discharge status: Home / Self Care | Payer: Federal, State, Local not specified - PPO | Source: Ambulatory Visit | Attending: Gynecology | Admitting: Gynecology

## 2016-02-28 DIAGNOSIS — Z1231 Encounter for screening mammogram for malignant neoplasm of breast: Secondary | ICD-10-CM

## 2016-04-30 ENCOUNTER — Other Ambulatory Visit: Payer: Self-pay

## 2016-04-30 MED ORDER — ALPRAZOLAM 0.5 MG PO TABS: 0.5000 mg | ORAL_TABLET | Freq: Every evening | ORAL | 1 refills | Status: DC | PRN

## 2016-04-30 NOTE — Telephone Encounter (Signed)
Okay for refill?  

## 2016-05-01 NOTE — Telephone Encounter (Signed)
Rx called in to pharmacy. 

## 2016-05-09 ENCOUNTER — Telehealth: Payer: Self-pay | Admitting: Gastroenterology

## 2016-05-09 ENCOUNTER — Other Ambulatory Visit: Payer: Self-pay

## 2016-05-09 MED ORDER — RANITIDINE HCL 150 MG PO TABS: 150.0000 mg | ORAL_TABLET | Freq: Every day | ORAL | 0 refills | Status: DC

## 2016-05-09 MED ORDER — OMEPRAZOLE 40 MG PO CPDR: 40.0000 mg | DELAYED_RELEASE_CAPSULE | Freq: Every day | ORAL | 11 refills | Status: DC

## 2016-05-09 NOTE — Telephone Encounter (Signed)
prescription sent per pt request

## 2016-05-09 NOTE — Telephone Encounter (Signed)
Returned call and would like omeprazole sent as well, this has been sent

## 2016-06-17 ENCOUNTER — Telehealth: Payer: Self-pay | Admitting: *Deleted

## 2016-06-17 NOTE — Telephone Encounter (Signed)
Pt called regarding bleeding hemorrhoids asked who should she see regarding this I advised pt best to call GI MD who is Dr.Jacobs. Pt will call him

## 2016-07-29 ENCOUNTER — Other Ambulatory Visit: Payer: Self-pay | Admitting: *Deleted

## 2016-07-29 MED ORDER — ALPRAZOLAM 0.5 MG PO TABS: 0.5000 mg | ORAL_TABLET | Freq: Every evening | ORAL | 1 refills | Status: DC | PRN

## 2016-07-29 NOTE — Telephone Encounter (Signed)
Rx called in 

## 2016-07-29 NOTE — Telephone Encounter (Signed)
Last filled on 06/06/16

## 2016-07-29 NOTE — Telephone Encounter (Signed)
Okay for refill?  

## 2016-08-13 ENCOUNTER — Encounter: Payer: Self-pay | Admitting: Neurology

## 2016-08-13 ENCOUNTER — Ambulatory Visit (INDEPENDENT_AMBULATORY_CARE_PROVIDER_SITE_OTHER): Payer: Federal, State, Local not specified - PPO | Admitting: Neurology

## 2016-08-13 VITALS — BP 130/76 | HR 86 | Ht 69.0 in | Wt 204.0 lb

## 2016-08-13 DIAGNOSIS — G35 Multiple sclerosis: Secondary | ICD-10-CM

## 2016-08-13 MED ORDER — DIMETHYL FUMARATE 120 MG PO CPDR: 240.0000 mg | DELAYED_RELEASE_CAPSULE | Freq: Every day | ORAL | 0 refills | Status: DC

## 2016-08-13 NOTE — Progress Notes (Addendum)
NEUROLOGY CONSULTATION NOTE  Crystal Simon MRN: [id number] DOB: [birth year]  Referring provider: Dr. Spero Curb Primary care provider: Dr. Spero Curb  Reason for consult:  Establish care for multiple sclerosis  HISTORY OF PRESENT ILLNESS: Crystal Simon is a 50 year old right-handed female who presents to establish care for multiple sclerosis.  History supplemented by prior neurologist's notes.    Initial symptom in 2012 presented with 1 week episode of vertigo with feeling off-balance.  This was followed by intermittent numbness and tingling of her hands and feet.  In November 2014, she developed double vision associated with slurred speech, right sided numbness and difficulty swallowing (the right side of her mouth, tongue and throat felt numb).  MRI of the brain with and without contrast from 07/12/13 was personally reviewed and showed hyperintense T2 lesions in the on left side of the midbrain.  MRI of cervical spine from 08/02/13 was personally reviewed and was negative for demyelinating lesions.  She underwent a lumbar puncture in January 2015.  CSF cell count was 1, protein 33, glucose 56, 3 oligoclonal bands (not present in serum), negative myelin basic protein, IgG index 0.52.  NMO antibody was negative.  She was initially started on Tecfidera in February 2015.  She had done well on Tecfidera.  She never had any acute clinical relapses and repeat brain imaging was negative (resolution of previous brainstem lesion).    Repeat MRI of the brain with and without contrast from 01/18/14 was reportedly normal with resolution of the previous midbrain lesion (report available but not images).  Most recent MRI of brain with and without contrast from 10/09/15 was reportedly again unremarkable and showed no significant white matter disease.  Known 6 mm pineal cyst was noted.  At baseline, she had paresthesias of the right side of her face and throat.  She also has paresthesias in the upper extremities and  toes, worse on the right.  Sometimes she needs to use a cane. Heat (such as during the summer months), stress and lack of sleep exacerbate chronic symptoms.  She also has restless leg syndrome.  She takes gabapentin, D3 2000 IU daily and B12 1038mcg daily.  Even though the Tecfidera works well, she has significant GERD.  Therefore she was discussing about switching therapy with her prior neurologist back in August, considering Gilenya.  Labs from 04/09/16: CBC with WBC 7.6, HGB 14.2, HCT 43.2, PLT 265 and ALC 1.7 Hepatic panel with total bili 0.7, ALP 87, AST 15, ALT 19 VZV antibody positive JC Virus antibody positive with index of 0.55. B12 level 282 (has since started B12) D level 43.  PAST MEDICAL HISTORY: Past Medical History:  Diagnosis Date  . Anxiety   . Bulging discs 1998   cervical  . Complication of anesthesia   . Constipation   . [name] syndrome since late teens  . Headache(784.0)   . Heart palpitations   . Hypertension   . MS (multiple sclerosis) ([place])   . PONV (postoperative nausea and vomiting)     PAST SURGICAL HISTORY: Past Surgical History:  Procedure Laterality Date  . ABDOMINAL HYSTERECTOMY    . DILATION AND CURETTAGE OF UTERUS    . ENDOVENOUS ABLATION SAPHENOUS VEIN W/ LASER  summer 2011   right and left greater saphenous veins ([place], [place])    . SHOULDER SURGERY     ligament reattachment  . TUBAL LIGATION    . VAGINAL HYSTERECTOMY    . VEIN LIGATION AND STRIPPING  05/07/2012   Procedure: VEIN  LIGATION AND STRIPPING;  Surgeon: Rosetta Posner, MD;  Location: [facility] OR;  Service: Vascular;  Laterality: Left;  WITH STAB PHELBECTOMY    MEDICATIONS: Current Outpatient Prescriptions on File Prior to Visit  Medication Sig Dispense Refill  . acetaminophen (TYLENOL) 325 MG tablet Take 650 mg by mouth every 6 (six) hours as needed.    . ALPRAZolam (XANAX) 0.5 MG tablet Take 1 tablet (0.5 mg total) by mouth at bedtime as needed for anxiety. 30 tablet 1  . Ascorbic  Acid (VITAMIN C) 1000 MG tablet Take 1,000 mg by mouth daily. Reported on 12/06/2015    . aspirin 81 MG tablet Take 81 mg by mouth daily.    . Cholecalciferol (VITAMIN D-3 PO) Take by mouth.    . cimetidine (TAGAMET) 200 MG tablet Take 200 mg by mouth 2 (two) times daily.    Marland Kitchen estradiol (VIVELLE-DOT) 0.05 MG/24HR patch Place 1 patch (0.05 mg total) onto the skin 2 (two) times a week. 24 patch 4  . gabapentin (NEURONTIN) 300 MG capsule Take 1 capsule by mouth 2 (two) times daily.   6  . TECFIDERA 240 MG CPDR TAKE ONE CAPSULE (240 MG) BY MOUTH TWO TIMES DAILY. STORE AT ROOM TEMPIN THE ORIGINAL CONTAINER. DO NOT CRUSH OR CHEW. SWALLOW WHOLE. 60 capsule 0  . valACYclovir (VALTREX) 500 MG tablet Take 500 mg by mouth as directed.    . vitamin B-12 (CYANOCOBALAMIN) 1000 MCG tablet Take 1 tablet by mouth daily.    Marland Kitchen omeprazole (PRILOSEC) 40 MG capsule Take 1 capsule (40 mg total) by mouth daily before breakfast. 30 capsule 11  . ranitidine (ZANTAC) 150 MG tablet Take 1 tablet (150 mg total) by mouth daily. 30 tablet 0   No current facility-administered medications on file prior to visit.     ALLERGIES: Allergies  Allergen Reactions  . Prednisone Other (See Comments)    Causes extreme hypotension  . Morphine Other (See Comments)  . Oxycodone Other (See Comments) and Nausea And Vomiting    FAMILY HISTORY: Family History  Problem Relation Age of Onset  . Heart disease Father   . Hypertension Mother   . Cancer Mother     SKIN  . Diabetes Mother   . Alzheimer's disease Maternal Grandmother   . Diabetes Maternal Grandmother   . Cancer Maternal Uncle     MELANOMA  . Colon cancer Neg Hx     SOCIAL HISTORY: Social History   Social History  . Marital status: Married    Spouse name: Cletus Gash   . Number of children: 3  . Years of education: 12   Occupational History  . housewife    Social History Main Topics  . Smoking status: Former Smoker    Quit date: 09/08/1994  . Smokeless tobacco:  Never Used     Comment: Quit 09/2013  . Alcohol use 3.0 oz/week    5 Glasses of wine per week  . Drug use: No  . Sexual activity: Yes     Comment: intercoiurse age 69, sexuall partners les than 5   Other Topics Concern  . Not on file   Social History Narrative   ** Merged History Encounter **       ** Data from: 05/25/12 Enc Dept: [facility]       ** Data from: 07/20/13 Enc Dept: [facility]   Patient is married Cletus Gash).   Patient has 3 children.    Patient works at [employer] in [place].   Patient has high  school education.   Caffeine consumption two cups daily        REVIEW OF SYSTEMS: Constitutional: No fevers, chills, or sweats, no generalized fatigue, change in appetite Eyes: No visual changes, double vision, eye pain Ear, nose and throat: No hearing loss, ear pain, nasal congestion, sore throat Cardiovascular: No chest pain, palpitations Respiratory:  No shortness of breath at rest or with exertion, wheezes GastrointestinaI: acid reflux Genitourinary:  No dysuria, urinary retention or frequency Musculoskeletal:  No neck pain, back pain Integumentary: No rash, pruritus, skin lesions Neurological: as above Psychiatric: No depression, insomnia, anxiety Endocrine: No palpitations, fatigue, diaphoresis, mood swings, change in appetite, change in weight, increased thirst Hematologic/Lymphatic:  No purpura, petechiae. Allergic/Immunologic: no itchy/runny eyes, nasal congestion, recent allergic reactions, rashes  PHYSICAL EXAM: Vitals:   08/13/16 1502  BP: 130/76  Pulse: 86   General: No acute distress.  Patient appears well-groomed.  Head:  Normocephalic/atraumatic Eyes:  fundi examined but not visualized Neck: supple, no paraspinal tenderness, full range of motion Back: No paraspinal tenderness Heart: regular rate and rhythm Lungs: Clear to auscultation bilaterally. Vascular: No carotid bruits. Neurological Exam: Mental status: alert and oriented to  person, place, and time, recent and remote memory intact, fund of knowledge intact, attention and concentration intact, speech fluent and not dysarthric, language intact. Cranial nerves: CN I: not tested CN II: pupils equal, round and reactive to light, visual fields intact CN III, IV, VI:  full range of motion, no nystagmus, no ptosis CN V: altered right V2-V3  CN VII: upper and lower face symmetric CN VIII: hearing intact CN IX, X: gag intact, uvula midline CN XI: sternocleidomastoid and trapezius muscles intact CN XII: tongue midline Bulk & Tone: normal, no fasciculations. Motor:  5/5 throughout Sensation:  Decreased pinprick sensation in arms bilaterally (right worse than left) and toes.  Decreased vibration sensation in right foot. Deep Tendon Reflexes:  + throughout, toes downgoing. Finger to nose testing:  Without dysmetria.  Heel to shin:  Without dysmetria.  Gait:  Normal station and stride.  Able to turn and tandem walk. Timed 25 foot walk 5.60 seconds.  Romberg negative.  IMPRESSION: Known diagnosis of relapsing remitting MS.  Although initial brainstem lesion has resolved with all subsequent MRIs unremarkable, she has a clinical history of two flare-ups, as well as positive CSF for oligoclonal bands and abnormal neurologic findings on physical exam.   PLAN: 1.  After discussion, since she has such discomfort due to the reflux, we will switch from Tecfidera to [place].  She will undergo appropriate protocol (eye exam, EKG, etc).  She will continue Tecfidera in the meantime. 2.  Recheck CBC with diff, LFTs and D 3.  Continue gabapentin. 4.  She will follow up 3 months after initiation of Gilenya.  ADDENDUM (09/11/16):  Pre-testing for Gilenya: 09/06/16 LABS: CBC with WBC 5, HGB 14.1, HCT 42.9, PLT 318, ALC1.4; CMP with Na 141, K 4.9, BUN 19, Cr 0.85, total 0.6, ALP 99, AST 19, ALT 19; Varicella Zoster antibody positive (3446) 09/06/16 EKG: NSR 79 bpm, normal 09/09/16  Ophthalmologic exam:  No macular edema  Thank you for allowing me to take part in the care of this patient.  Metta Clines, DO  CC:  Charlotte Sanes, MD

## 2016-08-13 NOTE — Patient Instructions (Signed)
1.  Continue Tecfidera for now and we will get you set up for Gilenya. 2.  Repeat CBC with diff, hepatic panel, and D level 3.  Follow up 3 months after starting Gilenya.

## 2016-08-14 ENCOUNTER — Telehealth: Payer: Self-pay

## 2016-08-14 ENCOUNTER — Other Ambulatory Visit (INDEPENDENT_AMBULATORY_CARE_PROVIDER_SITE_OTHER): Payer: Federal, State, Local not specified - PPO

## 2016-08-14 DIAGNOSIS — G35 Multiple sclerosis: Secondary | ICD-10-CM

## 2016-08-14 LAB — HEPATIC FUNCTION PANEL
ALK PHOS: 87 U/L (ref 39–117)
ALT: 14 U/L (ref 0–35)
AST: 16 U/L (ref 0–37)
Albumin: 4.7 g/dL (ref 3.5–5.2)
BILIRUBIN DIRECT: 0.1 mg/dL (ref 0.0–0.3)
TOTAL PROTEIN: 7.9 g/dL (ref 6.0–8.3)
Total Bilirubin: 0.6 mg/dL (ref 0.2–1.2)

## 2016-08-14 LAB — CBC WITH DIFFERENTIAL/PLATELET
BASOS ABS: 0 10*3/uL (ref 0.0–0.1)
Basophils Relative: 0.3 % (ref 0.0–3.0)
EOS ABS: 0.2 10*3/uL (ref 0.0–0.7)
Eosinophils Relative: 2.2 % (ref 0.0–5.0)
HCT: 40.2 % (ref 36.0–46.0)
HEMOGLOBIN: 13.6 g/dL (ref 12.0–15.0)
LYMPHS PCT: 24 % (ref 12.0–46.0)
Lymphs Abs: 1.8 10*3/uL (ref 0.7–4.0)
MCHC: 33.7 g/dL (ref 30.0–36.0)
MCV: 88.9 fl (ref 78.0–100.0)
Monocytes Absolute: 0.4 10*3/uL (ref 0.1–1.0)
Monocytes Relative: 5.2 % (ref 3.0–12.0)
Neutro Abs: 5 10*3/uL (ref 1.4–7.7)
Neutrophils Relative %: 68.3 % (ref 43.0–77.0)
Platelets: 277 10*3/uL (ref 150.0–400.0)
RBC: 4.52 Mil/uL (ref 3.87–5.11)
RDW: 13.2 % (ref 11.5–15.5)
WBC: 7.4 10*3/uL (ref 4.0–10.5)

## 2016-08-14 LAB — VITAMIN D 25 HYDROXY (VIT D DEFICIENCY, FRACTURES): VITD: 38.68 ng/mL (ref 30.00–100.00)

## 2016-08-14 NOTE — Telephone Encounter (Signed)
Opened in error

## 2016-08-22 ENCOUNTER — Telehealth: Payer: Self-pay

## 2016-08-22 NOTE — Telephone Encounter (Signed)
Message relayed to patient. Verbalized understanding and denied questions.   

## 2016-08-22 NOTE — Telephone Encounter (Signed)
-----   Message from Pieter Partridge, DO sent at 08/22/2016 11:43 AM EST ----- Vitamin D level is 38, which is low-normal range.  I would like her to increase her D3 from 2000 IU to 4000 IU daily.  Recheck in 6 months.

## 2016-08-27 ENCOUNTER — Telehealth: Payer: Self-pay | Admitting: Neurology

## 2016-08-27 NOTE — Telephone Encounter (Signed)
PT called and said no one has come out to see her yet from [place] (spelling)/[facility] CB# [phone]

## 2016-09-02 ENCOUNTER — Telehealth: Payer: Self-pay | Admitting: Neurology

## 2016-09-02 DIAGNOSIS — G35 Multiple sclerosis: Secondary | ICD-10-CM

## 2016-09-02 NOTE — Telephone Encounter (Signed)
Patient needs to talk to someone about medication please call [phone]

## 2016-09-03 MED ORDER — DIMETHYL FUMARATE 120 MG PO CPDR: 240.0000 mg | DELAYED_RELEASE_CAPSULE | Freq: Every day | ORAL | 0 refills | Status: DC

## 2016-09-03 NOTE — Telephone Encounter (Signed)
Spoke with patient and she is set up for labs and EKG for process to start Crystal Simon.  She needs referral to eye doctor.  Process is taken longer than she thought and will need samples of Tecfidera to last until she starts Gilenya.  Samples at the front for pick up.  Referral made to Dr. Quentin Ore at [facility]. They will contact patient to schedule.

## 2016-09-17 ENCOUNTER — Other Ambulatory Visit: Payer: Self-pay

## 2016-09-17 ENCOUNTER — Telehealth: Payer: Self-pay | Admitting: Neurology

## 2016-09-17 ENCOUNTER — Other Ambulatory Visit: Payer: Self-pay | Admitting: Women's Health

## 2016-09-17 MED ORDER — ALPRAZOLAM 0.5 MG PO TABS: 0.5000 mg | ORAL_TABLET | Freq: Every evening | ORAL | 1 refills | Status: DC | PRN

## 2016-09-17 NOTE — Telephone Encounter (Signed)
Spoke to patient. Advised called Gilenya. Spoke w/ rep; left vmail for clinical rep. Advised patient will f/u tomorrow afternoon. Patient agreed.

## 2016-09-17 NOTE — Telephone Encounter (Signed)
PT called and said she is still waiting on a [facility] (spelling?) representative to contact her/[facility]wn CB# [phone]

## 2016-09-17 NOTE — Telephone Encounter (Signed)
Called into pharmacy

## 2016-09-17 NOTE — Telephone Encounter (Signed)
Okay for refill?  

## 2016-09-17 NOTE — Telephone Encounter (Signed)
Crystal Simon 12/25/1965. Her # [id number]. [facility] nurse called her and wanted her to find out the status from our office?  She said she was told they were waiting on [place]. She also wanted to make sure that [facility] sent information to [place]. This was done about 1 week ago. She had to listen to a thing through [place] and she said she does have an irregular heart beat. She wanted Dr. Tomi Likens aware of that. Thank you

## 2016-09-17 NOTE — Telephone Encounter (Signed)
Spoke to patient. Gave patient [place] phone number to contact to find out status. Patient will c/b.

## 2016-09-22 ENCOUNTER — Telehealth: Payer: Self-pay | Admitting: Neurology

## 2016-09-22 NOTE — Telephone Encounter (Deleted)
[name] 01/21/1966# [id number]. She is needing MS medication. Trying to get her through [place]

## 2016-09-22 NOTE — Telephone Encounter (Signed)
Crystal Simon 05/09/1966. She was calling in regards to her MS medication and getting it through [place]. She would like you to call her back at [phone]. Thank you

## 2016-09-22 NOTE — Telephone Encounter (Signed)
Spoke with patient. Advised spoke w/ Gilenya rep again and was informed waiting on the clearance from our office. Patient states she had testing (EKG) done @ [facility] and had an irregular heartbeat that she knew about in the past.  Have not received test results per Dr. Tomi Likens. Called [facility] will fax notes. Called ophthalmologist will fax records. Patient will come in for OV tomorrow. Will contact [place] @ [phone] w/ OV instructions.

## 2016-09-23 ENCOUNTER — Encounter: Payer: Self-pay | Admitting: Neurology

## 2016-09-23 ENCOUNTER — Ambulatory Visit (INDEPENDENT_AMBULATORY_CARE_PROVIDER_SITE_OTHER): Payer: Federal, State, Local not specified - PPO | Admitting: Neurology

## 2016-09-23 VITALS — BP 122/72 | HR 68 | Ht 69.0 in | Wt 206.6 lb

## 2016-09-23 DIAGNOSIS — G35 Multiple sclerosis: Secondary | ICD-10-CM

## 2016-09-23 NOTE — Patient Instructions (Addendum)
1.  We will get started scheduling for Gilenya 2.  Repeat CBC with diff and hepatic (liver panel) in 6 months 3.  Follow up in 6 months (after repeat labs).

## 2016-09-23 NOTE — Progress Notes (Signed)
NEUROLOGY FOLLOW UP OFFICE NOTE  Crystal Simon [id number]  HISTORY OF PRESENT ILLNESS: Crystal Simon is a 51 year old right-handed female who follows up to discuss management for MS.  She is accompanied by her husband.  UPDATE: Due to GERD, plan was to switch from Tecfidera to [place].   EKG from 09/06/16 demonstrated NSR 79 bpm with P/PR 108/160 ms and QT/QTc of 404/463 ms. Labs from 09/06/16 include: CBC with WBC 5.0, HGB 14.1, HCT 42.9, PLT 318, absolute lymphocyte count 1.4; CMP with Na 141, K 4.9, Cl 100, glucose 78, BUN 19, Cr 0.85, total bili 0.6, ALP 99, AST 19, ALT 19; Varicella Zoster antibody IgG 3446   She had a ophthalmologic exam on 09/09/16, which revealed mild cataract but no macular edema.  She returns because she has some questions regarding the medication.  She wanted to bring to my attention that a doctor once told her she had a small murmur.   HISTORY: Initial symptom in 2012 presented with 1 week episode of vertigo with feeling off-balance.  This was followed by intermittent numbness and tingling of her hands and feet.  In November 2014, she developed double vision associated with slurred speech, right sided numbness and difficulty swallowing (the right side of her mouth, tongue and throat felt numb).  MRI of the brain with and without contrast from 07/12/13 was personally reviewed and showed hyperintense T2 lesions in the on left side of the midbrain.  MRI of cervical spine from 08/02/13 was personally reviewed and was negative for demyelinating lesions.  She underwent a lumbar puncture in January 2015.  CSF cell count was 1, protein 33, glucose 56, 3 oligoclonal bands (not present in serum), negative myelin basic protein, IgG index 0.52.  NMO antibody was negative.   She was initially started on Tecfidera in February 2015.  She had done well on Tecfidera.  She never had any acute clinical relapses and repeat brain imaging was negative (resolution of previous brainstem  lesion).     Repeat MRI of the brain with and without contrast from 01/18/14 was reportedly normal with resolution of the previous midbrain lesion (report available but not images).  Most recent MRI of brain with and without contrast from 10/09/15 was reportedly again unremarkable and showed no significant white matter disease.  Known 6 mm pineal cyst was noted.   At baseline, she had paresthesias of the right side of her face and throat.  She also has paresthesias in the upper extremities and toes, worse on the right.  Sometimes she needs to use a cane. Heat (such as during the summer months), stress and lack of sleep exacerbate chronic symptoms.  She also has restless leg syndrome.   She takes gabapentin, D3 2000 IU daily and B12 1072mcg daily.   Even though the Tecfidera works well, she has significant GERD.  Therefore she was discussing about switching therapy with her prior neurologist back in August, considering Gilenya.  JC Virus antibody positive with index of 0.55. B12 level 282 (has since started B12) D level 43.  PAST MEDICAL HISTORY: Past Medical History:  Diagnosis Date  . Anxiety   . Bulging discs 1998   cervical  . Complication of anesthesia   . Constipation   . [name] syndrome since late teens  . Headache(784.0)   . Heart palpitations   . Hypertension   . MS (multiple sclerosis) ([place])   . PONV (postoperative nausea and vomiting)     MEDICATIONS: Current Outpatient Prescriptions on  File Prior to Visit  Medication Sig Dispense Refill  . acetaminophen (TYLENOL) 325 MG tablet Take 650 mg by mouth every 6 (six) hours as needed.    . ALPRAZolam (XANAX) 0.5 MG tablet Take 1 tablet (0.5 mg total) by mouth at bedtime as needed for anxiety. 30 tablet 1  . Ascorbic Acid (VITAMIN C) 1000 MG tablet Take 1,000 mg by mouth daily. Reported on 12/06/2015    . aspirin 81 MG tablet Take 81 mg by mouth daily.    . Cholecalciferol (VITAMIN D-3 PO) Take by mouth.    . cimetidine  (TAGAMET) 200 MG tablet Take 200 mg by mouth 2 (two) times daily.    . Dimethyl Fumarate (TECFIDERA) 120 MG CPDR Take 240 mg by mouth daily. 56 capsule 0  . estradiol (VIVELLE-DOT) 0.05 MG/24HR patch Place 1 patch (0.05 mg total) onto the skin 2 (two) times a week. 24 patch 4  . gabapentin (NEURONTIN) 300 MG capsule Take 1 capsule by mouth 2 (two) times daily. 300mg  in a.m; 600mg  in p.m  6  . Garlic 123XX123 MG CAPS Take by mouth 2 (two) times daily.     . vitamin B-12 (CYANOCOBALAMIN) 1000 MCG tablet Take 1 tablet by mouth daily.    Marland Kitchen omeprazole (PRILOSEC) 40 MG capsule Take 1 capsule (40 mg total) by mouth daily before breakfast. 30 capsule 11  . ranitidine (ZANTAC) 150 MG tablet Take 1 tablet (150 mg total) by mouth daily. 30 tablet 0  . valACYclovir (VALTREX) 500 MG tablet Take 500 mg by mouth as directed.     No current facility-administered medications on file prior to visit.     ALLERGIES: Allergies  Allergen Reactions  . Prednisone Other (See Comments)    Causes extreme hypotension  . Morphine Other (See Comments)  . Oxycodone Other (See Comments) and Nausea And Vomiting    FAMILY HISTORY: Family History  Problem Relation Age of Onset  . Heart disease Father   . Hypertension Mother   . Cancer Mother     SKIN  . Diabetes Mother   . Cancer Maternal Uncle     MELANOMA  . Alzheimer's disease Maternal Grandmother   . Diabetes Maternal Grandmother   . Colon cancer Neg Hx     SOCIAL HISTORY: Social History   Social History  . Marital status: Married    Spouse name: Crystal Simon   . Number of children: 3  . Years of education: 12   Occupational History  . housewife    Social History Main Topics  . Smoking status: Former Smoker    Quit date: 09/08/1994  . Smokeless tobacco: Never Used     Comment: Quit 09/2013  . Alcohol use 3.0 oz/week    5 Glasses of wine per week  . Drug use: No  . Sexual activity: Yes     Comment: intercoiurse age 51, sexuall partners les than 5    Other Topics Concern  . Not on file   Social History Narrative   ** Merged History Encounter **       ** Data from: 05/25/12 Enc Dept: [facility]       ** Data from: 07/20/13 Enc Dept: [facility]   Patient is married Crystal Simon).   Patient has 3 children.    Patient works at [employer] in [place].   Patient has high school education.   Caffeine consumption two cups daily        REVIEW OF SYSTEMS: Constitutional: No fevers, chills, or sweats, no  generalized fatigue, change in appetite Eyes: No visual changes, double vision, eye pain Ear, nose and throat: No hearing loss, ear pain, nasal congestion, sore throat Cardiovascular: No chest pain, palpitations Respiratory:  No shortness of breath at rest or with exertion, wheezes GastrointestinaI: No nausea, vomiting, diarrhea, abdominal pain, fecal incontinence Genitourinary:  No dysuria, urinary retention or frequency Musculoskeletal:  No neck pain, back pain Integumentary: No rash, pruritus, skin lesions Neurological: as above Psychiatric: No depression, insomnia, anxiety Endocrine: No palpitations, fatigue, diaphoresis, mood swings, change in appetite, change in weight, increased thirst Hematologic/Lymphatic:  No purpura, petechiae. Allergic/Immunologic: no itchy/runny eyes, nasal congestion, recent allergic reactions, rashes  PHYSICAL EXAM: Vitals:   09/23/16 1047  BP: 122/72  Pulse: 68   General: No acute distress.  Patient appears well-groomed.  normal body habitus. Head:  Normocephalic/atraumatic Neurologic exam deferred.  IMPRESSION: Multiple sclerosis  I think Gilenya is appropriate.  I don't appreciate a murmur.  She does not exhibit an arrhythmia, bradycardia and her recent EKG was normal.  PLAN: 1.  We will proceed with Gilenya.  She will set up for first dose under cardiology supervision 2.  Repeat CBC with diff and hepatic panel in 6 months. 3.  She will repeat ophthalmology exam in 3 months. 4.  She  will continue D3 4000 IU daily and B12 1000 mcg daily 5.  Follow up in 6 months after repeat labs.  16 minutes spent face to face with patient, 100% spent discussing if medication is appropriate treatment for her.  I answered all questions to the best of my ability.  Metta Clines, DO  CC:  Charlotte Sanes, MD

## 2016-10-06 ENCOUNTER — Other Ambulatory Visit: Payer: Self-pay | Admitting: Neurology

## 2016-10-06 NOTE — Telephone Encounter (Signed)
Larey Seat - this looks like a new start. Where is she in the process?

## 2016-10-06 NOTE — Telephone Encounter (Signed)
Crystal Simon 07/18/1966. Her # [id number]. She needs the Gilenya called in for her. She uses [facility] She asked if you could please let her know once called in. Thank you

## 2016-10-07 ENCOUNTER — Telehealth: Payer: Self-pay | Admitting: Neurology

## 2016-10-07 MED ORDER — FINGOLIMOD HCL 0.5 MG PO CAPS: 1.0000 | ORAL_CAPSULE | Freq: Every day | ORAL | 0 refills | Status: DC

## 2016-10-07 NOTE — Telephone Encounter (Signed)
Crystal Simon 01/10/1966. She would like for you to leave samples for her. Thank you

## 2016-10-07 NOTE — Telephone Encounter (Signed)
Spoke to patient. Advised Gilenya approved. Will call [facility] @ [phone] to check order status.  Patient verbalized understanding.

## 2016-10-07 NOTE — Telephone Encounter (Signed)
Check off the startup prescription

## 2016-10-08 NOTE — Telephone Encounter (Signed)
Done

## 2016-10-08 NOTE — Telephone Encounter (Signed)
Gilenya will be delivered to patient tomorrow (10/09/16).  Called patient and confirmed.

## 2016-10-13 ENCOUNTER — Telehealth: Payer: Self-pay

## 2016-10-13 NOTE — Telephone Encounter (Signed)
Received [facility] fax: On 02/03 patient thinks she had an allergic reaction to Gilenya. Reports: Eyes are swollen, mouth feels tingly on the inside. Eyes feel heavy, tired for about 3 days before the swelling. Patient was advised by RN: To treat @ home. Gave directions on how to treat @ home. Pharmacist told patient to take Benadryl. Spoke to patient today: Eyes are not as swollen. Patient states she thinks she is "Ok". Advised will let Dr. Tomi Likens know. Advised to contact [place] line also.  Pt verbalized understanding.

## 2016-10-14 NOTE — Telephone Encounter (Signed)
If she is getting better, I would continue the Gilenya and monitor, but I agree that she should still contact the [facility].

## 2016-10-14 NOTE — Telephone Encounter (Signed)
Patient aware.

## 2016-12-10 ENCOUNTER — Other Ambulatory Visit: Payer: Self-pay | Admitting: *Deleted

## 2016-12-10 NOTE — Telephone Encounter (Signed)
Pt has annual on 12/19/16, just received refill from pharmacy today for xanax 0.5 mg, will deny Rx and pt can get new Rx when she comes in for annual exam on 12/19/16

## 2016-12-15 ENCOUNTER — Ambulatory Visit: Payer: Federal, State, Local not specified - PPO | Admitting: Neurology

## 2016-12-18 ENCOUNTER — Other Ambulatory Visit: Payer: Self-pay | Admitting: Neurology

## 2016-12-19 ENCOUNTER — Encounter: Payer: Federal, State, Local not specified - PPO | Admitting: Gynecology

## 2016-12-24 ENCOUNTER — Encounter: Payer: Federal, State, Local not specified - PPO | Admitting: Gynecology

## 2016-12-25 ENCOUNTER — Encounter: Payer: Self-pay | Admitting: Gynecology

## 2016-12-25 ENCOUNTER — Ambulatory Visit (INDEPENDENT_AMBULATORY_CARE_PROVIDER_SITE_OTHER): Payer: Federal, State, Local not specified - PPO | Admitting: Gynecology

## 2016-12-25 VITALS — BP 114/78 | Ht 69.0 in | Wt 203.0 lb

## 2016-12-25 DIAGNOSIS — Z01419 Encounter for gynecological examination (general) (routine) without abnormal findings: Secondary | ICD-10-CM | POA: Diagnosis not present

## 2016-12-25 DIAGNOSIS — Z13220 Encounter for screening for lipoid disorders: Secondary | ICD-10-CM

## 2016-12-25 LAB — COMPREHENSIVE METABOLIC PANEL
ALBUMIN: 4.3 g/dL (ref 3.6–5.1)
ALK PHOS: 83 U/L (ref 33–130)
ALT: 15 U/L (ref 6–29)
AST: 13 U/L (ref 10–35)
BILIRUBIN TOTAL: 1.1 mg/dL (ref 0.2–1.2)
BUN: 18 mg/dL (ref 7–25)
CALCIUM: 9.7 mg/dL (ref 8.6–10.4)
CO2: 23 mmol/L (ref 20–31)
CREATININE: 0.91 mg/dL (ref 0.50–1.05)
Chloride: 108 mmol/L (ref 98–110)
Glucose, Bld: 56 mg/dL — ABNORMAL LOW (ref 65–99)
Potassium: 4.5 mmol/L (ref 3.5–5.3)
SODIUM: 141 mmol/L (ref 135–146)
TOTAL PROTEIN: 6.9 g/dL (ref 6.1–8.1)

## 2016-12-25 LAB — CBC WITH DIFFERENTIAL/PLATELET
BASOS PCT: 1 %
Basophils Absolute: 42 cells/uL (ref 0–200)
EOS PCT: 1 %
Eosinophils Absolute: 42 cells/uL (ref 15–500)
HEMATOCRIT: 41.8 % (ref 35.0–45.0)
HEMOGLOBIN: 14 g/dL (ref 11.7–15.5)
Lymphocytes Relative: 5 %
Lymphs Abs: 210 cells/uL — ABNORMAL LOW (ref 850–3900)
MCH: 30.4 pg (ref 27.0–33.0)
MCHC: 33.5 g/dL (ref 32.0–36.0)
MCV: 90.7 fL (ref 80.0–100.0)
MONO ABS: 672 {cells}/uL (ref 200–950)
MPV: 10 fL (ref 7.5–12.5)
Monocytes Relative: 16 %
NEUTROS ABS: 3234 {cells}/uL (ref 1500–7800)
Neutrophils Relative %: 77 %
Platelets: 296 10*3/uL (ref 140–400)
RBC: 4.61 MIL/uL (ref 3.80–5.10)
RDW: 13.6 % (ref 11.0–15.0)
WBC: 4.2 10*3/uL (ref 3.8–10.8)

## 2016-12-25 NOTE — Progress Notes (Signed)
    Crystal Simon 14-Mar-1966 [id number]        51 y.o.  G3P3 for annual exam.   Past medical history,surgical history, problem list, medications, allergies, family history and social history were all reviewed and documented as reviewed in the EPIC chart.  ROS:  Performed with pertinent positives and negatives included in the history, assessment and plan.   Additional significant findings :  None   Exam: Caryn Bee assistant Vitals:   12/25/16 0941  BP: 114/78  Weight: 203 lb (92.1 kg)  Height: 5\' 9"  (1.753 m)   Body mass index is 29.98 kg/m.  General appearance:  Normal affect, orientation and appearance. Skin: Grossly normal HEENT: Without gross lesions.  No cervical or supraclavicular adenopathy. Thyroid normal.  Lungs:  Clear without wheezing, rales or rhonchi Cardiac: RR, without RMG Abdominal:  Soft, nontender, without masses, guarding, rebound, organomegaly or hernia Breasts:  Examined lying and sitting without masses, retractions, discharge or axillary adenopathy. Pelvic:  Ext, BUS, Vagina: Normal with mild atrophic changes. Pap smear cuff done  Adnexa: Without masses or tenderness    Anus and perineum: Normal   Rectovaginal: Normal sphincter tone without palpated masses or tenderness.    Assessment/Plan:  51 y.o. G3P3 female for annual exam.   1. Status post TVH for endometriosis in the past. Had been on Vivelle but discontinued this past year and is doing well without significant hot flushes, sweats or vaginal dryness. Will continue off HRT. Will follow up if any issues develop. 2. Pap smear 2013. Pap smear done today. No history of significant abnormal Pap smears. Is on an in immunosuppressant for her MS.  Will plan on continuing Pap smear surveillance. 3. Mammography coming due in June and I reminded her to schedule this. SBE monthly reviewed. 4. Colonoscopy 2016. Repeat at their recommended interval. 5. Health maintenance. Baseline future orders placed for CBC,  CMP, lipid profile. TSH normal last year. Patient will return fasting to have these done. Follow up in one year, sooner as needed.   Anastasio Auerbach MD, 10:06 AM 12/25/2016

## 2016-12-25 NOTE — Addendum Note (Signed)
Addended by: Nelva Nay on: 12/25/2016 10:33 AM   Modules accepted: Orders

## 2016-12-25 NOTE — Addendum Note (Signed)
Addended by: Joaquin Music on: 12/25/2016 10:11 AM   Modules accepted: Orders

## 2016-12-25 NOTE — Patient Instructions (Signed)
Follow up for fasting blood work.  You may obtain a copy of any labs that were done today by logging onto MyChart as outlined in the instructions provided with your AVS (after visit summary). The office will not call with normal lab results but certainly if there are any significant abnormalities then we will contact you.   Health Maintenance Adopting a healthy lifestyle and getting preventive care can go a long way to promote health and wellness. Talk with your health care provider about what schedule of regular examinations is right for you. This is a good chance for you to check in with your provider about disease prevention and staying healthy. In between checkups, there are plenty of things you can do on your own. Experts have done a lot of research about which lifestyle changes and preventive measures are most likely to keep you healthy. Ask your health care provider for more information. WEIGHT AND DIET  Eat a healthy diet  Be sure to include plenty of vegetables, fruits, low-fat dairy products, and lean protein.  Do not eat a lot of foods high in solid fats, added sugars, or salt.  Get regular exercise. This is one of the most important things you can do for your health.  Most adults should exercise for at least 150 minutes each week. The exercise should increase your heart rate and make you sweat (moderate-intensity exercise).  Most adults should also do strengthening exercises at least twice a week. This is in addition to the moderate-intensity exercise.  Maintain a healthy weight  Body mass index (BMI) is a measurement that can be used to identify possible weight problems. It estimates body fat based on height and weight. Your health care provider can help determine your BMI and help you achieve or maintain a healthy weight.  For females 20 years of age and older:   A BMI below 18.5 is considered underweight.  A BMI of 18.5 to 24.9 is normal.  A BMI of 25 to 29.9 is  considered overweight.  A BMI of 30 and above is considered obese.  Watch levels of cholesterol and blood lipids  You should start having your blood tested for lipids and cholesterol at 52 years of age, then have this test every 5 years.  You may need to have your cholesterol levels checked more often if:  Your lipid or cholesterol levels are high.  You are older than 52 years of age.  You are at high risk for heart disease.  CANCER SCREENING   Lung Cancer  Lung cancer screening is recommended for adults 55-80 years old who are at high risk for lung cancer because of a history of smoking.  A yearly low-dose CT scan of the lungs is recommended for people who:  Currently smoke.  Have quit within the past 15 years.  Have at least a 30-pack-year history of smoking. A pack year is smoking an average of one pack of cigarettes a day for 1 year.  Yearly screening should continue until it has been 15 years since you quit.  Yearly screening should stop if you develop a health problem that would prevent you from having lung cancer treatment.  Breast Cancer  Practice breast self-awareness. This means understanding how your breasts normally appear and feel.  It also means doing regular breast self-exams. Let your health care provider know about any changes, no matter how small.  If you are in your 20s or 30s, you should have a clinical breast exam (CBE)   by a health care provider every 1-3 years as part of a regular health exam.  If you are 22 or older, have a CBE every year. Also consider having a breast X-ray (mammogram) every year.  If you have a family history of breast cancer, talk to your health care provider about genetic screening.  If you are at high risk for breast cancer, talk to your health care provider about having an MRI and a mammogram every year.  Breast cancer gene (BRCA) assessment is recommended for women who have family members with BRCA-related cancers.  BRCA-related cancers include:  Breast.  Ovarian.  Tubal.  Peritoneal cancers.  Results of the assessment will determine the need for genetic counseling and BRCA1 and BRCA2 testing. Cervical Cancer Routine pelvic examinations to screen for cervical cancer are no longer recommended for nonpregnant women who are considered low risk for cancer of the pelvic organs (ovaries, uterus, and vagina) and who do not have symptoms. A pelvic examination may be necessary if you have symptoms including those associated with pelvic infections. Ask your health care provider if a screening pelvic exam is right for you.   The Pap test is the screening test for cervical cancer for women who are considered at risk.  If you had a hysterectomy for a problem that was not cancer or a condition that could lead to cancer, then you no longer need Pap tests.  If you are older than 65 years, and you have had normal Pap tests for the past 10 years, you no longer need to have Pap tests.  If you have had past treatment for cervical cancer or a condition that could lead to cancer, you need Pap tests and screening for cancer for at least 20 years after your treatment.  If you no longer get a Pap test, assess your risk factors if they change (such as having a new sexual partner). This can affect whether you should start being screened again.  Some women have medical problems that increase their chance of getting cervical cancer. If this is the case for you, your health care provider may recommend more frequent screening and Pap tests.  The human papillomavirus (HPV) test is another test that may be used for cervical cancer screening. The HPV test looks for the virus that can cause cell changes in the cervix. The cells collected during the Pap test can be tested for HPV.  The HPV test can be used to screen women 39 years of age and older. Getting tested for HPV can extend the interval between normal Pap tests from three to  five years.  An HPV test also should be used to screen women of any age who have unclear Pap test results.  After 52 years of age, women should have HPV testing as often as Pap tests.  Colorectal Cancer  This type of cancer can be detected and often prevented.  Routine colorectal cancer screening usually begins at 52 years of age and continues through 52 years of age.  Your health care provider may recommend screening at an earlier age if you have risk factors for colon cancer.  Your health care provider may also recommend using home test kits to check for hidden blood in the stool.  A small camera at the end of a tube can be used to examine your colon directly (sigmoidoscopy or colonoscopy). This is done to check for the earliest forms of colorectal cancer.  Routine screening usually begins at age 58.  Direct  examination of the colon should be repeated every 5-10 years through 52 years of age. However, you may need to be screened more often if early forms of precancerous polyps or small growths are found. Skin Cancer  Check your skin from head to toe regularly.  Tell your health care provider about any new moles or changes in moles, especially if there is a change in a mole's shape or color.  Also tell your health care provider if you have a mole that is larger than the size of a pencil eraser.  Always use sunscreen. Apply sunscreen liberally and repeatedly throughout the day.  Protect yourself by wearing long sleeves, pants, a wide-brimmed hat, and sunglasses whenever you are outside. HEART DISEASE, DIABETES, AND HIGH BLOOD PRESSURE   Have your blood pressure checked at least every 1-2 years. High blood pressure causes heart disease and increases the risk of stroke.  If you are between 26 years and 34 years old, ask your health care provider if you should take aspirin to prevent strokes.  Have regular diabetes screenings. This involves taking a blood sample to check your  fasting blood sugar level.  If you are at a normal weight and have a low risk for diabetes, have this test once every three years after 52 years of age.  If you are overweight and have a high risk for diabetes, consider being tested at a younger age or more often. PREVENTING INFECTION  Hepatitis B  If you have a higher risk for hepatitis B, you should be screened for this virus. You are considered at high risk for hepatitis B if:  You were born in a country where hepatitis B is common. Ask your health care provider which countries are considered high risk.  Your parents were born in a high-risk country, and you have not been immunized against hepatitis B (hepatitis B vaccine).  You have HIV or AIDS.  You use needles to inject street drugs.  You live with someone who has hepatitis B.  You have had sex with someone who has hepatitis B.  You get hemodialysis treatment.  You take certain medicines for conditions, including cancer, organ transplantation, and autoimmune conditions. Hepatitis C  Blood testing is recommended for:  Everyone born from [birth year] through 1966.  Anyone with known risk factors for hepatitis C. Sexually transmitted infections (STIs)  You should be screened for sexually transmitted infections (STIs) including gonorrhea and chlamydia if:  You are sexually active and are younger than 52 years of age.  You are older than 52 years of age and your health care provider tells you that you are at risk for this type of infection.  Your sexual activity has changed since you were last screened and you are at an increased risk for chlamydia or gonorrhea. Ask your health care provider if you are at risk.  If you do not have HIV, but are at risk, it may be recommended that you take a prescription medicine daily to prevent HIV infection. This is called pre-exposure prophylaxis (PrEP). You are considered at risk if:  You are sexually active and do not regularly use condoms or  know the HIV status of your partner(s).  You take drugs by injection.  You are sexually active with a partner who has HIV. Talk with your health care provider about whether you are at high risk of being infected with HIV. If you choose to begin PrEP, you should first be tested for HIV. You should then be tested  every 3 months for as long as you are taking PrEP.  PREGNANCY   If you are premenopausal and you may become pregnant, ask your health care provider about preconception counseling.  If you may become pregnant, take 400 to 800 micrograms (mcg) of folic acid every day.  If you want to prevent pregnancy, talk to your health care provider about birth control (contraception). OSTEOPOROSIS AND MENOPAUSE   Osteoporosis is a disease in which the bones lose minerals and strength with aging. This can result in serious bone fractures. Your risk for osteoporosis can be identified using a bone density scan.  If you are 67 years of age or older, or if you are at risk for osteoporosis and fractures, ask your health care provider if you should be screened.  Ask your health care provider whether you should take a calcium or vitamin D supplement to lower your risk for osteoporosis.  Menopause may have certain physical symptoms and risks.  Hormone replacement therapy may reduce some of these symptoms and risks. Talk to your health care provider about whether hormone replacement therapy is right for you.  HOME CARE INSTRUCTIONS   Schedule regular health, dental, and eye exams.  Stay current with your immunizations.   Do not use any tobacco products including cigarettes, chewing tobacco, or electronic cigarettes.  If you are pregnant, do not drink alcohol.  If you are breastfeeding, limit how much and how often you drink alcohol.  Limit alcohol intake to no more than 1 drink per day for nonpregnant women. One drink equals 12 ounces of beer, 5 ounces of wine, or 1 ounces of hard liquor.  Do  not use street drugs.  Do not share needles.  Ask your health care provider for help if you need support or information about quitting drugs.  Tell your health care provider if you often feel depressed.  Tell your health care provider if you have ever been abused or do not feel safe at home. Document Released: 03/10/2011 Document Revised: 01/09/2014 Document Reviewed: 07/27/2013 [facility] Patient Information 2015 [place], [place]. This information is not intended to replace advice given to you by your health care provider. Make sure you discuss any questions you have with your health care provider.

## 2016-12-26 LAB — PAP IG W/ RFLX HPV ASCU

## 2016-12-31 ENCOUNTER — Telehealth: Payer: Self-pay | Admitting: Neurology

## 2016-12-31 ENCOUNTER — Other Ambulatory Visit: Payer: Federal, State, Local not specified - PPO

## 2016-12-31 DIAGNOSIS — E559 Vitamin D deficiency, unspecified: Secondary | ICD-10-CM

## 2016-12-31 DIAGNOSIS — D729 Disorder of white blood cells, unspecified: Secondary | ICD-10-CM

## 2016-12-31 DIAGNOSIS — G35 Multiple sclerosis: Secondary | ICD-10-CM

## 2016-12-31 NOTE — Telephone Encounter (Signed)
Patient was seen by her Obgyn today for her yearly physical. She had her blood taken and her white blood count was low. She wanted Dr. Tomi Likens to check her results. This is her first labs since she has been on the [place]. Thanks

## 2016-12-31 NOTE — Telephone Encounter (Signed)
It is expected for the white count to drop.  We just have to monitor that it doesn't drop too much.  I would like to repeat CBC with diff (as well as LFTs) in July (prior to her follow up).

## 2017-01-01 NOTE — Telephone Encounter (Signed)
Clld pt  - advsd of provider's notations. Pt stated she understood. Pt stated she would like to go ahead and have Hepatic Function Panel and Vit D levels checked in addition to the CBC w/diff.   I advsd I would put future lab  orders in for her to have her blood work in July before her follow up appt. Also, advsd pt ot make sure she comes to our office first before going to have her blood work done so her name can be added to the lab list. Pt stated she would.

## 2017-01-19 ENCOUNTER — Other Ambulatory Visit: Payer: Self-pay | Admitting: *Deleted

## 2017-01-19 MED ORDER — ALPRAZOLAM 0.5 MG PO TABS: 0.5000 mg | ORAL_TABLET | Freq: Every evening | ORAL | 1 refills | Status: DC | PRN

## 2017-01-19 NOTE — Telephone Encounter (Signed)
Last filled on 09/17/16 with 1 refill.

## 2017-01-19 NOTE — Telephone Encounter (Signed)
Xanax 0.5 mg called into [facility] in [place].

## 2017-01-19 NOTE — Addendum Note (Signed)
Addended by: Thamas Jaegers on: 01/19/2017 11:02 AM   Modules accepted: Orders

## 2017-01-19 NOTE — Telephone Encounter (Signed)
Rx called in 

## 2017-01-19 NOTE — Telephone Encounter (Signed)
Ok for refill? 

## 2017-01-21 ENCOUNTER — Encounter: Payer: Self-pay | Admitting: Gynecology

## 2017-03-18 ENCOUNTER — Other Ambulatory Visit: Payer: Federal, State, Local not specified - PPO

## 2017-03-18 DIAGNOSIS — E559 Vitamin D deficiency, unspecified: Secondary | ICD-10-CM

## 2017-03-18 DIAGNOSIS — G35 Multiple sclerosis: Secondary | ICD-10-CM

## 2017-03-18 DIAGNOSIS — D729 Disorder of white blood cells, unspecified: Secondary | ICD-10-CM

## 2017-03-18 LAB — CBC WITH DIFFERENTIAL/PLATELET
BASOS ABS: 0 10*3/uL (ref 0.0–0.1)
Basophils Relative: 0.5 % (ref 0.0–3.0)
Eosinophils Absolute: 0.1 10*3/uL (ref 0.0–0.7)
Eosinophils Relative: 1.6 % (ref 0.0–5.0)
HCT: 40.9 % (ref 36.0–46.0)
Hemoglobin: 14.1 g/dL (ref 12.0–15.0)
LYMPHS PCT: 6 % — AB (ref 12.0–46.0)
Lymphs Abs: 0.3 10*3/uL — ABNORMAL LOW (ref 0.7–4.0)
MCHC: 34.6 g/dL (ref 30.0–36.0)
MCV: 89.8 fl (ref 78.0–100.0)
MONOS PCT: 10.7 % (ref 3.0–12.0)
Monocytes Absolute: 0.5 10*3/uL (ref 0.1–1.0)
Neutro Abs: 3.5 10*3/uL (ref 1.4–7.7)
Neutrophils Relative %: 81.2 % — ABNORMAL HIGH (ref 43.0–77.0)
Platelets: 282 10*3/uL (ref 150.0–400.0)
RBC: 4.55 Mil/uL (ref 3.87–5.11)
RDW: 12.8 % (ref 11.5–15.5)
WBC: 4.3 10*3/uL (ref 4.0–10.5)

## 2017-03-18 LAB — HEPATIC FUNCTION PANEL
ALBUMIN: 4.5 g/dL (ref 3.5–5.2)
ALK PHOS: 80 U/L (ref 39–117)
ALT: 21 U/L (ref 0–35)
AST: 16 U/L (ref 0–37)
Bilirubin, Direct: 0.1 mg/dL (ref 0.0–0.3)
TOTAL PROTEIN: 7.5 g/dL (ref 6.0–8.3)
Total Bilirubin: 0.8 mg/dL (ref 0.2–1.2)

## 2017-03-18 LAB — VITAMIN D 25 HYDROXY (VIT D DEFICIENCY, FRACTURES): VITD: 46.51 ng/mL (ref 30.00–100.00)

## 2017-03-19 ENCOUNTER — Other Ambulatory Visit: Payer: Self-pay | Admitting: Neurology

## 2017-03-23 ENCOUNTER — Encounter: Payer: Self-pay | Admitting: Neurology

## 2017-03-23 ENCOUNTER — Ambulatory Visit (INDEPENDENT_AMBULATORY_CARE_PROVIDER_SITE_OTHER): Payer: Federal, State, Local not specified - PPO | Admitting: Neurology

## 2017-03-23 VITALS — BP 110/70 | HR 82 | Ht 69.0 in | Wt 203.3 lb

## 2017-03-23 DIAGNOSIS — F329 Major depressive disorder, single episode, unspecified: Secondary | ICD-10-CM | POA: Diagnosis not present

## 2017-03-23 DIAGNOSIS — G35 Multiple sclerosis: Secondary | ICD-10-CM

## 2017-03-23 DIAGNOSIS — F17200 Nicotine dependence, unspecified, uncomplicated: Secondary | ICD-10-CM

## 2017-03-23 DIAGNOSIS — F32A Depression, unspecified: Secondary | ICD-10-CM

## 2017-03-23 MED ORDER — ESCITALOPRAM OXALATE 5 MG PO TABS: 5.0000 mg | ORAL_TABLET | Freq: Every day | ORAL | 0 refills | Status: DC

## 2017-03-23 NOTE — Progress Notes (Addendum)
NEUROLOGY FOLLOW UP OFFICE NOTE  Crystal Simon [id number]  HISTORY OF PRESENT ILLNESS: Crystal Simon is a 51 year old right-handed female who follows up to discuss management for MS.  She is accompanied by her husband.   UPDATE: She is taking Gilenya.  She is also taking gabapentin 600mg  at bedtime, D3 5000 IU daily and B12 1044mcg daily.  Repeat ophthalmologic exam by Dr. Kathlen Mody on 02/06/17 revealed no macular edema. Labs from 03/18/17 include:  CBC with WBC 4.3, HGB 14.1, HCT 40.9, PLT 282, ALC 0.3; hepatic panel with total bili 0.8, ALP 80, AST 16 and ALT 21; D 25 hydroxy 46.51.  She denies recent relapse.  She notes increased daytime fatigue.  She sleeps well.  She discontinued morning dose of gabapentin, which didn't help.  She does report some depression and change in mood.  She started to smoke again.   HISTORY: Initial symptom in 2012 presented with 1 week episode of vertigo with feeling off-balance.  This was followed by intermittent numbness and tingling of her hands and feet.  In November 2014, she developed double vision associated with slurred speech, right sided numbness and difficulty swallowing (the right side of her mouth, tongue and throat felt numb).  MRI of the brain with and without contrast from 07/12/13 was personally reviewed and showed hyperintense T2 lesions in the on left side of the midbrain.  MRI of cervical spine from 08/02/13 was personally reviewed and was negative for demyelinating lesions.  She underwent a lumbar puncture in January 2015.  CSF cell count was 1, protein 33, glucose 56, 3 oligoclonal bands (not present in serum), negative myelin basic protein, IgG index 0.52.  NMO antibody was negative.  Past disease modifying medication:  Tecfidera (effective but caused significant GERD).   Repeat MRI of the brain with and without contrast from 01/18/14 was reportedly normal with resolution of the previous midbrain lesion (report available but not images).  Most  recent MRI of brain with and without contrast from 10/09/15 was reportedly again unremarkable and showed no significant white matter disease.  Known 6 mm pineal cyst was noted.   At baseline, she had paresthesias of the right side of her face and throat.  She also has paresthesias in the upper extremities and toes, worse on the right.  Sometimes she needs to use a cane. Heat (such as during the summer months), stress and lack of sleep exacerbate chronic symptoms.  She also has restless leg syndrome.  PAST MEDICAL HISTORY: Past Medical History:  Diagnosis Date  . Anxiety   . Bulging discs 1998   cervical  . Complication of anesthesia   . Constipation   . [name] syndrome since late teens  . Headache(784.0)   . Heart palpitations   . Hypertension   . MS (multiple sclerosis) ([place])   . PONV (postoperative nausea and vomiting)     MEDICATIONS: Current Outpatient Prescriptions on File Prior to Visit  Medication Sig Dispense Refill  . ALPRAZolam (XANAX) 0.5 MG tablet Take 1 tablet (0.5 mg total) by mouth at bedtime as needed for anxiety. 30 tablet 1  . Ascorbic Acid (VITAMIN C) 1000 MG tablet Take 1,000 mg by mouth daily. Reported on 12/06/2015    . aspirin 81 MG tablet Take 81 mg by mouth daily.    . Cholecalciferol (VITAMIN D-3 PO) Take by mouth.    . cimetidine (TAGAMET) 200 MG tablet Take 200 mg by mouth 2 (two) times daily.    Marland Kitchen gabapentin (NEURONTIN)  300 MG capsule Take 1 capsule by mouth 2 (two) times daily. 300mg  in a.m; 600mg  in p.m  6  . Garlic 9562 MG CAPS Take by mouth 2 (two) times daily.     Marland Kitchen GILENYA 0.5 MG CAPS TAKE 1 CAPSULE BY MOUTH DAILY 30 capsule 1  . Lactobacillus (PROBIOTIC ACIDOPHILUS) TABS Take by mouth.    . Omega-3 Fatty Acids (FISH OIL) 1000 MG CAPS Take by mouth.    Marland Kitchen omeprazole (PRILOSEC) 40 MG capsule Take 1 capsule (40 mg total) by mouth daily before breakfast. 30 capsule 11  . omeprazole (PRILOSEC) 40 MG capsule Take 40 mg by mouth daily as needed.    .  ranitidine (ZANTAC) 150 MG tablet Take 1 tablet (150 mg total) by mouth daily. 30 tablet 0  . valACYclovir (VALTREX) 500 MG tablet Take 500 mg by mouth as directed.    . vitamin B-12 (CYANOCOBALAMIN) 1000 MCG tablet Take 1 tablet by mouth daily.     No current facility-administered medications on file prior to visit.     ALLERGIES: Allergies  Allergen Reactions  . Morphine Nausea And Vomiting  . Oxycodone Other (See Comments) and Nausea And Vomiting  . Prednisone Other (See Comments)    Causes extreme hypotension    FAMILY HISTORY: Family History  Problem Relation Age of Onset  . Heart disease Father   . Hypertension Mother   . Cancer Mother        SKIN  . Diabetes Mother   . Cancer Maternal Uncle        MELANOMA  . Alzheimer's disease Maternal Grandmother   . Diabetes Maternal Grandmother   . Hypertension Maternal Grandmother   . Colon cancer Neg Hx     SOCIAL HISTORY: Social History   Social History  . Marital status: Married    Spouse name: Cletus Gash   . Number of children: 3  . Years of education: 12   Occupational History  . housewife    Social History Main Topics  . Smoking status: Current Some Day Smoker    Last attempt to quit: 09/08/1994  . Smokeless tobacco: Never Used     Comment: Quit 09/2013  . Alcohol use Yes     Comment: Rare  . Drug use: No  . Sexual activity: Yes     Comment: intercourse age 17, sexualpartners less than 5   Other Topics Concern  . Not on file   Social History Narrative   ** Merged History Encounter **       ** Data from: 05/25/12 Enc Dept: [facility]       ** Data from: 07/20/13 Enc Dept: [facility]   Patient is married Cletus Gash).   Patient has 3 children.    Patient works at [employer] in [place].   Patient has high school education.   Caffeine consumption two cups daily        REVIEW OF SYSTEMS: Constitutional: No fevers, chills, or sweats, no generalized fatigue, change in appetite Eyes: No visual changes,  double vision, eye pain Ear, nose and throat: No hearing loss, ear pain, nasal congestion, sore throat Cardiovascular: No chest pain, palpitations Respiratory:  No shortness of breath at rest or with exertion, wheezes GastrointestinaI: No nausea, vomiting, diarrhea, abdominal pain, fecal incontinence Genitourinary:  No dysuria, urinary retention or frequency Musculoskeletal:  No neck pain, back pain Integumentary: No rash, pruritus, skin lesions Neurological: as above Psychiatric: No depression, insomnia, anxiety Endocrine: No palpitations, fatigue, diaphoresis, mood swings, change in appetite, change  in weight, increased thirst Hematologic/Lymphatic:  No purpura, petechiae. Allergic/Immunologic: no itchy/runny eyes, nasal congestion, recent allergic reactions, rashes  PHYSICAL EXAM: Vitals:   03/23/17 1124  BP: 110/70  Pulse: 82   General: No acute distress.  Patient appears well-groomed.  Head:  Normocephalic/atraumatic Eyes:  Fundi examined but not visualized Neck: supple, no paraspinal tenderness, full range of motion Heart:  Regular rate and rhythm Lungs:  Clear to auscultation bilaterally Back: No paraspinal tenderness Neurological Exam: alert and oriented to person, place, and time. Attention span and concentration intact, recent and remote memory intact, fund of knowledge intact.  Speech fluent and not dysarthric, language intact.  Altered right V2-V3 sensation.  CN II-XII intact. Bulk and tone normal, muscle strength 5/5 throughout.  Sensation to light touch, temperature and vibration intact.  Deep tendon reflexes 2+ throughout, toes downgoing.  Finger to nose and heel to shin testing intact.  Overall normal gait with occasional stumble.  Able to turn.  Able to walk in tandem but stumbled a couple of times.  Timed 25 foot walk 5.45 seconds.  Romberg positive.  IMPRESSION: Multiple sclerosis Depression Tobacco use  PLAN: 1.  Continue Gilenya 2.  Continue D3 and B12 3.   Continue gabapentin 600mg  at bedtime 4.  Check repeat MRI of brain with and without contrast 5.  Repeat CBC with diff and hepatic panel in 6 months (prior to follow up) 6.  She gets annual skin evaluations (small increased risk for skin cancer on Gilenya) 7.  For mood, we will start escitalopram 5mg  daily.  If tolerating in one month, let us know and we can increase dose to 10mg  daily.  We will see if this treats fatigue as well.  If fatigue persists, consider Nuvigil. 8.  Advised to stop smoking 9.  Follow up in 6 months.  25 minutes spent face to face with patient, over 50% spent discussing management.  Metta Clines, DO  CC:  Janace Litten, MD

## 2017-03-23 NOTE — Patient Instructions (Addendum)
1.  Continue Gilenya 2.  Continue D3 and B12 3.  Continue gabapentin 600mg  at bedtime 4.  Check MRI of brain with and without contrast 5.  Repeat CBC with diff and hepatic panel in 6 months (prior to follow up) 6.  Annual skin evaluations 7.  For mood, we will start escitalopram 5mg  daily.  If tolerating in one month, let us know and we can increase dose to 10mg  daily. 8.  Follow up in 6 months.

## 2017-03-26 ENCOUNTER — Telehealth: Payer: Self-pay | Admitting: Neurology

## 2017-03-26 ENCOUNTER — Other Ambulatory Visit: Payer: Self-pay | Admitting: *Deleted

## 2017-03-26 ENCOUNTER — Other Ambulatory Visit: Payer: Self-pay | Admitting: Neurology

## 2017-03-26 DIAGNOSIS — G35 Multiple sclerosis: Secondary | ICD-10-CM

## 2017-03-26 MED ORDER — FINGOLIMOD HCL 0.5 MG PO CAPS: 1.0000 | ORAL_CAPSULE | Freq: Every day | ORAL | 5 refills | Status: DC

## 2017-03-26 NOTE — Telephone Encounter (Signed)
Caller: Abigail Butts  Urgent? No  Reason for the call: Prescription was sent to the wrong pharmacy ([facility]) Should be to the specialty pharmacy. Thanks

## 2017-03-26 NOTE — Telephone Encounter (Signed)
Will send to specialty pharmacy.

## 2017-03-26 NOTE — Telephone Encounter (Signed)
Rx sent 

## 2017-04-14 ENCOUNTER — Ambulatory Visit
Admission: RE | Admit: 2017-04-14 | Discharge: 2017-04-14 | Disposition: A | Discharge status: Home / Self Care | Payer: Federal, State, Local not specified - PPO | Source: Ambulatory Visit | Attending: Neurology | Admitting: Neurology

## 2017-04-14 DIAGNOSIS — G35 Multiple sclerosis: Secondary | ICD-10-CM

## 2017-04-14 MED ORDER — GADOBENATE DIMEGLUMINE 529 MG/ML IV SOLN
20.0000 mL | Freq: Once | INTRAVENOUS | Status: AC | PRN
  Administered 2017-04-14: 20 mL via INTRAVENOUS

## 2017-04-15 ENCOUNTER — Other Ambulatory Visit: Payer: Self-pay

## 2017-04-15 ENCOUNTER — Telehealth: Payer: Self-pay

## 2017-04-15 ENCOUNTER — Telehealth: Payer: Self-pay | Admitting: Neurology

## 2017-04-15 MED ORDER — ESCITALOPRAM OXALATE 10 MG PO TABS: 10.0000 mg | ORAL_TABLET | Freq: Every day | ORAL | 2 refills | Status: DC

## 2017-04-15 NOTE — Telephone Encounter (Signed)
-----   Message from Alda Berthold, DO sent at 04/15/2017  8:57 AM EDT ----- Please inform patient that her MRI looks good, previously seen thoracic abnormality has resolved.  Thanks.

## 2017-04-15 NOTE — Telephone Encounter (Signed)
Patient called back and is very concerned about her MRI results. She does not want to wait until the 27 th when Dr. Tomi Likens returns to get those results. She was asking is there another Doctor that can read them? Please Advise. Thanks

## 2017-04-15 NOTE — Telephone Encounter (Signed)
Called Pt, advsd MRI results-Pt rqstd Lexapro refill, will send in to pharmacy on record, verified w/Pt was [facility]

## 2017-04-15 NOTE — Telephone Encounter (Signed)
Pt indicated depression is much better since starting Lexapro 5mg , though still somewhat tired. When sending in Lexapro refill, saw that Dr Tomi Likens said could increase to 10 mg if tolerating. Called Pt back to let her know of dose change.

## 2017-04-15 NOTE — Telephone Encounter (Signed)
Spoke w/Pt, advsd of MRI results.

## 2017-04-15 NOTE — Telephone Encounter (Signed)
PT called and said she saw her MRI reults on my chart and needs clarification on the results also Dr Tomi Likens put her on Lexapro and she said it is working so she needs a prescription called in

## 2017-05-06 ENCOUNTER — Other Ambulatory Visit: Payer: Self-pay

## 2017-05-06 MED ORDER — ALPRAZOLAM 0.5 MG PO TABS: 0.5000 mg | ORAL_TABLET | Freq: Every evening | ORAL | 1 refills | Status: DC | PRN

## 2017-05-06 NOTE — Telephone Encounter (Signed)
rx called into pharmacy

## 2017-05-06 NOTE — Telephone Encounter (Signed)
Ok for refill? 

## 2017-05-14 ENCOUNTER — Other Ambulatory Visit: Payer: Self-pay | Admitting: Neurology

## 2017-06-19 ENCOUNTER — Other Ambulatory Visit: Payer: Self-pay | Admitting: Neurology

## 2017-06-25 ENCOUNTER — Ambulatory Visit (INDEPENDENT_AMBULATORY_CARE_PROVIDER_SITE_OTHER): Payer: Federal, State, Local not specified - PPO | Admitting: Nurse Practitioner

## 2017-06-25 ENCOUNTER — Encounter: Payer: Self-pay | Admitting: Nurse Practitioner

## 2017-06-25 VITALS — BP 160/100 | HR 84 | Ht 69.0 in | Wt 211.2 lb

## 2017-06-25 DIAGNOSIS — K644 Residual hemorrhoidal skin tags: Secondary | ICD-10-CM

## 2017-06-25 DIAGNOSIS — K6289 Other specified diseases of anus and rectum: Secondary | ICD-10-CM

## 2017-06-25 MED ORDER — HYDROCORTISONE 2.5 % RE CREA: 1.0000 "application " | TOPICAL_CREAM | Freq: Two times a day (BID) | RECTAL | 1 refills | Status: DC

## 2017-06-25 NOTE — Patient Instructions (Addendum)
If you are age 52 or older, your body mass index should be between 23-30. Your Body mass index is 31.2 kg/m. If this is out of the aforementioned range listed, please consider follow up with your Primary Care Provider.  If you are age 49 or younger, your body mass index should be between 19-25. Your Body mass index is 31.2 kg/m. If this is out of the aformentioned range listed, please consider follow up with your Primary Care Provider.   We have sent the following medications to your pharmacy for you to pick up at your convenience: Anusol Cream  Start Soluble Fiber daily.  Follow up with me on July 09, 2017 at 8:30 am.  Thank you for choosing me and [facility].   [name], NP

## 2017-06-25 NOTE — Progress Notes (Signed)
     Chief Complaint:  Rectal bleeding  HPI: Patient is a 51 yo female known to Dr. Ardis Hughs. She had a normal screening colonoscopy with him August 2016, due at 10 year interval. Crystal Simon comes in with complaints of rectal bleeding. A month ago she had bright red blood with a bowel movement. A few days ago she had recurrent rectal bleeding with bowel movement. She's also had some perianal discomfort and wonders about a fissure. Crystal Simon used to struggle with constipation but in the last 1-2 months her stools actually changed to being more liquid to soft consistency. Red meat constipates her so she did change her diet to eating more chicken and vegetables with only occasional red meat . She now has several bowel movements in the morning and right after lunch. Her multiple sclerosis medication was changed in January, other than that she started Lexapro 3 months ago. No other medication changes. No abdominal pain. No fevers. No recent antibiotics. Takes a daily baby aspirin, no other NSAIDs. Based on weights in Epic her weight is up 8 pounds since mid July.   Past Medical History:  Diagnosis Date  . Anxiety   . Bulging discs 1998   cervical  . Complication of anesthesia   . Constipation   . Crystal Simon syndrome since late teens  . Headache(784.0)   . Heart palpitations   . Hypertension   . MS (multiple sclerosis) ([place])   . PONV (postoperative nausea and vomiting)     Patient's surgical history, family medical history, social history, medications and allergies were all reviewed in Epic    Physical Exam: BP (!) 160/100   Pulse 84   Ht 5\' 9"  (1.753 m)   Wt 211 lb 4 oz (95.8 kg)   BMI 31.20 kg/m   GENERAL:  Well developed white female in NAD PSYCH: :Pleasant, cooperative, normal affect EENT:  conjunctiva pink, mucous membranes moist, neck supple without masses CARDIAC:  RRR, no  murmur heard, no peripheral edema PULM: Normal respiratory effort, lungs CTA bilaterally, no wheezing ABDOMEN:   Nondistended, soft, nontender. No obvious masses, no hepatomegaly,  normal bowel sounds SKIN:  turgor, no lesions seen RECTAL: swollen external hemorrhoids. Limited exam due to discomfort but no obvious fissure Musculoskeletal:  Normal muscle tone, normal strength NEURO: Alert and oriented x 3, no focal neurologic deficits    ASSESSMENT and PLAN:   Pleasant 51 yo female with rectal bleeding, perianal pain. Didn't appreciate a fissure but limited exam due to her discomfort.  - Large swollen external hemorrhoids in setting of.hyperdefecation and change in stools consistency. Will treat hemorrhoids with Anusol cream internally and externally for 10 days.  -soluble fiber to try to try and regulate number of daily BMs.  -follow up with me in 2-3 weeks. If hemorrhoids improve but continues to have pain / bleeding will probably treat empirically for fissure.    [name] , NP 06/25/2017, 9:48 AM

## 2017-06-25 NOTE — Progress Notes (Signed)
I agree with the above note, plan 

## 2017-06-29 ENCOUNTER — Telehealth: Payer: Self-pay | Admitting: Neurology

## 2017-06-29 NOTE — Telephone Encounter (Signed)
Called and spoke with Pt. She states that she has noticed increase fatigue for the last few months. Last week she has an episode of rectal bleeding and saw GI, wasn't able to see the Dr, saw the NP. Dx with hemorrhoids, but wasn't able to examine internally due to risk of rupture. Was told then her BP was high and to rest and keep it checked. Was told if no better in a few days will need to be seen. Today her lowest reading has been 158/105. She has transferred her records to a new PCP Dr in [place] but has not established care yet. I advsd her she will need to see a PCP for BP as well as to check labs (hgb) to see if anemic and if that is cause for increased BP. I will advise her to go to urgent care if new PCP is unable to see her, Pt wanted to make sure you were aware.

## 2017-06-29 NOTE — Telephone Encounter (Signed)
Crystal Simon called to let Dr. Tomi Likens know that she has been having headaches and that her blood pressure has been 160/100 and 158/110. She was instructed to give it 3 days and if not any better to call Dr. Tomi Likens. She called in to see if she needs to be seen by Dr. Tomi Likens or her PCP? Please Advise. Thanks

## 2017-07-09 ENCOUNTER — Ambulatory Visit: Payer: Federal, State, Local not specified - PPO | Admitting: Nurse Practitioner

## 2017-07-13 ENCOUNTER — Ambulatory Visit: Payer: Federal, State, Local not specified - PPO | Admitting: Nurse Practitioner

## 2017-07-13 ENCOUNTER — Encounter: Payer: Self-pay | Admitting: Nurse Practitioner

## 2017-07-13 ENCOUNTER — Other Ambulatory Visit (INDEPENDENT_AMBULATORY_CARE_PROVIDER_SITE_OTHER): Payer: Federal, State, Local not specified - PPO

## 2017-07-13 VITALS — BP 148/92 | HR 72 | Ht 67.75 in | Wt 213.0 lb

## 2017-07-13 DIAGNOSIS — R1013 Epigastric pain: Secondary | ICD-10-CM

## 2017-07-13 DIAGNOSIS — R197 Diarrhea, unspecified: Secondary | ICD-10-CM | POA: Diagnosis not present

## 2017-07-13 DIAGNOSIS — K649 Unspecified hemorrhoids: Secondary | ICD-10-CM

## 2017-07-13 DIAGNOSIS — K219 Gastro-esophageal reflux disease without esophagitis: Secondary | ICD-10-CM

## 2017-07-13 LAB — CBC WITH DIFFERENTIAL/PLATELET
BASOS ABS: 0.1 10*3/uL (ref 0.0–0.1)
Basophils Relative: 1.4 % (ref 0.0–3.0)
Eosinophils Absolute: 0.1 10*3/uL (ref 0.0–0.7)
Eosinophils Relative: 1 % (ref 0.0–5.0)
HCT: 41.9 % (ref 36.0–46.0)
Hemoglobin: 14.1 g/dL (ref 12.0–15.0)
LYMPHS ABS: 0.2 10*3/uL — AB (ref 0.7–4.0)
Lymphocytes Relative: 3.5 % — ABNORMAL LOW (ref 12.0–46.0)
MCHC: 33.6 g/dL (ref 30.0–36.0)
MCV: 91.3 fl (ref 78.0–100.0)
MONO ABS: 0.6 10*3/uL (ref 0.1–1.0)
MONOS PCT: 8.7 % (ref 3.0–12.0)
Neutro Abs: 5.4 10*3/uL (ref 1.4–7.7)
Platelets: 290 10*3/uL (ref 150.0–400.0)
RBC: 4.59 Mil/uL (ref 3.87–5.11)
RDW: 12.8 % (ref 11.5–15.5)
WBC: 6.3 10*3/uL (ref 4.0–10.5)

## 2017-07-13 LAB — COMPREHENSIVE METABOLIC PANEL
ALK PHOS: 94 U/L (ref 39–117)
ALT: 30 U/L (ref 0–35)
AST: 18 U/L (ref 0–37)
Albumin: 4.4 g/dL (ref 3.5–5.2)
BILIRUBIN TOTAL: 0.8 mg/dL (ref 0.2–1.2)
BUN: 16 mg/dL (ref 6–23)
CO2: 29 mEq/L (ref 19–32)
Calcium: 9.6 mg/dL (ref 8.4–10.5)
Chloride: 106 mEq/L (ref 96–112)
Creatinine, Ser: 1.03 mg/dL (ref 0.40–1.20)
GFR: 59.61 mL/min — AB (ref >60.00)
GLUCOSE: 90 mg/dL (ref 70–99)
Potassium: 4.6 mEq/L (ref 3.5–5.1)
Sodium: 141 mEq/L (ref 135–145)
TOTAL PROTEIN: 7.2 g/dL (ref 6.0–8.3)

## 2017-07-13 LAB — LIPASE: LIPASE: 35 U/L (ref 11.0–59.0)

## 2017-07-13 MED ORDER — OMEPRAZOLE 40 MG PO CPDR: 40.0000 mg | DELAYED_RELEASE_CAPSULE | Freq: Every day | ORAL | 3 refills | Status: DC

## 2017-07-13 MED ORDER — RANITIDINE HCL 150 MG PO TABS: 150.0000 mg | ORAL_TABLET | Freq: Every day | ORAL | 3 refills | Status: DC

## 2017-07-13 NOTE — Patient Instructions (Addendum)
If you are age 52 or older, your body mass index should be between 23-30. Your Body mass index is 32.63 kg/m. If this is out of the aforementioned range listed, please consider follow up with your Primary Care Provider.  If you are age 69 or younger, your body mass index should be between 19-25. Your Body mass index is 32.63 kg/m. If this is out of the aformentioned range listed, please consider follow up with your Primary Care Provider.   Your physician has requested that you go to the basement for lab work before leaving today.  We have sent the following medications to your pharmacy for you to pick up at your convenience: Omeprazole Ranitidine  Take Imodium 2 mg as directed.  You have been scheduled for an abdominal ultrasound at [facility] (1st floor of hospital) on 07/23/17 at 800 am. Please arrive 15 minutes prior to your appointment for registration. Make certain not to have anything to eat or drink 6 - 8hours prior to your appointment. Should you need to reschedule your appointment, please contact radiology at [phone]. This test typically takes about 30 minutes to perform.  Follow up with [name], NP on July 28, 2017 at 130 pm.  Thank you for choosing me and [facility].   [name], NP

## 2017-07-13 NOTE — Progress Notes (Signed)
Chief Complaint:  Still having loose stools.   HPI: Patient is a 51 year old female known to Dr. Ardis Hughs for hx of GERD.  I saw her 06/25/2017 with complaints of rectal bleeding,  anal discomfort, and increased frequency of stool. I didn't appreciate a fissure on exam but she did have large inflamed external hemorrhoids. Prescribed soluble fiber to slow down number of BMs and Anusol prescribed for hemorrhoids.   Crystal Simon is back for follow up. No further rectal bleeding. She is still having loose to semi- loose postprandial stools.  Stool are often color of food she last ate. There is often a jelly looking substance in the stool but no oily / fatty components. She hasn't had any involuntary weight loss. . No nocturnal stooling but does wake up with lower abdominal cramping sometimes. All of these sx are all relatively new within the last 2-3 months but gradually getting worse. She has been on Gilenya for a year and doesn't think it is responsible. Started lexapro 3 months ago, she does wonder if it contributing.   Ketura has a hx of GERD, takes prilosec every morning and either zantac or tagmet at night. On this regimen she does fairly well. She does complain of postprandial non-radiating epigastric "pressure" 3-4 times a week,  worse with fatty food and she is frequently nauseated.     Past Medical History:  Diagnosis Date  . Anxiety   . Bulging discs 1998   cervical  . Complication of anesthesia   . Constipation   . [name] syndrome since late teens  . Headache(784.0)   . Heart palpitations   . Hypertension   . MS (multiple sclerosis) ([place])   . PONV (postoperative nausea and vomiting)     Patient's surgical history, family medical history, social history, medications and allergies were all reviewed in Epic    Physical Exam: Ht 5' 7.75" (1.721 m)   Wt 213 lb (96.6 kg)   BMI 32.63 kg/m   GENERAL:  Well developed white female in NAD PSYCH: :Pleasant, cooperative, normal  affect EENT:  conjunctiva pink, mucous membranes moist, neck supple without masses CARDIAC:  RRR, no murmur heard, no peripheral edema PULM: Normal respiratory effort, lungs CTA bilaterally, no wheezing ABDOMEN:  Nondistended, soft, nontender. No obvious masses, no hepatomegaly,  normal bowel sounds SKIN:  turgor, no lesions seen Musculoskeletal:  Normal muscle tone, normal strength NEURO: Alert and oriented x 3, no focal neurologic deficits   ASSESSMENT and PLAN:  1. Pleasant 50 year old female with 2 month history of post-prandial loose stools. This is a significant change as she has typically struggled with constipation. Normal screening colonoscopy two years ago.  -stool for c-diff, GI pathogen, lactoferrin. -CMET, CBC  -imodium 2mg . Take 2 after first loose stool then one after each loose stool thereafter. Do not exceed 16 mg day. -I will see her back in a few weeks as she continues to have several GI issues   2. Chronic postprandial, non-radiating epigastric discomfort. Unremarkable EGD for GERD and similar discomfort in 2016. Suspect dyspepsia. She is concerned about gallbladder disease -Doubt gallbladder disease but problem is persistent, EGD in 2016 was negative. She is already on a PPI. Will check an abdominal ultrasound. -CMET   3. Hemorrhoid. Not using using anusol right now due to frequent loose stool .  No longer bleeding since she stopped aspirin.  Some minor hemorrhoidal discomfort.  We will readdress hemorrhoids after resolution of #1  4. GERD.  Symptoms controlled  with diet , a.m. Prilosec and ranitidine or Tagamet at bedtime -We will refill Zantac per her request, 150 mg nightly #30 with 3 refills   [name] , NP 07/13/2017, 1:43 PM

## 2017-07-14 ENCOUNTER — Other Ambulatory Visit: Payer: Federal, State, Local not specified - PPO

## 2017-07-14 DIAGNOSIS — R197 Diarrhea, unspecified: Secondary | ICD-10-CM

## 2017-07-14 DIAGNOSIS — K219 Gastro-esophageal reflux disease without esophagitis: Secondary | ICD-10-CM

## 2017-07-14 DIAGNOSIS — R1013 Epigastric pain: Secondary | ICD-10-CM

## 2017-07-15 ENCOUNTER — Encounter: Payer: Self-pay | Admitting: Nurse Practitioner

## 2017-07-15 LAB — CLOSTRIDIUM DIFFICILE BY PCR: Toxigenic C. Difficile by PCR: NOT DETECTED

## 2017-07-15 NOTE — Progress Notes (Signed)
I agree with the above note, plan 

## 2017-07-20 ENCOUNTER — Telehealth: Payer: Self-pay

## 2017-07-20 MED ORDER — GABAPENTIN 300 MG PO CAPS: 600.0000 mg | ORAL_CAPSULE | Freq: Every day | ORAL | 6 refills | Status: DC

## 2017-07-20 NOTE — Telephone Encounter (Signed)
[facility] called and said they have a question regarding pt's gabapentin CB#[phone]

## 2017-07-20 NOTE — Telephone Encounter (Signed)
Spoke with Crystal Simon at [facility] - Rx sent was for #120, asked that it be changed to #180 (90 day supply)

## 2017-07-23 ENCOUNTER — Ambulatory Visit (HOSPITAL_COMMUNITY)
Admission: RE | Admit: 2017-07-23 | Discharge: 2017-07-23 | Disposition: A | Discharge status: Home / Self Care | Payer: Federal, State, Local not specified - PPO | Source: Ambulatory Visit | Attending: Nurse Practitioner | Admitting: Nurse Practitioner

## 2017-07-23 DIAGNOSIS — K219 Gastro-esophageal reflux disease without esophagitis: Secondary | ICD-10-CM | POA: Diagnosis not present

## 2017-07-23 DIAGNOSIS — R1013 Epigastric pain: Secondary | ICD-10-CM

## 2017-07-23 DIAGNOSIS — K8020 Calculus of gallbladder without cholecystitis without obstruction: Secondary | ICD-10-CM | POA: Insufficient documentation

## 2017-07-23 LAB — GASTROINTESTINAL PATHOGEN PANEL PCR
C. difficile Tox A/B, PCR: NOT DETECTED
CAMPYLOBACTER, PCR: NOT DETECTED
Cryptosporidium, PCR: NOT DETECTED
E COLI (STEC) STX1/STX2, PCR: NOT DETECTED
E coli (ETEC) LT/ST PCR: NOT DETECTED
E coli 0157, PCR: NOT DETECTED
GIARDIA LAMBLIA, PCR: NOT DETECTED
Norovirus, PCR: NOT DETECTED
ROTAVIRUS, PCR: NOT DETECTED
SALMONELLA, PCR: NOT DETECTED
SHIGELLA, PCR: NOT DETECTED

## 2017-07-28 ENCOUNTER — Ambulatory Visit: Payer: Federal, State, Local not specified - PPO | Admitting: Nurse Practitioner

## 2017-07-28 ENCOUNTER — Encounter: Payer: Self-pay | Admitting: Nurse Practitioner

## 2017-07-28 VITALS — BP 116/68 | HR 76

## 2017-07-28 DIAGNOSIS — K8081 Other cholelithiasis with obstruction: Secondary | ICD-10-CM

## 2017-07-28 DIAGNOSIS — R197 Diarrhea, unspecified: Secondary | ICD-10-CM

## 2017-07-28 DIAGNOSIS — K649 Unspecified hemorrhoids: Secondary | ICD-10-CM

## 2017-07-28 DIAGNOSIS — K5900 Constipation, unspecified: Secondary | ICD-10-CM | POA: Diagnosis not present

## 2017-07-28 DIAGNOSIS — K219 Gastro-esophageal reflux disease without esophagitis: Secondary | ICD-10-CM | POA: Diagnosis not present

## 2017-07-28 DIAGNOSIS — R109 Unspecified abdominal pain: Secondary | ICD-10-CM

## 2017-07-28 NOTE — Progress Notes (Signed)
     Chief Complaint:  Chronic nausea,   HPI: Patient is a  51 yo female with IBS who I have been seeing lately for loose stools, upper abdominal pain. Stool pathogen panel negative. She had a colonoscopy in Aug 2016. Right after our visit on 11/5 when I recommended imodium as needed she developed constipation before even starting the imodium. She started eating shredded wheat then today she passed a hard stool followed by loose stool.   Yajayra has chronic epigastric "pressure" and burning, worse after eating. She has chronic nausea. She takes NSAIDs on a regular basis but has for years. We did an u/s and a gallstone was present in the gallbladder.  She does, in retrospect recall having frequent pain in between her shoulder blades but thought it was more of a back pain rather than pain radiating through from abdomen to her back. Laying down helps the pain   She has a hx of GERD, asymptomatic as long as she avoids trigger foods. If she does get breakthrough symptoms then takes a second PPI in the evening or during the night.   Past Medical History:  Diagnosis Date  . Anxiety   . Bulging discs 1998   cervical  . Complication of anesthesia   . Constipation   . [name] syndrome since late teens  . Headache(784.0)   . Heart palpitations   . Hypertension   . MS (multiple sclerosis) ([place])   . PONV (postoperative nausea and vomiting)     Patient's surgical history, family medical history, social history, medications and allergies were all reviewed in Epic    Physical Exam: BP 116/68   Pulse 76   GENERAL: well developed white female in NAD PSYCH: :Pleasant, cooperative, normal affect EENT:  conjunctiva pink, mucous membranes moist, neck supple without masses CARDIAC:  RRR, no murmur heard, no peripheral edema PULM: Normal respiratory effort, lungs CTA bilaterally, no wheezing ABDOMEN:  Nondistended, soft, nontender. No obvious masses, no hepatomegaly,  normal bowel sounds SKIN:   turgor, no lesions seen Musculoskeletal:  Normal muscle tone, normal strength NEURO: Alert and oriented x 3, no focal neurologic deficits   ASSESSMENT and PLAN:  1. Pleasant 51 year old female with IBS and mixed alternating constipation / diarrhea.who I saw mid October and earlier this month with several GI issues. She continues to have post-prandial epigastric " pressure" and nausea. EGD in 2016 for similar sx was unrevealing even in setting of chronic nsaid use. LFTs , lipase normal.  She also has chronic discomfort between shoulder blades but this may be musculoskeletal in nature. Recent u/s reveals 1.4 cm in gallbladder.  -Etiology of chronic upper abdominal pain unclear. Cholelithiasis without cholecystitis on recent u/s. Surgical evaluation is reasonable, especially since GI evaluation negative to date.   2. GERD, overall manages well with diet and PPI  [name] , NP 07/28/2017, 1:40 PM

## 2017-07-28 NOTE — Patient Instructions (Signed)
If you are age 52 or older, your body mass index should be between 23-30. Your There is no height or weight on file to calculate BMI. If this is out of the aforementioned range listed, please consider follow up with your Primary Care Provider.  If you are age 69 or younger, your body mass index should be between 19-25. Your There is no height or weight on file to calculate BMI. If this is out of the aformentioned range listed, please consider follow up with your Primary Care Provider.   Call Crystal Simon if you have any recurrent rectal bleeding. Crystal Simon will call you about surgical evaluation.  Thank you for choosing me and [facility].   Crystal Savoy, NP

## 2017-07-31 ENCOUNTER — Encounter: Payer: Self-pay | Admitting: Nurse Practitioner

## 2017-08-02 NOTE — Progress Notes (Signed)
I agree with the above note, plan 

## 2017-08-04 ENCOUNTER — Telehealth: Payer: Self-pay

## 2017-08-04 NOTE — Telephone Encounter (Signed)
Patient contacted. She agrees to this plan of care. Referral sent to [facility].

## 2017-08-04 NOTE — Telephone Encounter (Signed)
-----   Message from Mauri Pole, MD sent at 08/04/2017  4:35 PM EST -----   ----- Message ----- From: Greggory Keen, LPN Sent: [id number]   4:34 PM To: Mauri Pole, MD    ----- Message ----- From: Willia Craze, NP Sent: 08/03/2017   9:39 PM To: Greggory Keen, LPN  [facility], can you call patient and let her know that Dr. Ardis Hughs agrees with surgical evalation of upper abdominal pain and choelithiasis. Can you refer her to [facility] thanks

## 2017-08-07 NOTE — Telephone Encounter (Signed)
Patient states she has not heard from [facility] about referral yet. Her best call back # is [phone].

## 2017-08-07 NOTE — Telephone Encounter (Signed)
Left details on her voicemail.

## 2017-08-07 NOTE — Telephone Encounter (Signed)
Patient started back on her ASA today. She is seeing the bright red blood again with the bowel movement. Soft BM. No diarrhea or pain. Please advise.  Message left with referral coordinator at [facility]. Referral was faxed 08/04/17.

## 2017-08-07 NOTE — Telephone Encounter (Signed)
She should stop  The ASA indefinitely.  rov with me, next available. Add to office wait list as well.  tahnks

## 2017-08-18 ENCOUNTER — Other Ambulatory Visit: Payer: Self-pay

## 2017-08-18 MED ORDER — ALPRAZOLAM 0.5 MG PO TABS: 0.5000 mg | ORAL_TABLET | Freq: Every evening | ORAL | 0 refills | Status: DC | PRN

## 2017-08-18 NOTE — Telephone Encounter (Signed)
Requesting Xanax refill. 

## 2017-08-18 NOTE — Telephone Encounter (Signed)
Called into pharmacy

## 2017-08-24 ENCOUNTER — Ambulatory Visit: Payer: Self-pay | Admitting: Surgery

## 2017-08-24 NOTE — H&P (Signed)
Crystal Simon Documented: 08/24/2017 10:48 AM Location: [facility] Patient #: [id number] DOB: Sep 08, 1966 Married / Language: Crystal Simon / Race: White Female  History of Present Illness Crystal Moores A. Kyannah Climer MD; 08/24/2017 11:23 AM) Patient words: Patient presents at the request of [name], NP for a multiply history of chronic epigastric abdominal pain. She has a history of IBS and has been worked up extensively with endoscopy. She is found to have a gallstone on recent ultrasound. She is epigastric and right upper quadrant pain which radiates to her back. This is chronic and comes and goes. It is moderate intensity without exacerbating features. She is very specific with her diet and secondary to her multiple sclerosis has to eat at night for her medication. She thinks this may make it worse. There is no specific food that makes it worse.       CLINICAL DATA: Abdominal pain for 5 months. Rectal bleeding.  EXAM: ULTRASOUND ABDOMEN LIMITED RIGHT UPPER QUADRANT  COMPARISON: None.  FINDINGS: Gallbladder:  There is a 1.4 cm stone in the gallbladder with no wall thickening, pericholecystic fluid, or Murphy's sign.  Common bile duct:  Diameter: 2.6 mm  Liver:  No focal lesion identified. Within normal limits in parenchymal echogenicity. Portal vein is patent on color Doppler imaging with normal direction of blood flow towards the liver.  IMPRESSION: 1. Cholelithiasis. 2. No other abnormalities identified.   Electronically Signed By: Dorise Bullion III M.D On: 07/23/2017 08:58.  The patient is a 51 year old female.   Past Surgical History ([name], [place]; 08/24/2017 10:48 AM) Hemorrhoidectomy Hysterectomy (not due to cancer) - Partial Oral Surgery Shoulder Surgery Left.  Diagnostic Studies History ([name], [place]; 08/24/2017 10:48 AM) Colonoscopy 1-5 years ago Mammogram 1-3 years ago Pap Smear 1-5 years ago  Allergies  ([name], RMA; 08/24/2017 10:50 AM) Morphine Sulfate (PF) *ANALGESICS - OPIOID* OxyCODONE HCl *ANALGESICS - OPIOID* PredniSONE *CORTICOSTEROIDS* Allergies Reconciled  Medication History ([name], [place]; 08/24/2017 10:55 AM) ALPRAZolam (0.5MG  Tablet, Oral) Active. Aspirin (81MG  Tablet, Oral) Active. Biotin (5MG  Capsule, Oral) Active. Bystolic (5MG  Tablet, Oral) Active. Vitamin D (Cholecalciferol) (Oral) Specific strength unknown - Active. Cimetidine (200MG  Tablet, Oral) Active. Lexapro (10MG  Tablet, Oral) Active. Neurontin (300MG  Capsule, Oral) Active. Garlic (1000MG  Capsule, Oral) Active. Gilenya (0.5MG  Capsule, Oral) Active. Hydrocortisone (2.5% Cream, External) Active. Ibuprin (200MG  Tablet, Oral) Active. Melatonin (10MG  Tablet, Oral) Active. Omega 3 (1000MG  Capsule, Oral) Active. Omeprazole (40MG  Capsule DR, Oral) Active. Zantac (150MG  Tablet, Oral) Active. Valtrex (500MG  Tablet, Oral) Active. Vitamin B-12 (1000MCG Tablet, Oral) Active. Medications Reconciled  Social History ([name], [place]; 08/24/2017 10:49 AM) Alcohol use Remotely quit alcohol use. Caffeine use Carbonated beverages, Coffee, Tea. No drug use Tobacco use Current every day smoker.  Family History ([name], [place]; 08/24/2017 10:48 AM) Alcohol Abuse Brother. Arthritis Mother. Colon Polyps Father, Mother. Depression Mother. Diabetes Mellitus Mother. Heart Disease Family Members In General. Hypertension Family Members In General, Mother. Melanoma Family Members In General.  Pregnancy / Birth History ([name], RMA; 08/24/2017 10:49 AM) Age at menarche 66 years. Age of menopause 44-50 Gravida 3 Maternal age 81-20 Para 3  Other Problems ([name], [place]; 08/24/2017 10:49 AM) Anxiety Disorder Arthritis Back Pain Chest pain Cholelithiasis Depression Gastroesophageal Reflux Disease General anesthesia -  complications Hemorrhoids High blood pressure Hypercholesterolemia Other disease, cancer, significant illness     Review of Systems ([name] RMA; 08/24/2017 10:49 AM) General Present- Appetite Loss, Chills, Fatigue and  Weight Gain. Not Present- Fever, Night Sweats and Weight Loss. Skin Not Present- Change in Wart/Mole, Dryness, Hives, Jaundice, New Lesions, Non-Healing Wounds, Rash and Ulcer. HEENT Present- Oral Ulcers, Seasonal Allergies, Sinus Pain and Visual Disturbances. Not Present- Earache, Hearing Loss, Hoarseness, Nose Bleed, Ringing in the Ears, Sore Throat, Wears glasses/contact lenses and Yellow Eyes. Respiratory Present- Snoring. Not Present- Bloody sputum, Chronic Cough, Difficulty Breathing and Wheezing. Cardiovascular Present- Chest Pain, Palpitations, Rapid Heart Rate and Shortness of Breath. Not Present- Difficulty Breathing Lying Down, Leg Cramps and Swelling of Extremities. Gastrointestinal Present- Abdominal Pain, Bloating, Change in Bowel Habits, Chronic diarrhea, Constipation, Difficulty Swallowing, Gets full quickly at meals, Hemorrhoids, Indigestion and Nausea. Not Present- Bloody Stool, Excessive gas, Rectal Pain and Vomiting. Female Genitourinary Present- Pelvic Pain. Not Present- Frequency, Nocturia, Painful Urination and Urgency. Musculoskeletal Present- Back Pain, Joint Pain, Joint Stiffness, Muscle Pain and Muscle Weakness. Not Present- Swelling of Extremities. Neurological Present- Headaches, Numbness, Tingling, Trouble walking and Weakness. Not Present- Decreased Memory, Fainting, Seizures and Tremor. Psychiatric Present- Anxiety and Depression. Not Present- Bipolar, Change in Sleep Pattern, Fearful and Frequent crying. Endocrine Present- Heat Intolerance and Hot flashes. Not Present- Cold Intolerance, Excessive Hunger, Hair Changes and New Diabetes. Hematology Not Present- Blood Thinners, Easy Bruising, Excessive bleeding, Gland problems, HIV and  Persistent Infections.  Vitals ([name] RMA; 08/24/2017 10:49 AM) 08/24/2017 10:49 AM Weight: 213.5 lb Height: 69in Body Surface Area: 2.12 m Body Mass Index: 31.53 kg/m  Temp.: 97.80F  Pulse: 66 (Regular)  BP: 128/84 (Sitting, Left Arm, Standard)      Physical Exam (Jessicia Napolitano A. Jolane Bankhead MD; 08/24/2017 11:23 AM)  General Mental Status-Alert. General Appearance-Consistent with stated age. Hydration-Well hydrated. Voice-Normal.  Head and Neck Head-normocephalic, atraumatic with no lesions or palpable masses.  Chest and Lung Exam Chest and lung exam reveals -quiet, even and easy respiratory effort with no use of accessory muscles and on auscultation, normal breath sounds, no adventitious sounds and normal vocal resonance. Inspection Chest Wall - Normal. Back - normal.  Cardiovascular Cardiovascular examination reveals -on palpation PMI is normal in location and amplitude, no palpable S3 or S4. Normal cardiac borders., normal heart sounds, regular rate and rhythm with no murmurs, carotid auscultation reveals no bruits and normal pedal pulses bilaterally.  Abdomen Inspection Inspection of the abdomen reveals - No Hernias. Skin - Scar - no surgical scars. Palpation/Percussion Palpation and Percussion of the abdomen reveal - Soft, Non Tender, No Rebound tenderness, No Rigidity (guarding) and No hepatosplenomegaly. Auscultation Auscultation of the abdomen reveals - Bowel sounds normal.  Neurologic Note: Walks with cane secondary to MS. He is able to get on and off the examination table without difficulty.    Assessment & Plan (Janice Seales A. Sohaib Vereen MD; 08/24/2017 11:24 AM)  SYMPTOMATIC CHOLELITHIASIS (K80.20) Impression: Discussed options of cholecystectomy with the patient. Risks, benefits and other options discussed. Medical management discussed. The pros and cons of surgery and possibility of complication discussed.      The procedure  has been discussed with the patient. Risks of laparoscopic cholecystectomy include bleeding, infection, bile duct injury, leak, death, open surgery, diarrhea, other surgery, organ injury, blood vessel injury, DVT, and additional care.  Current Plans Pt Education - Laparoscopic Cholecystectomy: gallbladder Pt Education - [facility] Laparosopic Post Op [facility] ([place]) The anatomy & physiology of hepatobiliary & pancreatic function was discussed. The pathophysiology of gallbladder dysfunction was discussed. Natural history risks without surgery was discussed. I feel the risks of no intervention will lead to serious problems  that outweigh the operative risks; therefore, I recommended cholecystectomy to remove the pathology. I explained laparoscopic techniques with possible need for an open approach. Probable cholangiogram to evaluate the bilary tract was explained as well.  Risks such as bleeding, infection, abscess, leak, injury to other organs, need for further treatment, heart attack, death, and other risks were discussed. I noted a good likelihood this will help address the problem. Possibility that this will not correct all abdominal symptoms was explained. Goals of post-operative recovery were discussed as well. We will work to minimize complications. An educational handout further explaining the pathology and treatment options was given as well. Questions were answered. The patient expresses understanding & wishes to proceed with surgery.  Pt Education - [place] ([place]) Written instructions provided

## 2017-08-26 ENCOUNTER — Telehealth: Payer: Self-pay | Admitting: Neurology

## 2017-08-26 NOTE — Telephone Encounter (Signed)
[facility] called and wanted refills for pt for gilenya CB# [phone]

## 2017-08-28 MED ORDER — FINGOLIMOD HCL 0.5 MG PO CAPS: 1.0000 | ORAL_CAPSULE | Freq: Every day | ORAL | 3 refills | Status: DC

## 2017-08-28 NOTE — Telephone Encounter (Signed)
Jacqualine called back and they told her they do not need to speak with you, they just need a new prescription because the other has ran out. Thanks

## 2017-08-28 NOTE — Telephone Encounter (Signed)
[facility]

## 2017-08-28 NOTE — Telephone Encounter (Signed)
Patient called and asked could the prescription be faxed to [facility]? Please Advise. Thanks

## 2017-09-02 ENCOUNTER — Telehealth: Payer: Self-pay

## 2017-09-02 NOTE — Telephone Encounter (Signed)
I called Dr Deatra Robinson office in [place], I asked that a message be sent to the nurse. Even though Dr Donnetta Hutching mentioned in office note that he will Rx Bystolic at a low dose to avoid side effects, and Gilenya was listed on medication list for that visit, I wanted to ensure they were aware of increased effect of Bystolic when taking Gilenya.

## 2017-09-02 NOTE — Telephone Encounter (Signed)
Rod Holler (pharmacist) at [facility] called and wanted to make sure we are aware Pt is on Bystolic before she refills Gilenya. Gilenya will increase effect of Bystolic, though Pt is on a very low dose. Advised her we did not Rx the Bystolic, looks like Dr Donalynn Furlong did but I will make Dr Tomi Likens aware.

## 2017-09-02 NOTE — Telephone Encounter (Signed)
She was recently started on Bystolic by her cardiologist.  I think the pharmacist should contact the cardiologist to determine if the Bystolic needs to be changed.

## 2017-09-18 ENCOUNTER — Other Ambulatory Visit: Payer: Self-pay | Admitting: Gynecology

## 2017-09-18 NOTE — Telephone Encounter (Signed)
Check with patient about her refill for Xanax.  I do not see where we had discussed this or Crystal Simon with her previously.

## 2017-09-18 NOTE — Telephone Encounter (Signed)
NY's 09/16/13 visit states "Xanax 0.25 at bedtime when necessary for situational stress. Aware of addictive properties, uses sparingly."  She has used it off and on since then.

## 2017-09-23 ENCOUNTER — Ambulatory Visit: Payer: Federal, State, Local not specified - PPO | Admitting: Neurology

## 2017-09-24 ENCOUNTER — Encounter (HOSPITAL_COMMUNITY)
Admission: RE | Admit: 2017-09-24 | Discharge: 2017-09-24 | Disposition: A | Discharge status: Home / Self Care | Payer: Federal, State, Local not specified - PPO | Source: Ambulatory Visit | Attending: Surgery | Admitting: Surgery

## 2017-09-24 ENCOUNTER — Other Ambulatory Visit: Payer: Self-pay

## 2017-09-24 ENCOUNTER — Encounter (HOSPITAL_COMMUNITY): Payer: Self-pay

## 2017-09-24 DIAGNOSIS — I10 Essential (primary) hypertension: Secondary | ICD-10-CM | POA: Insufficient documentation

## 2017-09-24 DIAGNOSIS — M5090 Cervical disc disorder, unspecified, unspecified cervical region: Secondary | ICD-10-CM | POA: Insufficient documentation

## 2017-09-24 DIAGNOSIS — K219 Gastro-esophageal reflux disease without esophagitis: Secondary | ICD-10-CM | POA: Diagnosis not present

## 2017-09-24 DIAGNOSIS — F419 Anxiety disorder, unspecified: Secondary | ICD-10-CM | POA: Insufficient documentation

## 2017-09-24 DIAGNOSIS — Z01812 Encounter for preprocedural laboratory examination: Secondary | ICD-10-CM | POA: Insufficient documentation

## 2017-09-24 DIAGNOSIS — Z79899 Other long term (current) drug therapy: Secondary | ICD-10-CM | POA: Diagnosis not present

## 2017-09-24 DIAGNOSIS — G35 Multiple sclerosis: Secondary | ICD-10-CM | POA: Insufficient documentation

## 2017-09-24 DIAGNOSIS — F329 Major depressive disorder, single episode, unspecified: Secondary | ICD-10-CM | POA: Diagnosis not present

## 2017-09-24 DIAGNOSIS — Z90710 Acquired absence of both cervix and uterus: Secondary | ICD-10-CM | POA: Diagnosis not present

## 2017-09-24 DIAGNOSIS — F17200 Nicotine dependence, unspecified, uncomplicated: Secondary | ICD-10-CM | POA: Insufficient documentation

## 2017-09-24 HISTORY — DX: Nonrheumatic mitral (valve) prolapse: I34.1

## 2017-09-24 HISTORY — DX: Major depressive disorder, single episode, unspecified: F32.9

## 2017-09-24 HISTORY — DX: Unspecified osteoarthritis, unspecified site: M19.90

## 2017-09-24 HISTORY — DX: Cardiac arrhythmia, unspecified: I49.9

## 2017-09-24 HISTORY — DX: Depression, unspecified: F32.A

## 2017-09-24 HISTORY — DX: Gastro-esophageal reflux disease without esophagitis: K21.9

## 2017-09-24 LAB — COMPREHENSIVE METABOLIC PANEL
ALBUMIN: 4.1 g/dL (ref 3.5–5.0)
ALK PHOS: 96 U/L (ref 38–126)
ALT: 69 U/L — AB (ref 14–54)
ANION GAP: 10 (ref 5–15)
AST: 52 U/L — AB (ref 15–41)
BUN: 10 mg/dL (ref 6–20)
CALCIUM: 9.2 mg/dL (ref 8.9–10.3)
CO2: 25 mmol/L (ref 22–32)
Chloride: 107 mmol/L (ref 101–111)
Creatinine, Ser: 0.96 mg/dL (ref 0.44–1.00)
GFR calc Af Amer: >60 mL/min (ref >60)
GFR calc non Af Amer: >60 mL/min (ref >60)
GLUCOSE: 90 mg/dL (ref 65–99)
Potassium: 4.3 mmol/L (ref 3.5–5.1)
SODIUM: 142 mmol/L (ref 135–145)
Total Bilirubin: 1.3 mg/dL — ABNORMAL HIGH (ref 0.3–1.2)
Total Protein: 6.9 g/dL (ref 6.5–8.1)

## 2017-09-24 LAB — CBC WITH DIFFERENTIAL/PLATELET
BASOS ABS: 0 10*3/uL (ref 0.0–0.1)
BASOS PCT: 1 %
EOS ABS: 0.1 10*3/uL (ref 0.0–0.7)
Eosinophils Relative: 2 %
HEMATOCRIT: 43 % (ref 36.0–46.0)
HEMOGLOBIN: 14.3 g/dL (ref 12.0–15.0)
Lymphocytes Relative: 8 %
Lymphs Abs: 0.3 10*3/uL — ABNORMAL LOW (ref 0.7–4.0)
MCH: 30.5 pg (ref 26.0–34.0)
MCHC: 33.3 g/dL (ref 30.0–36.0)
MCV: 91.7 fL (ref 78.0–100.0)
Monocytes Absolute: 0.4 10*3/uL (ref 0.1–1.0)
Monocytes Relative: 9 %
NEUTROS ABS: 3.6 10*3/uL (ref 1.7–7.7)
NEUTROS PCT: 80 %
Platelets: 266 10*3/uL (ref 150–400)
RBC: 4.69 MIL/uL (ref 3.87–5.11)
RDW: 12.8 % (ref 11.5–15.5)
WBC: 4.4 10*3/uL (ref 4.0–10.5)

## 2017-09-24 NOTE — Progress Notes (Addendum)
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 Saw Dr. Rondel Oh 07/09/2016 (office note in epic)  Stress test "normal" Currently sees Dr. Donnetta Hutching  LOV 06/30/2017 Neurologist is Dr. Tomi Likens  LOV 06/2017  She has MS. She states that with the Gilenya, which she started back in 09/2016, she had to be monitored for 8 hrs in her home by cardiologist. EKG had to be done 7 days after starting the  Bystolic. Am requesting info from [facility] & Dr. Deatra Robinson office. Patient has no one to stay with her after surgery.  Spouse is in hospital now with diverticulitis.

## 2017-09-24 NOTE — Pre-Procedure Instructions (Signed)
Crystal Simon  09/24/2017      [facility], [place], [place] [address] Phone: [phone] Fax: [place], [place] [address] [place] [address] Phone: [phone] Fax: [phone]    Your procedure is scheduled on Wednesday, January 23rd               Report to [facility] Admitting at 1:30 PM             (posted surgery time 3:30 pm - 5:00 pm)   Call this number if you have problems the morning of surgery:  [phone]   Remember:   Do not eat food or drink liquids after midnight, Tuesday.   Take these medicines the morning of surgery with A SIP OF WATER : Xanax, Bystolic, Lexapro, Gapapentin, Omeprazole.   Do not wear jewelry, make-up or nail polish.  Do not wear lotions, powders,  perfumes, or deodorant.  Do not shave 48 hours prior to surgery.    Do not bring valuables to the hospital.  [facility] is not responsible for any belongings or valuables.  Contacts, dentures or bridgework may not be worn into surgery.  Leave your suitcase in the car.  After surgery it may be brought to your room.  For patients admitted to the hospital, discharge time will be determined by your treatment team.  Patients discharged the day of surgery will not be allowed to drive home, and will need someone to stay with you for the first 24 hrs.  Please read over the following fact sheets that you were given. Pain Booklet and Surgical Site Infection Prevention       [facility]- Preparing For Surgery  Before surgery, you can play an important role. Because skin is not sterile, your skin needs to be as free of germs as possible. You can reduce the number of germs on your skin by washing with CHG (chlorahexidine gluconate) Soap before surgery.  CHG is an antiseptic cleaner which kills germs and bonds with the skin to  continue killing germs even after washing.  Please do not use if you have an allergy to CHG or antibacterial soaps. If your skin becomes reddened/irritated stop using the CHG.  Do not shave (including legs and underarms) for at least 48 hours prior to first CHG shower. It is OK to shave your face.  Please follow these instructions carefully.   1. Shower the NIGHT BEFORE SURGERY and the MORNING OF SURGERY with CHG.   2. If you chose to wash your hair, wash your hair first as usual with your normal shampoo.  3. After you shampoo, rinse your hair and body thoroughly to remove the shampoo.  4. Use CHG as you would any other liquid soap. You can apply CHG directly to the skin and wash gently with a scrungie or a clean washcloth.   5. Apply the CHG Soap to your body ONLY FROM THE NECK DOWN.  Do not use on open wounds or open sores. Avoid contact with your eyes, ears, mouth and genitals (private parts). Wash Face and genitals (private parts)  with your normal soap.  6. Wash thoroughly, paying special attention to the area where your surgery will be performed.  7. Thoroughly rinse your body with warm water from the neck down.  8. DO NOT shower/wash  with your normal soap after using and rinsing off the CHG Soap.  9. Pat yourself dry with a CLEAN TOWEL.  10. Wear CLEAN PAJAMAS to bed the night before surgery, wear comfortable clothes the morning of surgery  11. Place CLEAN SHEETS on your bed the night of your first shower and DO NOT SLEEP WITH PETS.    Day of Surgery: Do not apply any deodorants/lotions. Please wear clean clothes to the hospital/surgery center.

## 2017-09-24 NOTE — Telephone Encounter (Signed)
Rx was never called in, I called Rx in today.pt aware.

## 2017-09-25 NOTE — Progress Notes (Addendum)
Anesthesia Chart Review: Patient is a 52 year old female schedule for laparoscopic cholecystectomy on 09/30/17 by Dr. Erroll Luna.  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 BMI is consistent with obesity.   - PCP is listed as Dr. Reinaldo Meeker. - Neurologist is Dr. Metta Clines with [facility]. - Cardiologist is Dr. Ricky Ala with [facility] ([facility]). Last visit 06/30/17.  - GI is Dr. Owens Loffler.  Meds include Xanax, biotin, Bystolic 5 mg daily, Lexapro, fingolimod (Gilenya; started 09/2016), Neurontin, garlic, melatonin, fish oil, Prilosec, Zantac, Valtrex.   BP (!) 161/90   Pulse 65   Temp 36.7 C (Oral)   Resp 20   Ht 5\' 9"  (1.753 m)   Wt 214 lb 8 oz (97.3 kg)   SpO2 100%   BMI 31.68 kg/m   EKG 07/07/17 [facility]): (Tracing on chart) Ventricular Rate 65BPM  Atrial Rate65BPM  P-R Interval 160 ms QRS Duration 53ms Q-T Interval 422 ms QTC438 ms P Axis 46degrees  R Axis 46degrees  T Axis 56degrees  Normal sinus rhythm Normal ECG  Echo 01/28/17 [facility]): REPORT REQUESTED.  According to 08/09/25 cardiology note by Dr. Jenne Campus (Care Everywhere), she had a negative stress echo ~ 07/03/15.   Preoperative labs noted. Cr 0.96. AST 52, ALT 69. Total bilirubin 1.3. Glucose 90. WBC 4.4. H/H 14.3/43.0.  PLT 266. Lymphocyte # 0.3 K/ul, lymphocyte % 8%. LFTs are mildly elevated since 07/13/17, but otherwise CBC results are stable.   Gilenya has known common reaction of elevated LFTs and lymphopenia/leukopenia. It is also known to increase risk for 1st/2nd degree block (common reaction) and QT prolongation (serious reaction). It appears she has been on Gilenya since ~ 09/2016 and stated on Bystolic 70/26/37. She had an EKG 07/07/17 to check for any EKG changes. This tracing showed NSR, PR interval of 160, QT 422 ms. Also, Dr. Tomi Likens had his staff contact Dr. Deatra Robinson office (09/02/17) just to make sure he was aware of the potential for interaction. No medication changes were made.    Will follow-up Echo from Dr. Deatra Robinson former [facility] office.  [facility] Stay [facility]/Anesthesiology Phone [phone] 09/25/2017 11:59 AM  Addendum: Echo 01/28/17: Conclusion: 1. Mild concentric left ventricular hypertrophy with normal systolic function. EF 65%. 2. No appreciable mitral prolapse, only trace MR. 3. Normal right heart size/function, trace TR.  Based on currently available information, I anticipate that she can proceed as planned if no acute changes. Will need to take into account that Gilenya increases risk for prolonged QT and 1st/2nd degree AV block. EKG 06/2017 showed normal PR and QT intervals.   [facility] Short Stay Center/Anesthesiology Phone [phone] 09/29/2017 3:13 PM

## 2017-09-28 ENCOUNTER — Ambulatory Visit: Payer: Federal, State, Local not specified - PPO | Admitting: Gastroenterology

## 2017-09-29 MED ORDER — DEXTROSE 5 % IV SOLN
3.0000 g | INTRAVENOUS | Status: AC
  Administered 2017-09-30: 3 g via INTRAVENOUS
  Filled 2017-09-29: qty 3000

## 2017-09-29 NOTE — Anesthesia Preprocedure Evaluation (Addendum)
Anesthesia Evaluation  Patient identified by MRN, date of birth, ID band Patient awake    Reviewed: Allergy & Precautions, H&P , NPO status , Patient's Chart, lab work & pertinent test results  History of Anesthesia Complications (+) PONV and history of anesthetic complications  Airway Mallampati: II   Neck ROM: full    Dental   Pulmonary Current Smoker,    breath sounds clear to auscultation       Cardiovascular hypertension, + Peripheral Vascular Disease   Rhythm:regular Rate:Normal     Neuro/Psych  Headaches, PSYCHIATRIC DISORDERS Anxiety Depression    GI/Hepatic GERD  ,  Endo/Other    Renal/GU      Musculoskeletal  (+) Arthritis ,   Abdominal   Peds  Hematology   Anesthesia Other Findings   Reproductive/Obstetrics                            Anesthesia Physical Anesthesia Plan  ASA: II  Anesthesia Plan: General   Post-op Pain Management:    Induction: Intravenous  PONV Risk Score and Plan: 3 and Ondansetron, Dexamethasone, Midazolam and Treatment may vary due to age or medical condition  Airway Management Planned: Oral ETT  Additional Equipment:   Intra-op Plan:   Post-operative Plan: Extubation in OR  Informed Consent: I have reviewed the patients History and Physical, chart, labs and discussed the procedure including the risks, benefits and alternatives for the proposed anesthesia with the patient or authorized representative who has indicated his/her understanding and acceptance.     Plan Discussed with: CRNA, Anesthesiologist and Surgeon  Anesthesia Plan Comments: (See my anesthesia note regarding Gilenya (increased risk of prolonged QT and AV block--normal intervals 06/2017). Myra Gianotti, PA-C. )       Anesthesia Quick Evaluation

## 2017-09-30 ENCOUNTER — Ambulatory Visit (HOSPITAL_COMMUNITY): Payer: Federal, State, Local not specified - PPO | Admitting: Certified Registered"

## 2017-09-30 ENCOUNTER — Encounter (HOSPITAL_COMMUNITY): Payer: Self-pay | Admitting: Certified Registered"

## 2017-09-30 ENCOUNTER — Ambulatory Visit (HOSPITAL_COMMUNITY): Payer: Federal, State, Local not specified - PPO

## 2017-09-30 ENCOUNTER — Ambulatory Visit (HOSPITAL_COMMUNITY): Payer: Federal, State, Local not specified - PPO | Admitting: Vascular Surgery

## 2017-09-30 ENCOUNTER — Encounter (HOSPITAL_COMMUNITY)
Admission: RE | Disposition: A | Discharge status: Home / Self Care | Payer: Self-pay | Source: Ambulatory Visit | Attending: Surgery

## 2017-09-30 ENCOUNTER — Ambulatory Visit (HOSPITAL_COMMUNITY)
Admission: RE | Admit: 2017-09-30 | Discharge: 2017-09-30 | Disposition: A | Discharge status: Home / Self Care | Payer: Federal, State, Local not specified - PPO | Source: Ambulatory Visit | Attending: Surgery | Admitting: Surgery

## 2017-09-30 DIAGNOSIS — K219 Gastro-esophageal reflux disease without esophagitis: Secondary | ICD-10-CM | POA: Diagnosis not present

## 2017-09-30 DIAGNOSIS — Z419 Encounter for procedure for purposes other than remedying health state, unspecified: Secondary | ICD-10-CM

## 2017-09-30 DIAGNOSIS — I10 Essential (primary) hypertension: Secondary | ICD-10-CM | POA: Diagnosis not present

## 2017-09-30 DIAGNOSIS — F17200 Nicotine dependence, unspecified, uncomplicated: Secondary | ICD-10-CM | POA: Diagnosis not present

## 2017-09-30 DIAGNOSIS — E7800 Pure hypercholesterolemia, unspecified: Secondary | ICD-10-CM | POA: Diagnosis not present

## 2017-09-30 DIAGNOSIS — F419 Anxiety disorder, unspecified: Secondary | ICD-10-CM | POA: Diagnosis not present

## 2017-09-30 DIAGNOSIS — Z79899 Other long term (current) drug therapy: Secondary | ICD-10-CM | POA: Insufficient documentation

## 2017-09-30 DIAGNOSIS — F329 Major depressive disorder, single episode, unspecified: Secondary | ICD-10-CM | POA: Diagnosis not present

## 2017-09-30 DIAGNOSIS — Z7982 Long term (current) use of aspirin: Secondary | ICD-10-CM | POA: Insufficient documentation

## 2017-09-30 DIAGNOSIS — K8010 Calculus of gallbladder with chronic cholecystitis without obstruction: Secondary | ICD-10-CM | POA: Insufficient documentation

## 2017-09-30 HISTORY — PX: CHOLECYSTECTOMY: SHX55

## 2017-09-30 SURGERY — LAPAROSCOPIC CHOLECYSTECTOMY WITH INTRAOPERATIVE CHOLANGIOGRAM
Anesthesia: General

## 2017-09-30 MED ORDER — PROMETHAZINE HCL 25 MG/ML IJ SOLN
INTRAMUSCULAR | Status: AC
  Filled 2017-09-30: qty 1

## 2017-09-30 MED ORDER — IBUPROFEN 800 MG PO TABS
800.0000 mg | ORAL_TABLET | Freq: Three times a day (TID) | ORAL | 0 refills | Status: DC | PRN
Start: 2017-09-30 — End: 2017-11-25

## 2017-09-30 MED ORDER — OXYCODONE HCL 5 MG PO TABS: 5.0000 mg | ORAL_TABLET | Freq: Four times a day (QID) | ORAL | 0 refills | Status: DC | PRN

## 2017-09-30 MED ORDER — SODIUM CHLORIDE 0.9 % IV SOLN
INTRAVENOUS | Status: DC | PRN
  Administered 2017-09-30: 15 mL

## 2017-09-30 MED ORDER — PROPOFOL 10 MG/ML IV BOLUS
INTRAVENOUS | Status: AC
  Filled 2017-09-30: qty 20

## 2017-09-30 MED ORDER — SUGAMMADEX SODIUM 200 MG/2ML IV SOLN
INTRAVENOUS | Status: DC | PRN
  Administered 2017-09-30: 200 mg via INTRAVENOUS

## 2017-09-30 MED ORDER — DEXAMETHASONE SODIUM PHOSPHATE 10 MG/ML IJ SOLN
INTRAMUSCULAR | Status: AC
  Filled 2017-09-30: qty 1

## 2017-09-30 MED ORDER — PROPOFOL 10 MG/ML IV BOLUS
INTRAVENOUS | Status: DC | PRN
  Administered 2017-09-30: 110 mg via INTRAVENOUS

## 2017-09-30 MED ORDER — LIDOCAINE 2% (20 MG/ML) 5 ML SYRINGE
INTRAMUSCULAR | Status: AC
  Filled 2017-09-30: qty 5

## 2017-09-30 MED ORDER — SODIUM CHLORIDE 0.9 % IR SOLN
Status: DC | PRN
  Administered 2017-09-30: 1000 mL

## 2017-09-30 MED ORDER — FENTANYL CITRATE (PF) 250 MCG/5ML IJ SOLN
INTRAMUSCULAR | Status: AC
  Filled 2017-09-30: qty 5

## 2017-09-30 MED ORDER — ONDANSETRON HCL 4 MG/2ML IJ SOLN
INTRAMUSCULAR | Status: AC
  Filled 2017-09-30: qty 2

## 2017-09-30 MED ORDER — DEXAMETHASONE SODIUM PHOSPHATE 10 MG/ML IJ SOLN
INTRAMUSCULAR | Status: DC | PRN
  Administered 2017-09-30: 6 mg via INTRAVENOUS

## 2017-09-30 MED ORDER — ACETAMINOPHEN 500 MG PO TABS
1000.0000 mg | ORAL_TABLET | ORAL | Status: AC
  Administered 2017-09-30: 1000 mg via ORAL

## 2017-09-30 MED ORDER — MIDAZOLAM HCL 5 MG/5ML IJ SOLN
INTRAMUSCULAR | Status: DC | PRN
  Administered 2017-09-30: 2 mg via INTRAVENOUS

## 2017-09-30 MED ORDER — ONDANSETRON HCL 4 MG/2ML IJ SOLN
INTRAMUSCULAR | Status: DC | PRN
  Administered 2017-09-30: 4 mg via INTRAVENOUS

## 2017-09-30 MED ORDER — 0.9 % SODIUM CHLORIDE (POUR BTL) OPTIME
TOPICAL | Status: DC | PRN
  Administered 2017-09-30: 1000 mL

## 2017-09-30 MED ORDER — PROMETHAZINE HCL 25 MG/ML IJ SOLN
6.2500 mg | Freq: Once | INTRAMUSCULAR | Status: AC
  Administered 2017-09-30: 6.25 mg via INTRAVENOUS

## 2017-09-30 MED ORDER — BUPIVACAINE-EPINEPHRINE 0.5% -1:200000 IJ SOLN
INTRAMUSCULAR | Status: DC | PRN
  Administered 2017-09-30: 7 mL

## 2017-09-30 MED ORDER — CHLORHEXIDINE GLUCONATE CLOTH 2 % EX PADS: 6.0000 | MEDICATED_PAD | Freq: Once | CUTANEOUS | Status: DC

## 2017-09-30 MED ORDER — HYDROMORPHONE HCL 1 MG/ML IJ SOLN
INTRAMUSCULAR | Status: AC
  Administered 2017-09-30: 0.25 mg via INTRAVENOUS
  Filled 2017-09-30: qty 1

## 2017-09-30 MED ORDER — ACETAMINOPHEN 500 MG PO TABS
ORAL_TABLET | ORAL | Status: AC
  Filled 2017-09-30: qty 2

## 2017-09-30 MED ORDER — ROCURONIUM BROMIDE 10 MG/ML (PF) SYRINGE
PREFILLED_SYRINGE | INTRAVENOUS | Status: AC
  Filled 2017-09-30: qty 5

## 2017-09-30 MED ORDER — LIDOCAINE 2% (20 MG/ML) 5 ML SYRINGE
INTRAMUSCULAR | Status: DC | PRN
  Administered 2017-09-30: 100 mg via INTRAVENOUS

## 2017-09-30 MED ORDER — SCOPOLAMINE 1 MG/3DAYS TD PT72
MEDICATED_PATCH | TRANSDERMAL | Status: AC
  Filled 2017-09-30: qty 1

## 2017-09-30 MED ORDER — IOPAMIDOL (ISOVUE-300) INJECTION 61%
INTRAVENOUS | Status: AC
  Filled 2017-09-30: qty 50

## 2017-09-30 MED ORDER — OXYCODONE HCL 5 MG/5ML PO SOLN: 5.0000 mg | Freq: Once | ORAL | Status: DC | PRN

## 2017-09-30 MED ORDER — FENTANYL CITRATE (PF) 250 MCG/5ML IJ SOLN
INTRAMUSCULAR | Status: DC | PRN
  Administered 2017-09-30 (×2): 50 ug via INTRAVENOUS
  Administered 2017-09-30: 150 ug via INTRAVENOUS
  Administered 2017-09-30 (×2): 50 ug via INTRAVENOUS

## 2017-09-30 MED ORDER — LACTATED RINGERS IV SOLN
INTRAVENOUS | Status: DC
  Administered 2017-09-30: 50 mL/h via INTRAVENOUS
  Administered 2017-09-30: 17:00:00 via INTRAVENOUS

## 2017-09-30 MED ORDER — HYDROMORPHONE HCL 1 MG/ML IJ SOLN
0.2500 mg | INTRAMUSCULAR | Status: DC | PRN
  Administered 2017-09-30 (×2): 0.25 mg via INTRAVENOUS

## 2017-09-30 MED ORDER — ONDANSETRON HCL 4 MG PO TABS: 4.0000 mg | ORAL_TABLET | Freq: Three times a day (TID) | ORAL | 0 refills | Status: DC | PRN

## 2017-09-30 MED ORDER — BUPIVACAINE-EPINEPHRINE (PF) 0.5% -1:200000 IJ SOLN
INTRAMUSCULAR | Status: AC
  Filled 2017-09-30: qty 30

## 2017-09-30 MED ORDER — SUGAMMADEX SODIUM 200 MG/2ML IV SOLN
INTRAVENOUS | Status: AC
  Filled 2017-09-30: qty 2

## 2017-09-30 MED ORDER — ONDANSETRON HCL 4 MG/2ML IJ SOLN: 4.0000 mg | Freq: Four times a day (QID) | INTRAMUSCULAR | Status: DC | PRN

## 2017-09-30 MED ORDER — MIDAZOLAM HCL 2 MG/2ML IJ SOLN
INTRAMUSCULAR | Status: AC
  Filled 2017-09-30: qty 2

## 2017-09-30 MED ORDER — ROCURONIUM BROMIDE 10 MG/ML (PF) SYRINGE
PREFILLED_SYRINGE | INTRAVENOUS | Status: DC | PRN
  Administered 2017-09-30: 50 mg via INTRAVENOUS

## 2017-09-30 MED ORDER — OXYCODONE HCL 5 MG PO TABS: 5.0000 mg | ORAL_TABLET | Freq: Once | ORAL | Status: DC | PRN

## 2017-09-30 MED ORDER — SCOPOLAMINE 1 MG/3DAYS TD PT72
MEDICATED_PATCH | TRANSDERMAL | Status: DC | PRN
  Administered 2017-09-30: 1 via TRANSDERMAL

## 2017-09-30 SURGICAL SUPPLY — 42 items
ADH SKN CLS APL DERMABOND .7 (GAUZE/BANDAGES/DRESSINGS) ×1
APPLIER CLIP ROT 10 11.4 M/L (STAPLE) ×2
APR CLP MED LRG 11.4X10 (STAPLE) ×1
BAG SPEC RTRVL LRG 6X4 10 (ENDOMECHANICALS) ×1
BLADE CLIPPER SURG (BLADE) IMPLANT
CANISTER SUCT 3000ML PPV (MISCELLANEOUS) ×2 IMPLANT
CHLORAPREP W/TINT 26ML (MISCELLANEOUS) ×2 IMPLANT
CLIP APPLIE ROT 10 11.4 M/L (STAPLE) ×1 IMPLANT
COVER MAYO STAND STRL (DRAPES) ×2 IMPLANT
COVER SURGICAL LIGHT HANDLE (MISCELLANEOUS) ×2 IMPLANT
DERMABOND ADVANCED (GAUZE/BANDAGES/DRESSINGS) ×1
DERMABOND ADVANCED .7 DNX12 (GAUZE/BANDAGES/DRESSINGS) ×1 IMPLANT
DRAPE C-ARM 42X72 X-RAY (DRAPES) ×2 IMPLANT
DRAPE WARM FLUID 44X44 (DRAPE) ×1 IMPLANT
ELECT REM PT RETURN 9FT ADLT (ELECTROSURGICAL) ×2
ELECTRODE REM PT RTRN 9FT ADLT (ELECTROSURGICAL) ×1 IMPLANT
GLOVE BIO SURGEON STRL SZ8 (GLOVE) ×2 IMPLANT
GLOVE BIOGEL PI IND STRL 8 (GLOVE) ×1 IMPLANT
GLOVE BIOGEL PI INDICATOR 8 (GLOVE) ×1
GOWN STRL REUS W/ TWL LRG LVL3 (GOWN DISPOSABLE) ×2 IMPLANT
GOWN STRL REUS W/ TWL XL LVL3 (GOWN DISPOSABLE) ×1 IMPLANT
GOWN STRL REUS W/TWL LRG LVL3 (GOWN DISPOSABLE) ×6
GOWN STRL REUS W/TWL XL LVL3 (GOWN DISPOSABLE) ×2
KIT BASIN OR (CUSTOM PROCEDURE TRAY) ×2 IMPLANT
KIT ROOM TURNOVER OR (KITS) ×2 IMPLANT
NS IRRIG 1000ML POUR BTL (IV SOLUTION) ×2 IMPLANT
PAD ARMBOARD 7.5X6 YLW CONV (MISCELLANEOUS) ×2 IMPLANT
POUCH SPECIMEN RETRIEVAL 10MM (ENDOMECHANICALS) ×2 IMPLANT
SCISSORS LAP 5X35 DISP (ENDOMECHANICALS) ×2 IMPLANT
SET CHOLANGIOGRAPH 5 50 .035 (SET/KITS/TRAYS/PACK) ×2 IMPLANT
SET IRRIG TUBING LAPAROSCOPIC (IRRIGATION / IRRIGATOR) ×2 IMPLANT
SLEEVE ENDOPATH XCEL 5M (ENDOMECHANICALS) ×2 IMPLANT
SPECIMEN JAR SMALL (MISCELLANEOUS) ×2 IMPLANT
SUT MNCRL AB 4-0 PS2 18 (SUTURE) ×2 IMPLANT
TOWEL OR 17X24 6PK STRL BLUE (TOWEL DISPOSABLE) ×2 IMPLANT
TOWEL OR 17X26 10 PK STRL BLUE (TOWEL DISPOSABLE) ×2 IMPLANT
TRAY LAPAROSCOPIC MC (CUSTOM PROCEDURE TRAY) ×2 IMPLANT
TROCAR XCEL BLUNT TIP 100MML (ENDOMECHANICALS) ×2 IMPLANT
TROCAR XCEL NON-BLD 11X100MML (ENDOMECHANICALS) ×2 IMPLANT
TROCAR XCEL NON-BLD 5MMX100MML (ENDOMECHANICALS) ×2 IMPLANT
TUBING INSUFFLATION (TUBING) ×2 IMPLANT
WATER STERILE IRR 1000ML POUR (IV SOLUTION) ×2 IMPLANT

## 2017-09-30 NOTE — Transfer of Care (Signed)
Immediate Anesthesia Transfer of Care Note  Patient: Crystal Simon  Procedure(s) Performed: LAPAROSCOPIC CHOLECYSTECTOMY WITH INTRAOPERATIVE CHOLANGIOGRAM (N/A )  Patient Location: PACU  Anesthesia Type:General  Level of Consciousness: awake, alert , oriented and patient cooperative  Airway & Oxygen Therapy: Patient Spontanous Breathing and Patient connected to nasal cannula oxygen  Post-op Assessment: Report given to RN, Post -op Vital signs reviewed and stable and Patient moving all extremities  Post vital signs: Reviewed and stable  Last Vitals:  Vitals:   09/30/17 1319 09/30/17 1336  BP:  (!) 145/75  Pulse: 67   Resp: 18   Temp: 37.1 C   SpO2: 100%     Last Pain:  Vitals:   09/30/17 1331  TempSrc:   PainSc: 0-No pain      Patients Stated Pain Goal: 6 (75/10/25 8527)  Complications: No apparent anesthesia complications

## 2017-09-30 NOTE — Op Note (Signed)
Laparoscopic Cholecystectomy with IOC Procedure Note  Indications: This patient presents with symptomatic gallbladder disease and will undergo laparoscopic cholecystectomy.  Pre-operative Diagnosis: Calculus of gallbladder without mention of cholecystitis or obstruction  Post-operative Diagnosis: Same  Surgeon: Joyice Faster Daion Ginsberg   Assistants: Deon Pilling RNFA  Anesthesia: General endotracheal anesthesia and Local anesthesia 0.25.% bupivacaine, with epinephrine  ASA Class: 2  Procedure Details  The patient was seen again in the Holding Room. The risks, benefits, complications, treatment options, and expected outcomes were discussed with the patient. The possibilities of reaction to medication, pulmonary aspiration, perforation of viscus, bleeding, recurrent infection, finding a normal gallbladder, the need for additional procedures, failure to diagnose a condition, the possible need to convert to an open procedure, and creating a complication requiring transfusion or operation were discussed with the patient. The patient and/or family concurred with the proposed plan, giving informed consent. The site of surgery properly noted/marked. The patient was taken to Operating Room, identified as Crystal Simon and the procedure verified as Laparoscopic Cholecystectomy with Intraoperative Cholangiograms. A Time Out was held and the above information confirmed.  Prior to the induction of general anesthesia, antibiotic prophylaxis was administered. General endotracheal anesthesia was then administered and tolerated well. After the induction, the abdomen was prepped in the usual sterile fashion. The patient was positioned in the supine position with the left arm comfortably tucked, along with some reverse Trendelenburg.  Local anesthetic agent was injected into the skin near the umbilicus and an incision made. The midline fascia was incised and the Hasson technique was used to introduce a 12 mm port under direct  vision. It was secured with a figure of eight Vicryl suture placed in the usual fashion. Pneumoperitoneum was then created with CO2 and tolerated well without any adverse changes in the patient's vital signs. Additional trocars were introduced under direct vision with an 11 mm trocar in the epigastrium and two    5 mm trocars in the right upper quadrant. All skin incisions were infiltrated with a local anesthetic agent before making the incision and placing the trocars.   The gallbladder was identified, the fundus grasped and retracted cephalad. Adhesions were lysed bluntly and with the electrocautery where indicated, taking care not to injure any adjacent organs or viscus. The infundibulum was grasped and retracted laterally, exposing the peritoneum overlying the triangle of Calot. This was then divided and exposed in a blunt fashion. The cystic duct was clearly identified and bluntly dissected circumferentially. The junctions of the gallbladder, cystic duct and common bile duct were clearly identified prior to the division of any linear structure.   An incision was made in the cystic duct and the cholangiogram catheter introduced. The catheter was secured using an endoclip. The study showed no stones and good visualization of the distal and proximal biliary tree. The catheter was then removed.   The cystic duct was then  ligated with surgical clips  on the patient side and  clipped on the gallbladder side and divided. The cystic artery was identified, dissected free, ligated with clips and divided as well. Posterior cystic artery clipped and divided.  The gallbladder was dissected from the liver bed in retrograde fashion with the electrocautery. The gallbladder was removed. The liver bed was irrigated and inspected. Hemostasis was achieved with the electrocautery. Copious irrigation was utilized and was repeatedly aspirated until clear all particulate matter. Hemostasis was achieved with no signs of  bleeding or bile leakage.  Pneumoperitoneum was completely reduced after viewing removal of the  trocars under direct vision. The wound was thoroughly irrigated and the fascia was then closed with a figure of eight suture; the skin was then closed with 4 O MONOCRYL  and a sterile dressing was applied of Dermabond. .  Instrument, sponge, and needle counts were correct at closure and at the conclusion of the case.   Findings: Cholecystitis with Cholelithiasis  Estimated Blood Loss: less than 50 mL         Drains: none         Total IV Fluids: per anesthesia          Specimens: Gallbladder           Complications: None; patient tolerated the procedure well.         Disposition: PACU - hemodynamically stable.         Condition: stable

## 2017-09-30 NOTE — Anesthesia Procedure Notes (Signed)
Procedure Name: Intubation Date/Time: 09/30/2017 3:10 PM Performed by: Myna Bright, CRNA Pre-anesthesia Checklist: Patient identified, Suction available, Emergency Drugs available and Patient being monitored Patient Re-evaluated:Patient Re-evaluated prior to induction Oxygen Delivery Method: Circle system utilized Preoxygenation: Pre-oxygenation with 100% oxygen Induction Type: IV induction Ventilation: Mask ventilation without difficulty Laryngoscope Size: Mac and 3 Grade View: Grade I Tube type: Oral Tube size: 7.0 mm Number of attempts: 1 Airway Equipment and Method: Stylet Placement Confirmation: ETT inserted through vocal cords under direct vision,  positive ETCO2 and breath sounds checked- equal and bilateral Secured at: 21 cm Tube secured with: Tape Dental Injury: Teeth and Oropharynx as per pre-operative assessment

## 2017-09-30 NOTE — Interval H&P Note (Signed)
History and Physical Interval Note:  09/30/2017 1:30 PM  Crystal Simon  has presented today for surgery, with the diagnosis of gallstones  The various methods of treatment have been discussed with the patient and family. After consideration of risks, benefits and other options for treatment, the patient has consented to  Procedure(s): LAPAROSCOPIC CHOLECYSTECTOMY WITH INTRAOPERATIVE CHOLANGIOGRAM (N/A) as a surgical intervention .  The patient's history has been reviewed, patient examined, no change in status, stable for surgery.  I have reviewed the patient's chart and labs.  Questions were answered to the patient's satisfaction.     [place]

## 2017-09-30 NOTE — H&P (Signed)
Crystal Simon Documented:  Location: [facility] Patient #: [id number] DOB: March 27, 1966 Married / Language: English / Race: White Female  History of Present Illness Marcello Moores A. Arlando Leisinger MD; Patient words: Patient presents at the request of Crystal Savoy, NP for a multiply history of chronic epigastric abdominal pain. She has a history of IBS and has been worked up extensively with endoscopy. She is found to have a gallstone on recent ultrasound. She is epigastric and right upper quadrant pain which radiates to her back. This is chronic and comes and goes. It is moderate intensity without exacerbating features. She is very specific with her diet and secondary to her multiple sclerosis has to eat at night for her medication. She thinks this may make it worse. There is no specific food that makes it worse.       CLINICAL DATA: Abdominal pain for 5 months. Rectal bleeding.  EXAM: ULTRASOUND ABDOMEN LIMITED RIGHT UPPER QUADRANT  COMPARISON: None.  FINDINGS: Gallbladder:  There is a 1.4 cm stone in the gallbladder with no wall thickening, pericholecystic fluid, or Murphy's sign.  Common bile duct:  Diameter: 2.6 mm  Liver:  No focal lesion identified. Within normal limits in parenchymal echogenicity. Portal vein is patent on color Doppler imaging with normal direction of blood flow towards the liver.  IMPRESSION: 1. Cholelithiasis. 2. No other abnormalities identified.   Electronically Signed By: Dorise Bullion III M.D On: 07/23/2017 08:58.  The patient is a 52 year old female.   Past Surgical History (Crystal Simon, [place]; 08/24/2017 10:48 AM) Hemorrhoidectomy Hysterectomy (not due to cancer) - Partial Oral Surgery Shoulder Surgery Left.  Diagnostic Studies History (Crystal Simon, [place];Colonoscopy 1-5 years ago Mammogram 1-3 years ago Pap Smear 1-5 years ago  Allergies (Crystal Simon, [place]; Morphine  Sulfate (PF) *ANALGESICS - OPIOID* OxyCODONE HCl *ANALGESICS - OPIOID* PredniSONE *CORTICOSTEROIDS* Allergies Reconciled  Medication History (Crystal A. Brown, RMA; 73M) ALPRAZolam (0.5MG  Tablet, Oral) Active. Aspirin (81MG  Tablet, Oral) Active. Biotin (5MG  Capsule, Oral) Active. Bystolic (5MG  Tablet, Oral) Active. Vitamin D (Cholecalciferol) (Oral) Specific strength unknown - Active. Cimetidine (200MG  Tablet, Oral) Active. Lexapro (10MG  Tablet, Oral) Active. Neurontin (300MG  Capsule, Oral) Active. Garlic (1000MG  Capsule, Oral) Active. Gilenya (0.5MG  Capsule, Oral) Active. Hydrocortisone (2.5% Cream, External) Active. Ibuprin (200MG  Tablet, Oral) Active. Melatonin (10MG  Tablet, Oral) Active. Omega 3 (1000MG  Capsule, Oral) Active. Omeprazole (40MG  Capsule DR, Oral) Active. Zantac (150MG  Tablet, Oral) Active. Valtrex (500MG  Tablet, Oral) Active. Vitamin B-12 (1000MCG Tablet, Oral) Active. Medications Reconciled  Social History (Crystal Simon, [place];  Alcohol use Remotely quit alcohol use. Caffeine use Carbonated beverages, Coffee, Tea. No drug use Tobacco use Current every day smoker.  Family History (Crystal Simon, [place];  Alcohol Abuse Brother. Arthritis Mother. Colon Polyps Father, Mother. Depression Mother. Diabetes Mellitus Mother. Heart Disease Family Members In General. Hypertension Family Members In General, Mother. Melanoma Family Members In General.  Pregnancy / Birth History (Crystal Simon, [place]; Age of menopause 83-50 Gravida 3 Maternal age 16-20 Para 3  Other Problems (Crystal Simon, [place];  Anxiety Disorder Arthritis Back Pain Chest pain Cholelithiasis Depression Gastroesophageal Reflux Disease General anesthesia - complications Hemorrhoids High blood pressure Hypercholesterolemia Other disease, cancer, significant illness     Review of Systems (Crystal Simon RMA;  General  Present- Appetite Loss, Chills, Fatigue and Weight Gain. Not Present- Fever, Night Sweats and Weight Loss. Skin Not Present- Change in Wart/Mole, Dryness, Hives, Jaundice, New Lesions, Non-Healing Wounds, Rash and Ulcer. HEENT Present- Oral Ulcers,  Seasonal Allergies, Sinus Pain and Visual Disturbances. Not Present- Earache, Hearing Loss, Hoarseness, Nose Bleed, Ringing in the Ears, Sore Throat, Wears glasses/contact lenses and Yellow Eyes. Respiratory Present- Snoring. Not Present- Bloody sputum, Chronic Cough, Difficulty Breathing and Wheezing. Cardiovascular Present- Chest Pain, Palpitations, Rapid Heart Rate and Shortness of Breath. Not Present- Difficulty Breathing Lying Down, Leg Cramps and Swelling of Extremities. Gastrointestinal Present- Abdominal Pain, Bloating, Change in Bowel Habits, Chronic diarrhea, Constipation, Difficulty Swallowing, Gets full quickly at meals, Hemorrhoids, Indigestion and Nausea. Not Present- Bloody Stool, Excessive gas, Rectal Pain and Vomiting. Female Genitourinary Present- Pelvic Pain. Not Present- Frequency, Nocturia, Painful Urination and Urgency. Musculoskeletal Present- Back Pain, Joint Pain, Joint Stiffness, Muscle Pain and Muscle Weakness. Not Present- Swelling of Extremities. Neurological Present- Headaches, Numbness, Tingling, Trouble walking and Weakness. Not Present- Decreased Memory, Fainting, Seizures and Tremor. Psychiatric Present- Anxiety and Depression. Not Present- Bipolar, Change in Sleep Pattern, Fearful and Frequent crying. Endocrine Present- Heat Intolerance and Hot flashes. Not Present- Cold Intolerance, Excessive Hunger, Hair Changes and New Diabetes. Hematology Not Present- Blood Thinners, Easy Bruising, Excessive bleeding, Gland problems, HIV and Persistent Infections.  Vitals (Crystal A. Brown RMA; 08/24/2017 10:49 AM) 08/24/2017 10:49 AM Weight: 213.5 lb Height: 69in Body Surface Area: 2.12 m Body Mass Index: 31.53 kg/m   Temp.: 97.41F  Pulse: 66 (Regular)  BP: 128/84 (Sitting, Left Arm, Standard)      Physical Exam (Aliviya Schoeller A. Kanyon Seibold MD;   General Mental Status-Alert. General Appearance-Consistent with stated age. Hydration-Well hydrated. Voice-Normal.  Head and Neck Head-normocephalic, atraumatic with no lesions or palpable masses.  Chest and Lung Exam Chest and lung exam reveals -quiet, even and easy respiratory effort with no use of accessory muscles and on auscultation, normal breath sounds, no adventitious sounds and normal vocal resonance. Inspection Chest Wall - Normal. Back - normal.  Cardiovascular Cardiovascular examination reveals -on palpation PMI is normal in location and amplitude, no palpable S3 or S4. Normal cardiac borders., normal heart sounds, regular rate and rhythm with no murmurs, carotid auscultation reveals no bruits and normal pedal pulses bilaterally.  Abdomen Inspection Inspection of the abdomen reveals - No Hernias. Skin - Scar - no surgical scars. Palpation/Percussion Palpation and Percussion of the abdomen reveal - Soft, Non Tender, No Rebound tenderness, No Rigidity (guarding) and No hepatosplenomegaly. Auscultation Auscultation of the abdomen reveals - Bowel sounds normal.  Neurologic Note: Walks with cane secondary to MS. He is able to get on and off the examination table without difficulty.    Assessment & Plan (Kaydie Petsch A. Shambria Camerer MD;  SYMPTOMATIC CHOLELITHIASIS (K80.20) Impression: Discussed options of cholecystectomy with the patient. Risks, benefits and other options discussed. Medical management discussed. The pros and cons of surgery and possibility of complication discussed.      The procedure has been discussed with the patient. Risks of laparoscopic cholecystectomy include bleeding, infection, bile duct injury, leak, death, open surgery, diarrhea, other surgery, organ injury, blood vessel injury, DVT,  and additional care.  Current Plans Pt Education - Laparoscopic Cholecystectomy: gallbladder Pt Education - [facility] Laparosopic Post Op [facility] ([place]) The anatomy & physiology of hepatobiliary & pancreatic function was discussed. The pathophysiology of gallbladder dysfunction was discussed. Natural history risks without surgery was discussed. I feel the risks of no intervention will lead to serious problems that outweigh the operative risks; therefore, I recommended cholecystectomy to remove the pathology. I explained laparoscopic techniques with possible need for an open approach. Probable cholangiogram to evaluate the bilary tract was explained as well.  Risks such as bleeding, infection, abscess, leak, injury to other organs, need for further treatment, heart attack, death, and other risks were discussed. I noted a good likelihood this will help address the problem. Possibility that this will not correct all abdominal symptoms was explained. Goals of post-operative recovery were discussed as well. We will work to minimize complications. An educational handout further explaining the pathology and treatment options was given as well. Questions were answered. The patient expresses understanding & wishes to proceed with surgery.  Pt Education - [place] ([place]) Written instructions provided

## 2017-09-30 NOTE — Discharge Instructions (Signed)
[facility] ______[facility] °LAPAROSCOPIC SURGERY: POST OP INSTRUCTIONS °Always review your discharge instruction sheet given to you by the facility where your surgery was performed. °IF YOU HAVE DISABILITY OR FAMILY LEAVE FORMS, YOU MUST BRING THEM TO THE OFFICE FOR PROCESSING.   °DO NOT GIVE THEM TO YOUR DOCTOR. ° °1. A prescription for pain medication may be given to you upon discharge.  Take your pain medication as prescribed, if needed.  If narcotic pain medicine is not needed, then you may take acetaminophen (Tylenol) or ibuprofen (Advil) as needed. °2. Take your usually prescribed medications unless otherwise directed. °3. If you need a refill on your pain medication, please contact your pharmacy.  They will contact our office to request authorization. Prescriptions will not be filled after 5pm or on week-ends. °4. You should follow a light diet the first few days after arrival home, such as soup and crackers, etc.  Be sure to include lots of fluids daily. °5. Most patients will experience some swelling and bruising in the area of the incisions.  Ice packs will help.  Swelling and bruising can take several days to resolve.  °6. It is common to experience some constipation if taking pain medication after surgery.  Increasing fluid intake and taking a stool softener (such as Colace) will usually help or prevent this problem from occurring.  A mild laxative (Milk of Magnesia or Miralax) should be taken according to package instructions if there are no bowel movements after 48 hours. °7. Unless discharge instructions indicate otherwise, you may remove your bandages 24-48 hours after surgery, and you may shower at that time.  You may have steri-strips (small skin tapes) in place directly over the incision.  These strips should be left on the skin for 7-10 days.  If your surgeon used skin glue on the incision, you may shower in 24 hours.  The glue will flake off over the next 2-3 weeks.  Any sutures or  staples will be removed at the office during your follow-up visit. °8. ACTIVITIES:  You may resume regular (light) daily activities beginning the next day--such as daily self-care, walking, climbing stairs--gradually increasing activities as tolerated.  You may have sexual intercourse when it is comfortable.  Refrain from any heavy lifting or straining until approved by your doctor. °a. You may drive when you are no longer taking prescription pain medication, you can comfortably wear a seatbelt, and you can safely maneuver your car and apply brakes. °b. RETURN TO WORK:  __________________________________________________________ °9. You should see your doctor in the office for a follow-up appointment approximately 2-3 weeks after your surgery.  Make sure that you call for this appointment within a day or two after you arrive home to insure a convenient appointment time. °10. OTHER INSTRUCTIONS: __________________________________________________________________________________________________________________________ __________________________________________________________________________________________________________________________ °WHEN TO CALL YOUR DOCTOR: °1. Fever over 101.0 °2. Inability to urinate °3. Continued bleeding from incision. °4. Increased pain, redness, or drainage from the incision. °5. Increasing abdominal pain ° °The clinic staff is available to answer your questions during regular business hours.  Please don’t hesitate to call and ask to speak to one of the nurses for clinical concerns.  If you have a medical emergency, go to the nearest emergency room or call 911.  A surgeon from [facility] is always on call at the hospital. °[address], [address], [place], [place]  [address] ? [address] [address], [place], [place]   [address] °[phone] ? [phone] ? FAX [phone] °Web site:   [email or web address] °

## 2017-09-30 NOTE — Anesthesia Postprocedure Evaluation (Signed)
Anesthesia Post Note  Patient: Crystal Simon  Procedure(s) Performed: LAPAROSCOPIC CHOLECYSTECTOMY WITH INTRAOPERATIVE CHOLANGIOGRAM (N/A )     Patient location during evaluation: PACU Anesthesia Type: General Level of consciousness: awake and alert Pain management: pain level controlled Vital Signs Assessment: post-procedure vital signs reviewed and stable Respiratory status: spontaneous breathing, nonlabored ventilation, respiratory function stable and patient connected to nasal cannula oxygen Cardiovascular status: blood pressure returned to baseline and stable Postop Assessment: no apparent nausea or vomiting Anesthetic complications: no    Last Vitals:  Vitals:   09/30/17 1319 09/30/17 1336  BP:  (!) 145/75  Pulse: 67   Resp: 18   Temp: 37.1 C   SpO2: 100%     Last Pain:  Vitals:   09/30/17 1331  TempSrc:   PainSc: 0-No pain                 Janira Mandell DAVID

## 2017-10-01 ENCOUNTER — Encounter (HOSPITAL_COMMUNITY): Payer: Self-pay | Admitting: Surgery

## 2017-10-16 ENCOUNTER — Other Ambulatory Visit: Payer: Self-pay | Admitting: Neurology

## 2017-10-21 NOTE — Progress Notes (Addendum)
Pt LM on my VM that she had rcvd a letter from [employer] stating the PA for her [place] will end 11/05/17, and for me to call [phone] to request fax form to renew PA. [employer], spoke with [place], gave clinicals verbally, rcvd PA valid through 10/09/18

## 2017-10-23 ENCOUNTER — Other Ambulatory Visit: Payer: Federal, State, Local not specified - PPO

## 2017-10-23 ENCOUNTER — Ambulatory Visit: Payer: Federal, State, Local not specified - PPO | Admitting: Neurology

## 2017-10-23 ENCOUNTER — Encounter: Payer: Self-pay | Admitting: Neurology

## 2017-10-23 VITALS — BP 136/92 | HR 80 | Ht 69.0 in | Wt 216.2 lb

## 2017-10-23 DIAGNOSIS — R519 Headache, unspecified: Secondary | ICD-10-CM

## 2017-10-23 DIAGNOSIS — G35 Multiple sclerosis: Secondary | ICD-10-CM

## 2017-10-23 DIAGNOSIS — R51 Headache: Secondary | ICD-10-CM | POA: Diagnosis not present

## 2017-10-23 NOTE — Patient Instructions (Signed)
1.  We will check MRI of brain and cervical spine with and without contrast. 2.  We will check hepatic function panel and vitamin D level 3.  Follow up in 6 months.  Prior to follow up, we will check CBC with diff, hepatic function panel and vitamin D level

## 2017-10-23 NOTE — Progress Notes (Signed)
NEUROLOGY FOLLOW UP OFFICE NOTE  Crystal Simon [id number]  HISTORY OF PRESENT ILLNESS: Crystal Simon is a 52 year old right-handed female who follows up to discuss management for MS.     UPDATE: MRI of brain with and without contrast from 04/14/17 demonstrated punctate focus of enhancement in the location of previous left midbrain lesion, of uncertain etiology and significance.  Pineal cyst measures 7 mm.  She is taking Gilenya.  She is also taking gabapentin 600mg  at bedtime, D3 5000 IU daily and B12 106mcg daily.   For epigastric pain, she was found to have gallstone on ultrasound.  Labs from 09/24/17 include:  CBC with WBC 4.4, HGB 14.3, HCT 43.0, PLT 266, ALC 0.3; hepatic panel with total bili 1.3, ALP 96, AST 52 and ALT 69.  She underwent cholecystectomy on 09/30/17.  She is feeling better.  Over the past several months, blood pressure has increased.  Her cardiologist, Dr. Donnetta Hutching, started her on Bystolic, which has helped.  However, she says the blood pressure has slowly been creeping up again.  She reports some headaches, a shooting pain radiating from the back of the head to the eye, usually right sided but also left sided as well.  It lasts only a few seconds and occurs two to three times a week.  She reports some neck pain.   Vision:  Increased prescription in the right eye.  Repeat ophthalmologic exam by Dr. Kathlen Mody on 09/22/17 revealed no macular edema. Weakness:  No Sensory:  Chronic sensory deficits Gait:  Chronic disability with cane, unchanged. Bowel/bladder:  No Mood:  Notes some depression.  Her husband was diagnosed with cancer, which has been difficult. Cognition:  Okay.    HISTORY: Initial symptom in 2012 presented with 1 week episode of vertigo with feeling off-balance.  This was followed by intermittent numbness and tingling of her hands and feet.  In November 2014, she developed double vision associated with slurred speech, right sided numbness and difficulty  swallowing (the right side of her mouth, tongue and throat felt numb).  MRI of the brain with and without contrast from 07/12/13 was personally reviewed and showed hyperintense T2 lesions in the on left side of the midbrain.  MRI of cervical spine from 08/02/13 was personally reviewed and was negative for demyelinating lesions.  She underwent a lumbar puncture in January 2015.  CSF cell count was 1, protein 33, glucose 56, 3 oligoclonal bands (not present in serum), negative myelin basic protein, IgG index 0.52.  NMO antibody was negative.   Past disease modifying medication:  Tecfidera (effective but caused significant GERD).   Repeat MRI of the brain with and without contrast from 01/18/14 was reportedly normal with resolution of the previous midbrain lesion (report available but not images).  Most recent MRI of brain with and without contrast from 10/09/15 was reportedly again unremarkable and showed no significant white matter disease.  Known 6 mm pineal cyst was noted.   At baseline, she had paresthesias of the right side of her face and throat.  She also has paresthesias in the upper extremities and toes, worse on the right.  Sometimes she needs to use a cane. Heat (such as during the summer months), stress and lack of sleep exacerbate chronic symptoms.  She also has restless leg syndrome.  PAST MEDICAL HISTORY: Past Medical History:  Diagnosis Date  . Anxiety   . Arthritis   . Bulging discs 1998   cervical  . Complication of anesthesia   .  Constipation   . Depression   . Dysrhythmia   . Floppy mitral valve    dx 30 yrs ago  . GERD (gastroesophageal reflux disease)   . [name] syndrome since late teens  . Headache(784.0)   . Heart palpitations   . Hypertension   . MS (multiple sclerosis) ([place])   . PONV (postoperative nausea and vomiting)    last couple surgeries, she has done ok    MEDICATIONS: Current Outpatient Medications on File Prior to Visit  Medication Sig Dispense Refill    . ALPRAZolam (XANAX) 0.5 MG tablet TAKE 1 TABLET BY MOUTH ONCE (1) DAILY AT BEDTIME AS NEEDED FOR ANXIETY 30 tablet 2  . Biotin 5000 MCG TABS Take 1 tablet by mouth daily.     Marland Kitchen BYSTOLIC 5 MG tablet Take 1 tablet daily by mouth.  3  . Cholecalciferol (VITAMIN D-3) 5000 units TABS Take 1 tablet by mouth daily.     Marland Kitchen escitalopram (LEXAPRO) 10 MG tablet TAKE 1 TABLET BY MOUTH ONCE (1) DAILY 30 tablet 2  . escitalopram (LEXAPRO) 10 MG tablet TAKE 1 TABLET BY MOUTH ONCE (1) DAILY 30 tablet 5  . Fingolimod HCl (GILENYA) 0.5 MG CAPS Take 1 capsule (0.5 mg total) by mouth daily. 90 capsule 3  . gabapentin (NEURONTIN) 300 MG capsule Take 2 capsules (600 mg total) at bedtime by mouth. 120 capsule 6  . Garlic 7062 MG CAPS Take 1 capsule by mouth daily.     Marland Kitchen ibuprofen (ADVIL,MOTRIN) 200 MG tablet Take 200 mg by mouth every 6 (six) hours as needed.    Marland Kitchen ibuprofen (ADVIL,MOTRIN) 800 MG tablet Take 1 tablet (800 mg total) by mouth every 8 (eight) hours as needed. 30 tablet 0  . Ibuprofen-Diphenhydramine Cit (ADVIL PM PO) Take 1 tablet by mouth at bedtime.     . Melatonin 10 MG TABS Take 1 tablet by mouth daily.     . Omega-3 Fatty Acids (FISH OIL) 1000 MG CAPS Take 1 capsule by mouth daily.     Marland Kitchen omeprazole (PRILOSEC) 40 MG capsule Take 1 capsule (40 mg total) daily before breakfast by mouth. 30 capsule 3  . ondansetron (ZOFRAN) 4 MG tablet Take 1 tablet (4 mg total) by mouth every 8 (eight) hours as needed for nausea or vomiting. 20 tablet 0  . oxyCODONE (OXY IR/ROXICODONE) 5 MG immediate release tablet Take 1-2 tablets (5-10 mg total) by mouth every 6 (six) hours as needed for severe pain. 10 tablet 0  . ranitidine (ZANTAC) 150 MG tablet Take 1 tablet (150 mg total) at bedtime by mouth. 30 tablet 3  . valACYclovir (VALTREX) 500 MG tablet Take 500 mg by mouth daily as needed (cold sores).      No current facility-administered medications on file prior to visit.     ALLERGIES: Allergies  Allergen  Reactions  . Morphine Nausea And Vomiting  . Oxycodone Other (See Comments) and Nausea And Vomiting  . Prednisone Other (See Comments)    Causes extreme hypotension    FAMILY HISTORY: Family History  Problem Relation Age of Onset  . Heart disease Father   . Colonic polyp Father   . Hypertension Mother   . Cancer Mother        SKIN  . Diabetes Mother   . Cancer Maternal Uncle        MELANOMA  . Alzheimer's disease Maternal Grandmother   . Diabetes Maternal Grandmother   . Hypertension Maternal Grandmother   . Colon cancer Neg  Hx     SOCIAL HISTORY: Social History   Socioeconomic History  . Marital status: Married    Spouse name: Cletus Gash   . Number of children: 3  . Years of education: 51  . Highest education level: Not on file  Social Needs  . Financial resource strain: Not on file  . Food insecurity - worry: Not on file  . Food insecurity - inability: Not on file  . Transportation needs - medical: Not on file  . Transportation needs - non-medical: Not on file  Occupational History  . Occupation: housewife  Tobacco Use  . Smoking status: Current Some Day Smoker    Packs/day: 0.25    Types: Cigarettes    Last attempt to quit: 09/08/1994    Years since quitting: 23.1  . Smokeless tobacco: Never Used  . Tobacco comment: husband just recently dx with cancer  Substance and Sexual Activity  . Alcohol use: No  . Drug use: No  . Sexual activity: Yes    Comment: intercourse age 16, sexualpartners less than 5  Other Topics Concern  . Not on file  Social History Narrative   ** Merged History Encounter **       ** Data from: 05/25/12 Enc Dept: [facility]       ** Data from: 07/20/13 Enc Dept: [facility]   Patient is married Cletus Gash).   Patient has 3 children.    Patient works at [employer] in [place].   Patient has high school education.   Caffeine consumption two cups daily        REVIEW OF SYSTEMS: Constitutional: No fevers, chills, or sweats, no  generalized fatigue, change in appetite Eyes: No visual changes, double vision, eye pain Ear, nose and throat: No hearing loss, ear pain, nasal congestion, sore throat Cardiovascular: No chest pain, palpitations Respiratory:  No shortness of breath at rest or with exertion, wheezes GastrointestinaI: No nausea, vomiting, diarrhea, abdominal pain, fecal incontinence Genitourinary:  No dysuria, urinary retention or frequency Musculoskeletal:  No neck pain, back pain Integumentary: No rash, pruritus, skin lesions Neurological: as above Psychiatric: No depression, insomnia, anxiety Endocrine: No palpitations, fatigue, diaphoresis, mood swings, change in appetite, change in weight, increased thirst Hematologic/Lymphatic:  No purpura, petechiae. Allergic/Immunologic: no itchy/runny eyes, nasal congestion, recent allergic reactions, rashes  PHYSICAL EXAM: Vitals:   10/23/17 1428  BP: (!) 136/92  Pulse: 80  SpO2: 97%   General: No acute distress.  Patient appears well-groomed.   Head:  Normocephalic/atraumatic Eyes:  Fundi examined but not visualized Neck: supple, no paraspinal tenderness, full range of motion Heart:  Regular rate and rhythm Lungs:  Clear to auscultation bilaterally Back: No paraspinal tenderness Neurological Exam: alert and oriented to person, place, and time. Attention span and concentration intact, recent and remote memory intact, fund of knowledge intact.  Speech fluent and not dysarthric, language intact.  Altered right V2-V3 sensation.  CN II-XII intact. Bulk and tone normal, muscle strength 5/5 throughout.  Sensation to light touch, temperature and vibration intact.  Deep tendon reflexes 2+ throughout, toes downgoing.  Finger to nose and heel to shin testing intact.  Ambulates with limp with cane.  Timed 25 foot walk 6.83 seconds with cane.  Romberg positive.  IMPRESSION: 1.  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  2.   Transaminitis.  Probably secondary to gallstones.  However, may be side effect of Gilenya, so we will recheck. 3.  Elevated blood pressure.  May be seen with Gilenya, although  usually with long-term use.   4.  Headache.  Suspect cervicogenic.  PLAN: 1.  Continue Gilenya, gabapentin.and D3 5000 IU daily 2.  Check hepatic function panel and D level today 3.  Given increased headaches, check MRI of brain and cervical spine with and without contrast. 4.  Advised to monitor blood pressure.  If difficult to control, consider changing Gilenya to another DMT. 5.  Repeat CBC with diff, hepatic panel and D level in 6 months, just prior to follow up.  26 minutes spent face to face with patient, over 50% spent discussing management.  Metta Clines, DO  CC:  Reinaldo Meeker, MD  Ricky Ala, MD

## 2017-10-27 LAB — CBC WITH DIFFERENTIAL/PLATELET
BASOS ABS: 39 {cells}/uL (ref 0–200)
Basophils Relative: 1.1 %
EOS ABS: 88 {cells}/uL (ref 15–500)
Eosinophils Relative: 2.5 %
HEMATOCRIT: 40.8 % (ref 35.0–45.0)
Hemoglobin: 14.1 g/dL (ref 11.7–15.5)
LYMPHS ABS: 319 {cells}/uL — AB (ref 850–3900)
MCH: 30.2 pg (ref 27.0–33.0)
MCHC: 34.6 g/dL (ref 32.0–36.0)
MCV: 87.4 fL (ref 80.0–100.0)
MPV: 10.7 fL (ref 7.5–12.5)
Monocytes Relative: 12.5 %
Neutro Abs: 2618 cells/uL (ref 1500–7800)
Neutrophils Relative %: 74.8 %
Platelets: 293 10*3/uL (ref 140–400)
RBC: 4.67 10*6/uL (ref 3.80–5.10)
RDW: 12.4 % (ref 11.0–15.0)
TOTAL LYMPHOCYTE: 9.1 %
WBC: 3.5 10*3/uL — ABNORMAL LOW (ref 3.8–10.8)
WBCMIX: 438 {cells}/uL (ref 200–950)

## 2017-10-27 LAB — HEPATIC FUNCTION PANEL
AG Ratio: 1.8 (calc) (ref 1.0–2.5)
ALBUMIN MSPROF: 4.3 g/dL (ref 3.6–5.1)
ALKALINE PHOSPHATASE (APISO): 101 U/L (ref 33–130)
ALT: 51 U/L — ABNORMAL HIGH (ref 6–29)
AST: 35 U/L (ref 10–35)
BILIRUBIN DIRECT: 0.2 mg/dL (ref 0.0–0.2)
BILIRUBIN INDIRECT: 0.8 mg/dL (ref 0.2–1.2)
BILIRUBIN TOTAL: 1 mg/dL (ref 0.2–1.2)
Globulin: 2.4 g/dL (calc) (ref 1.9–3.7)
Total Protein: 6.7 g/dL (ref 6.1–8.1)

## 2017-10-27 LAB — VITAMIN D 1,25 DIHYDROXY
VITAMIN D 1, 25 (OH) TOTAL: 34 pg/mL (ref 18–72)
Vitamin D2 1, 25 (OH)2: <8 pg/mL
Vitamin D3 1, 25 (OH)2: 34 pg/mL

## 2017-10-29 ENCOUNTER — Telehealth: Payer: Self-pay

## 2017-10-29 DIAGNOSIS — G35 Multiple sclerosis: Secondary | ICD-10-CM

## 2017-10-29 NOTE — Telephone Encounter (Signed)
-----   Message from Pieter Partridge, DO sent at 10/27/2017  1:16 PM EST ----- Liver tests are improved.  One of the enzymes is still elevated but less than before.  I would like to repeat LFTs again in one month.  Vitamin D level is 34.  I would like to raise the level more.  Recommend increasing D3 to 7000 IU daily and we will repeat in 6 months prior to follow up.

## 2017-10-29 NOTE — Telephone Encounter (Signed)
Called and spoke with Pt, advsd her of labs and increase in D3. Pt to come for HFT in one month, repeat Vit D, HFT, CBC w/diff on 8/8

## 2017-11-11 ENCOUNTER — Telehealth: Payer: Self-pay | Admitting: Neurology

## 2017-11-11 ENCOUNTER — Ambulatory Visit
Admission: RE | Admit: 2017-11-11 | Discharge: 2017-11-11 | Disposition: A | Discharge status: Home / Self Care | Payer: Federal, State, Local not specified - PPO | Source: Ambulatory Visit | Attending: Neurology | Admitting: Neurology

## 2017-11-11 DIAGNOSIS — R519 Headache, unspecified: Secondary | ICD-10-CM

## 2017-11-11 DIAGNOSIS — R51 Headache: Secondary | ICD-10-CM

## 2017-11-11 DIAGNOSIS — G35 Multiple sclerosis: Secondary | ICD-10-CM

## 2017-11-11 MED ORDER — GADOBENATE DIMEGLUMINE 529 MG/ML IV SOLN
20.0000 mL | Freq: Once | INTRAVENOUS | Status: AC | PRN
  Administered 2017-11-11: 20 mL via INTRAVENOUS

## 2017-11-11 NOTE — Telephone Encounter (Signed)
Pt left a VM message saying Dr Tomi Likens wanted her to call in and report any new symptoms, she is having an MRI today and she has noticed she is having weakness and shaking hands and wanted Dr Tomi Likens to be aware

## 2017-11-11 NOTE — Telephone Encounter (Signed)
Dr Jaffe *FYI* 

## 2017-11-12 NOTE — Telephone Encounter (Signed)
I called and spoke to [place].  She reports action and postural tremor in the hands.  Noticeable when she is holding something.  She denies other symptoms.  I reviewed the MRI of brain and cervical spine from yesterday.  Cervical spine looks normal.  MRI of brain still demonstrates punctate focus of abnormal contrast enhancement in the midbrain, which was seen back in August. It is not seen on other sequences. Unclear etiology and not characteristic for MS plaque.  A capillary telangiectasia is possible.  Report mentioned punctate infarct, but I do not suspect this.  I want to obtain the images of the MRIs performed in 2015 and 2017 at [facility] to see if it was present and just not mentioned in the report.  Further recommendations pending review.

## 2017-11-20 ENCOUNTER — Ambulatory Visit: Payer: Federal, State, Local not specified - PPO | Admitting: Neurology

## 2017-11-25 ENCOUNTER — Encounter: Payer: Self-pay | Admitting: Gastroenterology

## 2017-11-25 ENCOUNTER — Other Ambulatory Visit: Payer: Federal, State, Local not specified - PPO

## 2017-11-25 ENCOUNTER — Ambulatory Visit: Payer: Federal, State, Local not specified - PPO | Admitting: Gastroenterology

## 2017-11-25 VITALS — BP 132/88 | HR 70 | Ht 69.0 in | Wt 218.0 lb

## 2017-11-25 DIAGNOSIS — K649 Unspecified hemorrhoids: Secondary | ICD-10-CM

## 2017-11-25 DIAGNOSIS — G35 Multiple sclerosis: Secondary | ICD-10-CM

## 2017-11-25 NOTE — Patient Instructions (Addendum)
Dr Carlean Purl 12/08/17 at 3:15 pm for first hemorrhoid banding.   Continue avoiding asprin, NSAIDs.  Call if the epigastric discomforts are still bothersome after another 1-2 months.  Normal BMI (Body Mass Index- based on height and weight) is between 19 and 25. Your BMI today is Body mass index is 32.19 kg/m. Marland Kitchen Please consider follow up  regarding your BMI with your Primary Care Provider.

## 2017-11-25 NOTE — Progress Notes (Signed)
Review of pertinent gastrointestinal problems: 1. GERD, EGD 04/2015 was normal.  Recommended PPI daily, H2 blocker at bedtime. 2. Routine risk for colon cancer: Colonoscopy 04/2015 Dr. Ardis Hughs was normal.  Recommended repeat screening at 10 year interval. 3. Symptomatic gallstones: s/p lap chole Dr. Brantley Stage  09/2017; normal IOC, path chronic cholecystitis and cholelithiasis.  HPI: This is a very pleasant 52 year old woman whom I last saw 2 or 3 years ago the time of colonoscopy and upper endoscopy.  She has been here more recently and seen [place].  Eventually she underwent laparoscopic cholecystectomy about 1 or 2 months ago.  This was uneventful.  The IOC was normal.  She had been having intermittent rectal bleeding.  Polyp felt this was from a external swollen hemorrhoid 3 or 4 months ago.  She eventually advised that the patient stop taking her prophylactic aspirin and avoid NSAIDs.  She has been doing that and for the most part the bleeding stopped.  She did however have significant red rectal bleeding a week or 2 ago.  She has intermittent anal irritation.  She does not tend to have constipation.  Since her GB was removed, still has intermittent burning/knotting sensation in her epigastrium. Eating or drinking soothes this sensation.  Stools a bit looser overall since GB was removed 2 months ago.  Chief complaint is intermittent epigastric discomfort, red rectal bleeding  ROS: complete GI ROS as described in HPI, all other review negative.  Constitutional:  No unintentional weight loss   Past Medical History:  Diagnosis Date  . Anxiety   . Arthritis   . Bulging discs 1998   cervical  . Complication of anesthesia   . Constipation   . Depression   . Dysrhythmia   . Floppy mitral valve    dx 30 yrs ago  . GERD (gastroesophageal reflux disease)   . [name] syndrome since late teens  . Headache(784.0)   . Heart palpitations   . Hypertension   . MS (multiple sclerosis) ([place])   . PONV  (postoperative nausea and vomiting)    last couple surgeries, she has done ok    Past Surgical History:  Procedure Laterality Date  . ABDOMINAL HYSTERECTOMY    . CHOLECYSTECTOMY N/A 09/30/2017   Procedure: LAPAROSCOPIC CHOLECYSTECTOMY WITH INTRAOPERATIVE CHOLANGIOGRAM;  Surgeon: Erroll Luna, MD;  Location: [facility];  Service: General;  Laterality: N/A;  . DILATION AND CURETTAGE OF UTERUS    . ENDOVENOUS ABLATION SAPHENOUS VEIN W/ LASER  summer 2011   right and left greater saphenous veins ([place], [place])    . SHOULDER SURGERY     ligament reattachment  . TUBAL LIGATION    . VAGINAL HYSTERECTOMY    . VEIN LIGATION AND STRIPPING  05/07/2012   Procedure: VEIN LIGATION AND STRIPPING;  Surgeon: Rosetta Posner, MD;  Location: [facility] OR;  Service: Vascular;  Laterality: Left;  WITH STAB PHELBECTOMY    Current Outpatient Medications  Medication Sig Dispense Refill  . ALPRAZolam (XANAX) 0.5 MG tablet TAKE 1 TABLET BY MOUTH ONCE (1) DAILY AT BEDTIME AS NEEDED FOR ANXIETY 30 tablet 2  . Biotin 5000 MCG TABS Take 1 tablet by mouth daily.     Marland Kitchen BYSTOLIC 5 MG tablet Take 1 tablet daily by mouth.  3  . Cholecalciferol (VITAMIN D-3) 5000 units TABS Take 1 tablet by mouth daily. 7000 mg day    . diphenhydramine-acetaminophen (TYLENOL PM) 25-500 MG TABS tablet Take 1 tablet by mouth at bedtime as needed.    Marland Kitchen escitalopram (  LEXAPRO) 10 MG tablet TAKE 1 TABLET BY MOUTH ONCE (1) DAILY 30 tablet 5  . Fingolimod HCl (GILENYA) 0.5 MG CAPS Take 1 capsule (0.5 mg total) by mouth daily. 90 capsule 3  . gabapentin (NEURONTIN) 300 MG capsule Take 2 capsules (600 mg total) at bedtime by mouth. 120 capsule 6  . Garlic 2671 MG CAPS Take 1 capsule by mouth daily.     . Melatonin 10 MG TABS Take 1 tablet by mouth daily.     . nebivolol (BYSTOLIC) 5 MG tablet Take 5 mg by mouth daily.    . Omega-3 Fatty Acids (FISH OIL) 1000 MG CAPS Take 1 capsule by mouth daily.     . ondansetron (ZOFRAN) 4 MG tablet Take 1 tablet (4 mg  total) by mouth every 8 (eight) hours as needed for nausea or vomiting. 20 tablet 0  . valACYclovir (VALTREX) 500 MG tablet Take 500 mg by mouth daily as needed (cold sores).     Marland Kitchen omeprazole (PRILOSEC) 40 MG capsule Take 1 capsule (40 mg total) daily before breakfast by mouth. 30 capsule 3  . ranitidine (ZANTAC) 150 MG tablet Take 1 tablet (150 mg total) at bedtime by mouth. 30 tablet 3   No current facility-administered medications for this visit.     Allergies as of 11/25/2017 - Review Complete 11/25/2017  Allergen Reaction Noted  . Morphine Nausea And Vomiting 12/06/2015  . Oxycodone Other (See Comments) and Nausea And Vomiting 12/06/2015  . Prednisone Other (See Comments) 04/20/2012    Family History  Problem Relation Age of Onset  . Heart disease Father   . Colonic polyp Father   . Hypertension Mother   . Diabetes Mother   . Skin cancer Mother   . Alzheimer's disease Maternal Grandmother   . Diabetes Maternal Grandmother   . Hypertension Maternal Grandmother   . Colon cancer Neg Hx     Social History   Socioeconomic History  . Marital status: Married    Spouse name: Cletus Gash   . Number of children: 3  . Years of education: 79  . Highest education level: Not on file  Social Needs  . Financial resource strain: Not on file  . Food insecurity - worry: Not on file  . Food insecurity - inability: Not on file  . Transportation needs - medical: Not on file  . Transportation needs - non-medical: Not on file  Occupational History  . Occupation: housewife  Tobacco Use  . Smoking status: Current Some Day Smoker    Packs/day: 0.25    Types: Cigarettes    Last attempt to quit: 09/08/1994    Years since quitting: 23.2  . Smokeless tobacco: Never Used  . Tobacco comment: husband just recently dx with cancer  Substance and Sexual Activity  . Alcohol use: No  . Drug use: No  . Sexual activity: Yes    Comment: intercourse age 89, sexualpartners less than 5  Other Topics  Concern  . Not on file  Social History Narrative   ** Merged History Encounter **       ** Data from: 05/25/12 Enc Dept: [facility]       ** Data from: 07/20/13 Enc Dept: [facility]   Patient is married Cletus Gash).   Patient has 3 children.    Patient works at [employer] in [place].   Patient has high school education.   Caffeine consumption two cups daily         Physical Exam: BP 132/88   Pulse  70   Ht 5\' 9"  (1.753 m)   Wt 218 lb (98.9 kg)   BMI 32.19 kg/m  Constitutional: generally well-appearing Psychiatric: alert and oriented x3 Abdomen: soft, nontender, nondistended, no obvious ascites, no peritoneal signs, normal bowel sounds No peripheral edema noted in lower extremities Rectal exam with female assistant in the room; small to medium sized external anal hemorrhoids and clear internal hemorrhoids on digital rectal examination as well.  No distal rectal masses.  There was no blood present.  Stool was brown and not checked for Hemoccult   Assessment and plan: 52 y.o. female with intermittent rectal bleeding in the setting of known external and internal hemorrhoids.  She had been taking aspirin prophylactically, has never had heart disease that she is aware of.  I recommended that she not resume that aspirin which she stopped anyway a few weeks ago.  She is still having some intermittent rectal bleeding and I think it is from the mixture of internal and external hemorrhoids.  I am going to schedule her for an in office banding procedure for internal hemorrhoids.  Please see the "Patient Instructions" section for addition details about the plan.  Owens Loffler, MD [facility] 11/25/2017, 1:49 PM

## 2017-11-27 LAB — HEPATIC FUNCTION PANEL
AG Ratio: 1.7 (calc) (ref 1.0–2.5)
ALBUMIN MSPROF: 4.2 g/dL (ref 3.6–5.1)
ALKALINE PHOSPHATASE (APISO): 94 U/L (ref 33–130)
ALT: 35 U/L — ABNORMAL HIGH (ref 6–29)
AST: 20 U/L (ref 10–35)
BILIRUBIN DIRECT: 0.1 mg/dL (ref 0.0–0.2)
BILIRUBIN INDIRECT: 0.7 mg/dL (ref 0.2–1.2)
Globulin: 2.5 g/dL (calc) (ref 1.9–3.7)
Total Bilirubin: 0.8 mg/dL (ref 0.2–1.2)
Total Protein: 6.7 g/dL (ref 6.1–8.1)

## 2017-11-27 LAB — VITAMIN D 1,25 DIHYDROXY
VITAMIN D 1, 25 (OH) TOTAL: 45 pg/mL (ref 18–72)
Vitamin D3 1, 25 (OH)2: 45 pg/mL

## 2017-11-27 LAB — CBC WITH DIFFERENTIAL/PLATELET
Basophils Absolute: 22 cells/uL (ref 0–200)
Basophils Relative: 0.5 %
Eosinophils Absolute: 69 cells/uL (ref 15–500)
Eosinophils Relative: 1.6 %
HCT: 37.6 % (ref 35.0–45.0)
Hemoglobin: 13.3 g/dL (ref 11.7–15.5)
Lymphs Abs: 224 cells/uL — ABNORMAL LOW (ref 850–3900)
MCH: 31.4 pg (ref 27.0–33.0)
MCHC: 35.4 g/dL (ref 32.0–36.0)
MCV: 88.9 fL (ref 80.0–100.0)
MPV: 10.7 fL (ref 7.5–12.5)
Monocytes Relative: 13.6 %
NEUTROS PCT: 79.1 %
Neutro Abs: 3401 cells/uL (ref 1500–7800)
PLATELETS: 295 10*3/uL (ref 140–400)
RBC: 4.23 10*6/uL (ref 3.80–5.10)
RDW: 12.6 % (ref 11.0–15.0)
TOTAL LYMPHOCYTE: 5.2 %
WBC: 4.3 10*3/uL (ref 3.8–10.8)
WBCMIX: 585 {cells}/uL (ref 200–950)

## 2017-12-02 ENCOUNTER — Telehealth: Payer: Self-pay

## 2017-12-02 DIAGNOSIS — Z79899 Other long term (current) drug therapy: Secondary | ICD-10-CM

## 2017-12-02 DIAGNOSIS — D729 Disorder of white blood cells, unspecified: Secondary | ICD-10-CM

## 2017-12-02 DIAGNOSIS — E559 Vitamin D deficiency, unspecified: Secondary | ICD-10-CM

## 2017-12-02 NOTE — Telephone Encounter (Signed)
Yes, that is fine. 

## 2017-12-02 NOTE — Telephone Encounter (Addendum)
Called and spoke with Pt, advsd her of all lab results. Talked with her about D3 increase. Pt was actually instructed by [place] to increase D3 from 5000 IU to 7000 IU on 10/29/17. Advsd her to increase to 8000 IU daily.  Pt will come 02/03/18 for CBC w/diff, and 1 week prior to Aug f/u appt for CBC w/diff, LFT, Vit. D  ----- Message from Pieter Partridge, DO sent at 12/01/2017  1:38 PM EDT ----- Labs overall are stable.  Lymphocyte count is low as expected.  We will keep a close eye.  Liver function is stable.  One of the enzymes is borderline elevated, which is lower than the previous testing.  Vitamin D level is 45 which is slightly lower than I would like it.  Recommend increasing D3 to 6000 IU daily.  Repeat CBC with diff in 3 months and again in 5 months (just prior to follow up).  Repeat LFTs and vitamin D level in 5 months just prior to follow up.

## 2017-12-07 HISTORY — PX: HEMORRHOID BANDING: SHX5850

## 2017-12-08 ENCOUNTER — Encounter: Payer: Self-pay | Admitting: Internal Medicine

## 2017-12-08 ENCOUNTER — Ambulatory Visit: Payer: Federal, State, Local not specified - PPO | Admitting: Internal Medicine

## 2017-12-08 VITALS — BP 120/90 | HR 72 | Ht 67.75 in | Wt 214.5 lb

## 2017-12-08 DIAGNOSIS — K642 Third degree hemorrhoids: Secondary | ICD-10-CM | POA: Diagnosis not present

## 2017-12-08 NOTE — Patient Instructions (Addendum)
HEMORRHOID BANDING PROCEDURE    FOLLOW-UP CARE   1. The procedure you have had should have been relatively painless since the banding of the area involved does not have nerve endings and there is no pain sensation.  The rubber band cuts off the blood supply to the hemorrhoid and the band may fall off as soon as 48 hours after the banding (the band may occasionally be seen in the toilet bowl following a bowel movement). You may notice a temporary feeling of fullness in the rectum which should respond adequately to plain Tylenol or Motrin.  2. Following the banding, avoid strenuous exercise that evening and resume full activity the next day.  A sitz bath (soaking in a warm tub) or bidet is soothing, and can be useful for cleansing the area after bowel movements.     3. To avoid constipation, take two tablespoons of natural wheat bran, natural oat bran, flax, Benefiber or any over the counter fiber supplement and increase your water intake to 7-8 glasses daily.    4. Unless you have been prescribed anorectal medication, do not put anything inside your rectum for two weeks: No suppositories, enemas, fingers, etc.  5. Occasionally, you may have more bleeding than usual after the banding procedure.  This is often from the untreated hemorrhoids rather than the treated one.  Don't be concerned if there is a tablespoon or so of blood.  If there is more blood than this, lie flat with your bottom higher than your head and apply an ice pack to the area. If the bleeding does not stop within a half an hour or if you feel faint, call our office at [phone] or go to the emergency room.  6. Problems are not common; however, if there is a substantial amount of bleeding, severe pain, chills, fever or difficulty passing urine (very rare) or other problems, you should call us at [phone] or report to the nearest emergency room.  7. Do not stay seated continuously for more than 2-3 hours for a day or two  after the procedure.  Tighten your buttock muscles 10-15 times every two hours and take 10-15 deep breaths every 1-2 hours.  Do not spend more than a few minutes on the toilet if you cannot empty your bowel; instead re-visit the toilet at a later time.    Please try to use benefiber , handout provided.   We will see you back 12/23/17 at 10:15AM.    I appreciate the opportunity to care for you. Silvano Rusk, MD, [facility]

## 2017-12-08 NOTE — Progress Notes (Signed)
    Hemorrhoid banding: Symptoms are bleeding swelling anal irritation and prolapse symptoms Negative colonoscopy 2016 Dr. Ardis Hughs  Does have alternating bowel habits at times with rare straining  Rectal exam with female staff present demonstrates protruding hemorrhoids  and small fleshy anal tags in all positions  Anoscopic exam with female staff present shows grade 3 internal hemorrhoids in all positions.  It is possible there is an external component but I really believe that the hemorrhoids are above the dentate line but just prolapsing.  The distal ends of the hemorrhoidal tissue show some mild inflammatory changes  PROCEDURE NOTE: The patient presents with symptomatic grade3 hemorrhoids, requesting rubber band ligation of his/her hemorrhoidal disease.  All risks, benefits and alternative forms of therapy were described and informed consent was obtained.   The anorectum was pre-medicated with topical nitroglycerin and lidocaine The decision was made to band the right posterior internal hemorrhoid, and the [place] was used to perform band ligation without complication.  Digital anorectal examination was then performed to assure proper positioning of the band, and to adjust the banded tissue as required.  The patient was discharged home without pain or other issues.  Dietary and behavioral recommendations were given and along with follow-up instructions.     The following adjunctive treatments were recommended:  Benefiber 1 tablespoon daily  The patient will return in mid April for  follow-up and possible additional banding as required. No complications were encountered and the patient tolerated the procedure well.  We anticipate banding the left lateral and right anterior columns at that time  I appreciate the opportunity to care for this patient. CC: Lillard Anes, MD Dr. Oretha Caprice

## 2017-12-23 ENCOUNTER — Other Ambulatory Visit: Payer: Self-pay

## 2017-12-23 ENCOUNTER — Telehealth: Payer: Self-pay | Admitting: Gastroenterology

## 2017-12-23 ENCOUNTER — Encounter: Payer: Self-pay | Admitting: Internal Medicine

## 2017-12-23 ENCOUNTER — Ambulatory Visit: Payer: Federal, State, Local not specified - PPO | Admitting: Internal Medicine

## 2017-12-23 VITALS — BP 122/66 | HR 63 | Ht 67.75 in | Wt 216.0 lb

## 2017-12-23 DIAGNOSIS — K642 Third degree hemorrhoids: Secondary | ICD-10-CM | POA: Diagnosis not present

## 2017-12-23 MED ORDER — OMEPRAZOLE 40 MG PO CPDR: 40.0000 mg | DELAYED_RELEASE_CAPSULE | Freq: Every day | ORAL | 1 refills | Status: DC

## 2017-12-23 MED ORDER — OMEPRAZOLE 40 MG PO CPDR: 40.0000 mg | DELAYED_RELEASE_CAPSULE | Freq: Every day | ORAL | 3 refills | Status: DC

## 2017-12-23 NOTE — Progress Notes (Signed)
   HEMORRHOID LIGATION  S/p banding RP   Sxs: bleeding, swelling, irritation, prolapse Improved  PROCEDURE NOTE: The patient presents with symptomatic grade 2-3  hemorrhoids, requesting rubber band ligation of his/her hemorrhoidal disease.  All risks, benefits and alternative forms of therapy were described and informed consent was obtained.   The anorectum was pre-medicated with 0.125% NTG and 5% lidocaine The decision was made to band the RA and :LL internal hemorrhoids, and the [place] was used to perform band ligation without complication.  Digital anorectal examination was then performed to assure proper positioning of the band, and to adjust the banded tissue as required.  The patient was discharged home without pain or other issues.  Dietary and behavioral recommendations were given and along with follow-up instructions.     The following adjunctive treatments were recommended:    The patient will return as needed for  follow-up and possible additional banding as required. No complications were encountered and the patient tolerated the procedure well.  I appreciate the opportunity to care for this patient. OE:HOZYY, Zeb Comfort, MD Oretha Caprice, MD

## 2017-12-23 NOTE — Telephone Encounter (Signed)
Patient states she needs medication omeprazole refilled at [facility]. Pt had ov with Dr.Jacobs 3.20.19 and hem banding done today.

## 2017-12-23 NOTE — Telephone Encounter (Signed)
Omeprazole filled and sent to [facility].

## 2017-12-23 NOTE — Telephone Encounter (Signed)
Omeprazole refilled as pharmacy requested.  Per Dr Ardis Hughs last office notes patient was to continue her PPI.

## 2017-12-23 NOTE — Patient Instructions (Signed)
HEMORRHOID BANDING PROCEDURE    FOLLOW-UP CARE   1. The procedure you have had should have been relatively painless since the banding of the area involved does not have nerve endings and there is no pain sensation.  The rubber band cuts off the blood supply to the hemorrhoid and the band may fall off as soon as 48 hours after the banding (the band may occasionally be seen in the toilet bowl following a bowel movement). You may notice a temporary feeling of fullness in the rectum which should respond adequately to plain Tylenol or Motrin.  2. Following the banding, avoid strenuous exercise that evening and resume full activity the next day.  A sitz bath (soaking in a warm tub) or bidet is soothing, and can be useful for cleansing the area after bowel movements.     3. To avoid constipation, take two tablespoons of natural wheat bran, natural oat bran, flax, Benefiber or any over the counter fiber supplement and increase your water intake to 7-8 glasses daily.    4. Unless you have been prescribed anorectal medication, do not put anything inside your rectum for two weeks: No suppositories, enemas, fingers, etc.  5. Occasionally, you may have more bleeding than usual after the banding procedure.  This is often from the untreated hemorrhoids rather than the treated one.  Don't be concerned if there is a tablespoon or so of blood.  If there is more blood than this, lie flat with your bottom higher than your head and apply an ice pack to the area. If the bleeding does not stop within a half an hour or if you feel faint, call our office at [phone] or go to the emergency room.  6. Problems are not common; however, if there is a substantial amount of bleeding, severe pain, chills, fever or difficulty passing urine (very rare) or other problems, you should call us at [phone] or report to the nearest emergency room.  7. Do not stay seated continuously for more than 2-3 hours for a day or two  after the procedure.  Tighten your buttock muscles 10-15 times every two hours and take 10-15 deep breaths every 1-2 hours.  Do not spend more than a few minutes on the toilet if you cannot empty your bowel; instead re-visit the toilet at a later time.    Call us after mid June if your still having problems.   I appreciate the opportunity to care for you. Silvano Rusk, MD, [facility]

## 2017-12-28 ENCOUNTER — Encounter: Payer: Federal, State, Local not specified - PPO | Admitting: Gynecology

## 2018-01-11 ENCOUNTER — Other Ambulatory Visit: Payer: Self-pay | Admitting: Gynecology

## 2018-01-11 NOTE — Telephone Encounter (Signed)
CE scheduled 02/16/18.

## 2018-01-11 NOTE — Telephone Encounter (Signed)
Okay to refill x1 

## 2018-01-12 NOTE — Telephone Encounter (Signed)
Rx called in 

## 2018-02-03 ENCOUNTER — Other Ambulatory Visit: Payer: Federal, State, Local not specified - PPO

## 2018-02-03 DIAGNOSIS — E559 Vitamin D deficiency, unspecified: Secondary | ICD-10-CM

## 2018-02-03 DIAGNOSIS — Z79899 Other long term (current) drug therapy: Secondary | ICD-10-CM

## 2018-02-03 DIAGNOSIS — D729 Disorder of white blood cells, unspecified: Secondary | ICD-10-CM

## 2018-02-07 LAB — CBC WITH DIFFERENTIAL/PLATELET
BASOS ABS: 31 {cells}/uL (ref 0–200)
Basophils Relative: 0.8 %
EOS ABS: 70 {cells}/uL (ref 15–500)
Eosinophils Relative: 1.8 %
HEMATOCRIT: 42.2 % (ref 35.0–45.0)
HEMOGLOBIN: 14.5 g/dL (ref 11.7–15.5)
LYMPHS ABS: 250 {cells}/uL — AB (ref 850–3900)
MCH: 30.7 pg (ref 27.0–33.0)
MCHC: 34.4 g/dL (ref 32.0–36.0)
MCV: 89.2 fL (ref 80.0–100.0)
MPV: 11.1 fL (ref 7.5–12.5)
Monocytes Relative: 11.5 %
NEUTROS ABS: 3101 {cells}/uL (ref 1500–7800)
Neutrophils Relative %: 79.5 %
Platelets: 286 10*3/uL (ref 140–400)
RBC: 4.73 10*6/uL (ref 3.80–5.10)
RDW: 12 % (ref 11.0–15.0)
Total Lymphocyte: 6.4 %
WBC: 3.9 10*3/uL (ref 3.8–10.8)
WBCMIX: 449 {cells}/uL (ref 200–950)

## 2018-02-07 LAB — HEPATIC FUNCTION PANEL
AG RATIO: 1.8 (calc) (ref 1.0–2.5)
ALBUMIN MSPROF: 4.6 g/dL (ref 3.6–5.1)
ALT: 38 U/L — AB (ref 6–29)
AST: 22 U/L (ref 10–35)
Alkaline phosphatase (APISO): 112 U/L (ref 33–130)
BILIRUBIN TOTAL: 1.2 mg/dL (ref 0.2–1.2)
Bilirubin, Direct: 0.2 mg/dL (ref 0.0–0.2)
Globulin: 2.5 g/dL (calc) (ref 1.9–3.7)
Indirect Bilirubin: 1 mg/dL (calc) (ref 0.2–1.2)
Total Protein: 7.1 g/dL (ref 6.1–8.1)

## 2018-02-07 LAB — VITAMIN D 1,25 DIHYDROXY
Vitamin D 1, 25 (OH)2 Total: 58 pg/mL (ref 18–72)
Vitamin D3 1, 25 (OH)2: 58 pg/mL

## 2018-02-16 ENCOUNTER — Ambulatory Visit: Payer: Federal, State, Local not specified - PPO | Admitting: Gynecology

## 2018-02-16 ENCOUNTER — Encounter: Payer: Self-pay | Admitting: Gynecology

## 2018-02-16 ENCOUNTER — Telehealth: Payer: Self-pay

## 2018-02-16 VITALS — BP 124/82 | Ht 68.5 in | Wt 212.0 lb

## 2018-02-16 DIAGNOSIS — Z01419 Encounter for gynecological examination (general) (routine) without abnormal findings: Secondary | ICD-10-CM

## 2018-02-16 DIAGNOSIS — Z79899 Other long term (current) drug therapy: Secondary | ICD-10-CM

## 2018-02-16 DIAGNOSIS — R300 Dysuria: Secondary | ICD-10-CM | POA: Diagnosis not present

## 2018-02-16 DIAGNOSIS — E559 Vitamin D deficiency, unspecified: Secondary | ICD-10-CM

## 2018-02-16 NOTE — Telephone Encounter (Signed)
I never heard back from that individual.  We can discuss further next visit.

## 2018-02-16 NOTE — Progress Notes (Signed)
    Crystal Simon 1966-06-08 [id number]        52 y.o.  G3P3 for annual gynecologic exam.  Without gynecologic complaints.  Past medical history,surgical history, problem list, medications, allergies, family history and social history were all reviewed and documented as reviewed in the EPIC chart.  ROS:  Performed with pertinent positives and negatives included in the history, assessment and plan.   Additional significant findings : None   Exam: Caryn Bee assistant Vitals:   02/16/18 1411  BP: 124/82  Weight: 212 lb (96.2 kg)  Height: 5' 8.5" (1.74 m)   Body mass index is 31.77 kg/m.  General appearance:  Normal affect, orientation and appearance. Skin: Grossly normal HEENT: Without gross lesions.  No cervical or supraclavicular adenopathy. Thyroid normal.  Lungs:  Clear without wheezing, rales or rhonchi Cardiac: RR, without RMG Abdominal:  Soft, nontender, without masses, guarding, rebound, organomegaly or hernia Breasts:  Examined lying and sitting without masses, retractions, discharge or axillary adenopathy. Pelvic:  Ext, BUS, Vagina: Normal  Adnexa: Without masses or tenderness    Anus and perineum: Normal   Rectovaginal: Normal sphincter tone without palpated masses or tenderness.    Assessment/Plan:  52 y.o. G3P3 female for annual gynecologic exam, status post TVH for endometriosis.   1. Doing well without symptoms of menopause such as hot flushes, night sweats or vaginal dryness. 2. History of occasional dysuria.  Does have a history of bladder spasm.  No other signs such as frequency urgency low back pain fever or chills.  Exam is normal.  Will check baseline urine analysis to rule out UTI. 3. Pap smear 2018.  No Pap smear done today.  No history of significant abnormal Pap smears.  Options to stop screening per current screening guidelines based on hysterectomy history versus continuing to screen reviewed.  Will readdress on an annual basis. 4. Mammography 2017.   I reminded patient she is overdue and she agrees to call and schedule.  Breast exam normal today. 5. Colonoscopy 2016.  Repeat at their recommended interval. 6. Health maintenance.  No routine lab work done as patient does this elsewhere.  Follow-up 1 year, sooner as needed.   Anastasio Auerbach MD, 2:34 PM 02/16/2018

## 2018-02-16 NOTE — Patient Instructions (Signed)
Call to Schedule your mammogram  Facilities in [place]: 1)  [facility], [address] Phone: [phone] 2)  Dr. Bertrand at [facility]  [address] Phone: [phone]     Mammogram A mammogram is an X-ray test to find changes in a woman's breast. You should get a mammogram if:  You are 53 years of age or older  You have risk factors.   Your doctor recommends that you have one.  BEFORE THE TEST  Do not schedule the test the week before your period, especially if your breasts are sore during this time.  On the day of your mammogram:  Wash your breasts and armpits well. After washing, do not put on any deodorant or talcum powder on until after your test.   Eat and drink as you usually do.   Take your medicines as usual.   If you are diabetic and take insulin, make sure you:   Eat before coming for your test.   Take your insulin as usual.   If you cannot keep your appointment, call before the appointment to cancel. Schedule another appointment.  TEST  You will need to undress from the waist up. You will put on a hospital gown.   Your breast will be put on the mammogram machine, and it will press firmly on your breast with a piece of plastic called a compression paddle. This will make your breast flatter so that the machine can X-ray all parts of your breast.   Both breasts will be X-rayed. Each breast will be X-rayed from above and from the side. An X-ray might need to be taken again if the picture is not good enough.   The mammogram will last about 15 to 30 minutes.  AFTER THE TEST Finding out the results of your test Ask when your test results will be ready. Make sure you get your test results.  Document Released: 11/21/2008 Document Revised: 08/14/2011 Document Reviewed: 11/21/2008 ExitCare Patient Information 2012 [employer].   

## 2018-02-16 NOTE — Telephone Encounter (Signed)
-----   Message from Pieter Partridge, DO sent at 02/15/2018  5:28 PM EDT ----- The elevated transaminase ALT is only slightly elevated (lower than 4 months ago), which is good.  One thing to check is if she has started any new medication or new supplements 4 months ago what could also affect the liver.  Otherwise, I wouldn't make any changes and to repeat labs just prior to follow up.

## 2018-02-16 NOTE — Telephone Encounter (Signed)
Called and spoke with Pt, advised her of lab results. She states she did start Lexapro and Bystolic, but unsure of the timeframe. She will come 1 week prior to next appt for repeat labs. Pt mentioned you were to have someone else review her imaging and call her back.

## 2018-02-17 NOTE — Telephone Encounter (Signed)
Called and spoke to Pt, she is agreeable to discuss in Aug at next visit.

## 2018-04-12 ENCOUNTER — Other Ambulatory Visit: Payer: Self-pay | Admitting: Gynecology

## 2018-04-12 ENCOUNTER — Other Ambulatory Visit: Payer: Self-pay | Admitting: Neurology

## 2018-04-14 ENCOUNTER — Other Ambulatory Visit: Payer: Federal, State, Local not specified - PPO

## 2018-04-14 ENCOUNTER — Other Ambulatory Visit: Payer: Self-pay | Admitting: Neurology

## 2018-04-17 LAB — HEPATIC FUNCTION PANEL
AG Ratio: 1.9 (calc) (ref 1.0–2.5)
ALT: 35 U/L — ABNORMAL HIGH (ref 6–29)
AST: 21 U/L (ref 10–35)
Albumin: 4.5 g/dL (ref 3.6–5.1)
Alkaline phosphatase (APISO): 112 U/L (ref 33–130)
Bilirubin, Direct: 0.2 mg/dL (ref 0.0–0.2)
Globulin: 2.4 g/dL (calc) (ref 1.9–3.7)
Indirect Bilirubin: 0.8 mg/dL (calc) (ref 0.2–1.2)
Total Bilirubin: 1 mg/dL (ref 0.2–1.2)
Total Protein: 6.9 g/dL (ref 6.1–8.1)

## 2018-04-17 LAB — CBC
HCT: 40.6 % (ref 35.0–45.0)
HEMOGLOBIN: 14.3 g/dL (ref 11.7–15.5)
MCH: 31.4 pg (ref 27.0–33.0)
MCHC: 35.2 g/dL (ref 32.0–36.0)
MCV: 89 fL (ref 80.0–100.0)
MPV: 10.8 fL (ref 7.5–12.5)
Platelets: 308 10*3/uL (ref 140–400)
RBC: 4.56 10*6/uL (ref 3.80–5.10)
RDW: 12.3 % (ref 11.0–15.0)
WBC: 5.9 10*3/uL (ref 3.8–10.8)

## 2018-04-17 LAB — VITAMIN D 1,25 DIHYDROXY
VITAMIN D3 1, 25 (OH): 48 pg/mL
Vitamin D 1, 25 (OH)2 Total: 48 pg/mL (ref 18–72)

## 2018-04-22 ENCOUNTER — Ambulatory Visit: Payer: Federal, State, Local not specified - PPO | Admitting: Neurology

## 2018-04-22 NOTE — Progress Notes (Deleted)
NEUROLOGY FOLLOW UP OFFICE NOTE  [name] [id number]  HISTORY OF PRESENT ILLNESS: Crystal Simon is a 52 year old right-handed female with follows up for multiple sclerosis.  UPDATE: She is taking Gilenya.  She also takes gabapentin 600mg  at bedtime, D3 7000 IU daily and B12 1064mcg daily  Vision:  Stable Motor:  No Sensory:  Chronic sensory deficits, unchanged. Pain:  *** Gait:  Chronic disability with cane, unchanged. Bowel/bladder:  Normal Fatigue:  *** Cognition:  *** Mood:  ***  In February, she reported some headaches, a shooting pain radiating from the back of the head to the eye, usually right sided but also left sided as well.  It lasts only a few seconds and occurs two to three times a week.  She reported some neck pain.  She also endorsed weakness and postural tremor in the hands.  Due to these new symptoms, she underwent repeat MRI of brain and cervical spine with and without contrast on 11/11/17, which was personally reviewed.  The brain showed stable and unchanged punctate focus of abnormal contrast enhancement in the left paramedian midbrain which was seen back in August.  It is not seen on other sequences.  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  In January, she had an elevatedt bili 1.3, AST of 52 and ALT of 69.  It may have been due to gallstones rather than Gilenya, for which she underwent surgery.  Over the next several months, her LFTs have been monitored and have steadily trended down.  Most recent hepatic panel from 04/14/18 demonstrated t bili 1, ALP 112, AST 21 and ALT 35.  Vitamin D level from 11/25/17 was 45 and she was advised to increase D3 from 5000 IU to 7000 IU daily.  Repeat D level from 04/14/18 was 48.  CBC demonstrated WBC of 5.9 but differential was not included.  HISTORY: Initial symptom in 2012 presented with 1 week episode of vertigo with feeling off-balance.  This was followed by  intermittent numbness and tingling of her hands and feet.  In November 2014, she developed double vision associated with slurred speech, right sided numbness and difficulty swallowing (the right side of her mouth, tongue and throat felt numb).  MRI of the brain with and without contrast from 07/12/13 was personally reviewed and showed hyperintense T2 lesions in the on left side of the midbrain.  MRI of cervical spine from 08/02/13 was personally reviewed and was negative for demyelinating lesions.  She underwent a lumbar puncture in January 2015.  CSF cell count was 1, protein 33, glucose 56, 3 oligoclonal bands (not present in serum), negative myelin basic protein, IgG index 0.52.  NMO antibody was negative.  Past disease modifying medication:  Tecfidera (effective but caused significant GERD).  Repeat MRI of the brain with and without contrast from 01/18/14 was reportedly normal with resolution of the previous midbrain lesion (report available but not images).  Most recent MRI of brain with and without contrast from 10/09/15 was reportedly again unremarkable and showed no significant white matter disease.  Known 6 mm pineal cyst was noted.  MRI of brain with and without contrast from 04/14/17 demonstrated punctate focus of enhancement in the location of previous left midbrain lesion, of uncertain etiology and significance.  Pineal cyst measures 7 mm.  At baseline, she had paresthesias of the right side of her face and throat.  She also has paresthesias in the upper extremities and toes, worse  on the right.  Sometimes she needs to use a cane. Heat (such as during the summer months), stress and lack of sleep exacerbate chronic symptoms.  She also has restless leg syndrome.  PAST MEDICAL HISTORY: Past Medical History:  Diagnosis Date  . Anxiety   . Arthritis   . Bulging discs 1998   cervical  . Complication of anesthesia   . Constipation   . Depression   . Dysrhythmia   . Floppy mitral valve    dx  30 yrs ago  . GERD (gastroesophageal reflux disease)   . [name] syndrome since late teens  . Headache(784.0)   . Heart palpitations   . Hemorrhoids   . Hypertension   . MS (multiple sclerosis) ([place])   . PONV (postoperative nausea and vomiting)    last couple surgeries, she has done ok    MEDICATIONS: Current Outpatient Medications on File Prior to Visit  Medication Sig Dispense Refill  . ALPRAZolam (XANAX) 0.5 MG tablet TAKE 1 TABLET BY MOUTH ONCE (1) DAILY AT BEDTIME AS NEEDED FOR ANXIETY 30 tablet 5  . Biotin 5000 MCG TABS Take 1 tablet by mouth daily.     . Cholecalciferol (VITAMIN D-3) 5000 units TABS Take 1 tablet by mouth daily. 7000 mg day    . diphenhydramine-acetaminophen (TYLENOL PM) 25-500 MG TABS tablet Take 1 tablet by mouth at bedtime as needed.    Marland Kitchen escitalopram (LEXAPRO) 10 MG tablet TAKE 1 TABLET BY MOUTH ONCE (1) DAILY 30 tablet 5  . Fingolimod HCl (GILENYA) 0.5 MG CAPS Take 1 capsule (0.5 mg total) by mouth daily. 90 capsule 3  . gabapentin (NEURONTIN) 300 MG capsule TAKE 2 CAPSULES BY MOUTH AT BEDTIME 180 capsule 1  . Melatonin 10 MG TABS Take 1 tablet by mouth daily.     . nebivolol (BYSTOLIC) 5 MG tablet Take 5 mg by mouth daily.    . Omega-3 Fatty Acids (FISH OIL) 1000 MG CAPS Take 1 capsule by mouth daily.     Marland Kitchen omeprazole (PRILOSEC) 40 MG capsule Take 1 capsule (40 mg total) by mouth daily before breakfast. 90 capsule 3  . ranitidine (ZANTAC) 150 MG tablet Take 1 tablet (150 mg total) at bedtime by mouth. 30 tablet 3  . valACYclovir (VALTREX) 500 MG tablet Take 500 mg by mouth daily as needed (cold sores).      No current facility-administered medications on file prior to visit.     ALLERGIES: Allergies  Allergen Reactions  . Morphine Nausea And Vomiting  . Oxycodone Other (See Comments) and Nausea And Vomiting  . Latex Rash  . Prednisone Other (See Comments)    Causes extreme hypotension    FAMILY HISTORY: Family History  Problem Relation Age of  Onset  . Heart disease Father   . Colonic polyp Father   . Hypertension Mother   . Diabetes Mother   . Skin cancer Mother   . Alzheimer's disease Maternal Grandmother   . Diabetes Maternal Grandmother   . Hypertension Maternal Grandmother   . Colon cancer Neg Hx    ***.  SOCIAL HISTORY: Social History   Socioeconomic History  . Marital status: Married    Spouse name: Cletus Gash   . Number of children: 3  . Years of education: 45  . Highest education level: Not on file  Occupational History  . Occupation: housewife  Social Needs  . Financial resource strain: Not on file  . Food insecurity:    Worry: Not on file  Inability: Not on file  . Transportation needs:    Medical: Not on file    Non-medical: Not on file  Tobacco Use  . Smoking status: Current Some Day Smoker    Packs/day: 0.25    Types: Cigarettes    Last attempt to quit: 09/08/1994    Years since quitting: 23.6  . Smokeless tobacco: Never Used  . Tobacco comment: husband just recently dx with cancer  Substance and Sexual Activity  . Alcohol use: No  . Drug use: No  . Sexual activity: Yes    Comment: intercourse age 76, sexual partners less than 5  Lifestyle  . Physical activity:    Days per week: Not on file    Minutes per session: Not on file  . Stress: Not on file  Relationships  . Social connections:    Talks on phone: Not on file    Gets together: Not on file    Attends religious service: Not on file    Active member of club or organization: Not on file    Attends meetings of clubs or organizations: Not on file    Relationship status: Not on file  . Intimate partner violence:    Fear of current or ex partner: Not on file    Emotionally abused: Not on file    Physically abused: Not on file    Forced sexual activity: Not on file  Other Topics Concern  . Not on file  Social History Narrative   ** Merged History Encounter **       ** Data from: 05/25/12 Enc Dept: [facility]       ** Data  from: 07/20/13 Enc Dept: [facility]   Patient is married Cletus Gash).   Patient has 3 children.    Patient works at [employer] in [place].   Patient has high school education.   Caffeine consumption two cups daily        REVIEW OF SYSTEMS: Constitutional: No fevers, chills, or sweats, no generalized fatigue, change in appetite Eyes: No visual changes, double vision, eye pain Ear, nose and throat: No hearing loss, ear pain, nasal congestion, sore throat Cardiovascular: No chest pain, palpitations Respiratory:  No shortness of breath at rest or with exertion, wheezes GastrointestinaI: No nausea, vomiting, diarrhea, abdominal pain, fecal incontinence Genitourinary:  No dysuria, urinary retention or frequency Musculoskeletal:  No neck pain, back pain Integumentary: No rash, pruritus, skin lesions Neurological: as above Psychiatric: No depression, insomnia, anxiety Endocrine: No palpitations, fatigue, diaphoresis, mood swings, change in appetite, change in weight, increased thirst Hematologic/Lymphatic:  No purpura, petechiae. Allergic/Immunologic: no itchy/runny eyes, nasal congestion, recent allergic reactions, rashes  PHYSICAL EXAM: *** General: No acute distress.  Patient appears ***-groomed.  *** body habitus. Head:  Normocephalic/atraumatic Eyes:  Fundi examined but not visualized Neck: supple, no paraspinal tenderness, full range of motion Heart:  Regular rate and rhythm Lungs:  Clear to auscultation bilaterally Back: No paraspinal tenderness Neurological Exam: alert and oriented to person, place, and time. Attention span and concentration intact, recent and remote memory intact, fund of knowledge intact.  Speech fluent and not dysarthric, language intact.  Altered rigth V2-V3 sensation.  Otherwise, CN II-XII intact. Bulk and tone normal, muscle strength 5/5 throughout.  Sensation to light touch, temperature and vibration intact.  Deep tendon reflexes 2+ throughout, toes  downgoing.  Finger to nose and heel to shin testing intact.  Ambulates with limp with cane.  Timed 25 foot walk 6.83 *** seconds.  Romberg positive.  IMPRESSION: Multiple sclerosis  PLAN: 1.  Continue Gilenya, gabapentin, D3 7000 IU daily 2.  Check CBC with differential today.  Repeat CBC with diff, hepatic panel and D level in 6 months 3.  Follow up in 6 months.  Metta Clines, DO  CC:  Reinaldo Meeker, MD  Ricky Ala, MD

## 2018-04-23 NOTE — Progress Notes (Signed)
NEUROLOGY FOLLOW UP OFFICE NOTE  Crystal Simon [id number]  HISTORY OF PRESENT ILLNESS: Crystal Simon is a 52 year old right-handed female with follows up for multiple sclerosis.  UPDATE: She is taking Gilenya.  She also takes gabapentin 600mg  at bedtime, D3 8000 IU daily.  She has not been taking B12 1070mcg daily.  Vision:  Stable Motor:  No Sensory:  Chronic sensory deficits, unchanged.  Tongue is numb, which sometimes causes slurred speech.   Pain:  No Gait:  Chronic disability with cane, unchanged. Bowel/bladder:  Normal Fatigue:  Increased fatigue over the past 2 weeks.  She feels diffusely weak.  Sleep is poor.  Her legs feel uncomfortable.  Moving the legs helps relieve it.  She will often take a Xanax as a last resort but it causes constipation.   Cognition:  OK.   Mood:  OK  In February, she reported some headaches, a shooting pain radiating from the back of the head to the eye, usually right sided but also left sided as well.  It lasts only a few seconds and occurs two to three times a week.  She reported some neck pain.  She also endorsed weakness and postural tremor in the hands.  Due to these new symptoms, she underwent repeat MRI of brain and cervical spine with and without contrast on 11/11/17, which was personally reviewed.  The brain showed stable and unchanged punctate focus of abnormal contrast enhancement in the left paramedian midbrain which was seen back in August.  It is not seen on other sequences.  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  In January, she had an elevatedt bili 1.3, AST of 52 and ALT of 69.  It may have been due to gallstones rather than Gilenya, for which she underwent surgery.  Over the next several months, her LFTs have been monitored and have steadily trended down.  Most recent hepatic panel from 04/14/18 demonstrated t bili 1, ALP 112, AST 21 and ALT 35.  Vitamin D level from 11/25/17  was 45 and she was advised to increase D3 from 5000 IU to 7000 IU daily and then to 8000 IU.  Repeat D level from 04/14/18 was 48.  CBC demonstrated WBC of 5.9 but differential was not included.  HISTORY: Initial symptom in 2012 presented with 1 week episode of vertigo with feeling off-balance.  This was followed by intermittent numbness and tingling of her hands and feet.  In November 2014, she developed double vision associated with slurred speech, right sided numbness and difficulty swallowing (the right side of her mouth, tongue and throat felt numb).  MRI of the brain with and without contrast from 07/12/13 was personally reviewed and showed hyperintense T2 lesions in the on left side of the midbrain.  MRI of cervical spine from 08/02/13 was personally reviewed and was negative for demyelinating lesions.  She underwent a lumbar puncture in January 2015.  CSF cell count was 1, protein 33, glucose 56, 3 oligoclonal bands (not present in serum), negative myelin basic protein, IgG index 0.52.  NMO antibody was negative.  Past disease modifying medication:  Tecfidera (effective but caused significant GERD).  Repeat MRI of the brain with and without contrast from 01/18/14 was reportedly normal with resolution of the previous midbrain lesion (report available but not images).  Most recent MRI of brain with and without contrast from 10/09/15 was reportedly again unremarkable and showed no significant white matter disease.  Known  6 mm pineal cyst was noted.  MRI of brain with and without contrast from 04/14/17 demonstrated punctate focus of enhancement in the location of previous left midbrain lesion, of uncertain etiology and significance.  Pineal cyst measures 7 mm.  At baseline, she had paresthesias of the right side of her face and throat.  She also has paresthesias in the upper extremities and toes, worse on the right.  Sometimes she needs to use a cane. Heat (such as during the summer months), stress and  lack of sleep exacerbate chronic symptoms.  She also has restless leg syndrome.  PAST MEDICAL HISTORY: Past Medical History:  Diagnosis Date  . Anxiety   . Arthritis   . Bulging discs 1998   cervical  . Complication of anesthesia   . Constipation   . Depression   . Dysrhythmia   . Floppy mitral valve    dx 30 yrs ago  . GERD (gastroesophageal reflux disease)   . [name] syndrome since late teens  . Headache(784.0)   . Heart palpitations   . Hemorrhoids   . Hypertension   . MS (multiple sclerosis) ([place])   . PONV (postoperative nausea and vomiting)    last couple surgeries, she has done ok    MEDICATIONS: Current Outpatient Medications on File Prior to Visit  Medication Sig Dispense Refill  . ALPRAZolam (XANAX) 0.5 MG tablet TAKE 1 TABLET BY MOUTH ONCE (1) DAILY AT BEDTIME AS NEEDED FOR ANXIETY 30 tablet 5  . Biotin 5000 MCG TABS Take 1 tablet by mouth daily.     . Cholecalciferol (VITAMIN D-3) 5000 units TABS Take 1 tablet by mouth daily. 7000 mg day    . diphenhydramine-acetaminophen (TYLENOL PM) 25-500 MG TABS tablet Take 1 tablet by mouth at bedtime as needed.    Marland Kitchen escitalopram (LEXAPRO) 10 MG tablet TAKE 1 TABLET BY MOUTH ONCE (1) DAILY 30 tablet 5  . Fingolimod HCl (GILENYA) 0.5 MG CAPS Take 1 capsule (0.5 mg total) by mouth daily. 90 capsule 3  . gabapentin (NEURONTIN) 300 MG capsule TAKE 2 CAPSULES BY MOUTH AT BEDTIME 180 capsule 1  . Melatonin 10 MG TABS Take 1 tablet by mouth daily.     . nebivolol (BYSTOLIC) 5 MG tablet Take 5 mg by mouth daily.    . Omega-3 Fatty Acids (FISH OIL) 1000 MG CAPS Take 1 capsule by mouth daily.     Marland Kitchen omeprazole (PRILOSEC) 40 MG capsule Take 1 capsule (40 mg total) by mouth daily before breakfast. 90 capsule 3  . ranitidine (ZANTAC) 150 MG tablet Take 1 tablet (150 mg total) at bedtime by mouth. 30 tablet 3  . valACYclovir (VALTREX) 500 MG tablet Take 500 mg by mouth daily as needed (cold sores).      No current facility-administered  medications on file prior to visit.     ALLERGIES: Allergies  Allergen Reactions  . Morphine Nausea And Vomiting  . Oxycodone Other (See Comments) and Nausea And Vomiting  . Latex Rash  . Prednisone Other (See Comments)    Causes extreme hypotension    FAMILY HISTORY: Family History  Problem Relation Age of Onset  . Heart disease Father   . Colonic polyp Father   . Hypertension Mother   . Diabetes Mother   . Skin cancer Mother   . Alzheimer's disease Maternal Grandmother   . Diabetes Maternal Grandmother   . Hypertension Maternal Grandmother   . Colon cancer Neg Hx     SOCIAL HISTORY: Social History  Socioeconomic History  . Marital status: Married    Spouse name: Cletus Gash   . Number of children: 3  . Years of education: 12  . Highest education level: Not on file  Occupational History  . Occupation: housewife  Social Needs  . Financial resource strain: Not on file  . Food insecurity:    Worry: Not on file    Inability: Not on file  . Transportation needs:    Medical: Not on file    Non-medical: Not on file  Tobacco Use  . Smoking status: Current Some Day Smoker    Packs/day: 0.25    Types: Cigarettes    Last attempt to quit: 09/08/1994    Years since quitting: 23.6  . Smokeless tobacco: Never Used  . Tobacco comment: husband just recently dx with cancer  Substance and Sexual Activity  . Alcohol use: No  . Drug use: No  . Sexual activity: Yes    Comment: intercourse age 28, sexual partners less than 5  Lifestyle  . Physical activity:    Days per week: Not on file    Minutes per session: Not on file  . Stress: Not on file  Relationships  . Social connections:    Talks on phone: Not on file    Gets together: Not on file    Attends religious service: Not on file    Active member of club or organization: Not on file    Attends meetings of clubs or organizations: Not on file    Relationship status: Not on file  . Intimate partner violence:    Fear of  current or ex partner: Not on file    Emotionally abused: Not on file    Physically abused: Not on file    Forced sexual activity: Not on file  Other Topics Concern  . Not on file  Social History Narrative   ** Merged History Encounter **       ** Data from: 05/25/12 Enc Dept: [facility]       ** Data from: 07/20/13 Enc Dept: [facility]   Patient is married Cletus Gash).   Patient has 3 children.    Patient works at [employer] in [place].   Patient has high school education.   Caffeine consumption two cups daily        REVIEW OF SYSTEMS: Constitutional: fatigue Eyes: No visual changes, double vision, eye pain Ear, nose and throat: No hearing loss, ear pain, nasal congestion, sore throat Cardiovascular: No chest pain, palpitations Respiratory:  No shortness of breath at rest or with exertion, wheezes GastrointestinaI: No nausea, vomiting, diarrhea, abdominal pain, fecal incontinence Genitourinary:  No dysuria, urinary retention or frequency Musculoskeletal:  No neck pain, back pain Integumentary: No rash, pruritus, skin lesions Neurological: as above Psychiatric: No depression, insomnia, anxiety Endocrine: fatigue Hematologic/Lymphatic:  No purpura, petechiae. Allergic/Immunologic: no itchy/runny eyes, nasal congestion, recent allergic reactions, rashes  PHYSICAL EXAM: Blood pressure 126/88, pulse 70, height 5\' 9"  (1.753 m), weight 211 lb (95.7 kg), SpO2 97 %. General: No acute distress.  Patient appears well-groomed.  Head:  Normocephalic/atraumatic Eyes:  Fundi examined but not visualized Neck: supple, no paraspinal tenderness, full range of motion Heart:  Regular rate and rhythm Lungs:  Clear to auscultation bilaterally Back: No paraspinal tenderness Neurological Exam: alert and oriented to person, place, and time. Attention span and concentration intact, recent and remote memory intact, fund of knowledge intact.  Speech fluent and not dysarthric, language intact.   Altered rigth V2-V3 sensation.  Otherwise, CN II-XII intact. Bulk and tone normal, muscle strength 5/5 throughout.  Sensation to pinprick reduced in left upper and lower extremities, and vibration intact.  Deep tendon reflexes 2+ throughout, toes downgoing.  Finger to nose and heel to shin testing intact.  Gait mildly wide-based with good steady stride.  Timed 25 foot walk 6.46 seconds.  Romberg positive.  IMPRESSION: Multiple sclerosis  PLAN: 1.  Continue Gilenya, gabapentin, D3 7000 IU daily 2.  Check CBC with differential, B12 and ferritin today.  Repeat CBC with diff, hepatic panel and D level in 6 months 3.  Follow up in 6 months.  25 minutes spent face to face with patient, over 50% spent discussing management.  Metta Clines, DO  CC:  Reinaldo Meeker, MD  Ricky Ala, MD

## 2018-04-26 ENCOUNTER — Ambulatory Visit: Payer: Federal, State, Local not specified - PPO | Admitting: Neurology

## 2018-04-26 ENCOUNTER — Other Ambulatory Visit (INDEPENDENT_AMBULATORY_CARE_PROVIDER_SITE_OTHER): Payer: Federal, State, Local not specified - PPO

## 2018-04-26 ENCOUNTER — Encounter: Payer: Self-pay | Admitting: Neurology

## 2018-04-26 VITALS — BP 126/88 | HR 70 | Ht 69.0 in | Wt 211.0 lb

## 2018-04-26 DIAGNOSIS — Z79899 Other long term (current) drug therapy: Secondary | ICD-10-CM

## 2018-04-26 DIAGNOSIS — G2581 Restless legs syndrome: Secondary | ICD-10-CM

## 2018-04-26 DIAGNOSIS — E559 Vitamin D deficiency, unspecified: Secondary | ICD-10-CM

## 2018-04-26 DIAGNOSIS — R6889 Other general symptoms and signs: Secondary | ICD-10-CM

## 2018-04-26 DIAGNOSIS — G35 Multiple sclerosis: Secondary | ICD-10-CM | POA: Diagnosis not present

## 2018-04-26 DIAGNOSIS — E538 Deficiency of other specified B group vitamins: Secondary | ICD-10-CM | POA: Diagnosis not present

## 2018-04-26 LAB — CBC WITH DIFFERENTIAL/PLATELET
BASOS ABS: 0 10*3/uL (ref 0.0–0.1)
Basophils Relative: 1.2 % (ref 0.0–3.0)
EOS ABS: 0.1 10*3/uL (ref 0.0–0.7)
Eosinophils Relative: 1.9 % (ref 0.0–5.0)
HCT: 42.4 % (ref 36.0–46.0)
Hemoglobin: 14.6 g/dL (ref 12.0–15.0)
LYMPHS ABS: 0.2 10*3/uL — AB (ref 0.7–4.0)
Lymphocytes Relative: 6 % — ABNORMAL LOW (ref 12.0–46.0)
MCHC: 34.4 g/dL (ref 30.0–36.0)
MCV: 90.6 fl (ref 78.0–100.0)
MONO ABS: 0.4 10*3/uL (ref 0.1–1.0)
Monocytes Relative: 11 % (ref 3.0–12.0)
NEUTROS PCT: 79.9 % — AB (ref 43.0–77.0)
Neutro Abs: 3.1 10*3/uL (ref 1.4–7.7)
Platelets: 280 10*3/uL (ref 150.0–400.0)
RBC: 4.68 Mil/uL (ref 3.87–5.11)
RDW: 12.6 % (ref 11.5–15.5)
WBC: 3.8 10*3/uL — ABNORMAL LOW (ref 4.0–10.5)

## 2018-04-26 LAB — FERRITIN: FERRITIN: 18.2 ng/mL (ref 10.0–291.0)

## 2018-04-26 LAB — VITAMIN B12: VITAMIN B 12: 323 pg/mL (ref 211–911)

## 2018-04-26 NOTE — Patient Instructions (Addendum)
1.  Continue Gilenya 2.  Increase D3 to 10,000 IU daily 3.  Check CBC with diff, B12 and ferritin level today.   4.  Check CBC with diff, hepatic panel, and vitamin D in 6 months  Your provider has requested that you have labwork completed today. Please go to [facility] ([id number]) on the second floor of this building before leaving the office today. You do not need to check in. If you are not called within 15 minutes please check with the front desk.

## 2018-04-30 LAB — HEPATIC FUNCTION PANEL
AG Ratio: 1.9 (calc) (ref 1.0–2.5)
ALBUMIN MSPROF: 4.6 g/dL (ref 3.6–5.1)
ALT: 35 U/L — AB (ref 6–29)
AST: 23 U/L (ref 10–35)
Alkaline phosphatase (APISO): 103 U/L (ref 33–130)
BILIRUBIN DIRECT: 0.2 mg/dL (ref 0.0–0.2)
Globulin: 2.4 g/dL (calc) (ref 1.9–3.7)
Indirect Bilirubin: 1.1 mg/dL (calc) (ref 0.2–1.2)
TOTAL PROTEIN: 7 g/dL (ref 6.1–8.1)
Total Bilirubin: 1.3 mg/dL — ABNORMAL HIGH (ref 0.2–1.2)

## 2018-04-30 LAB — VITAMIN D 1,25 DIHYDROXY
VITAMIN D 1, 25 (OH) TOTAL: 46 pg/mL (ref 18–72)
VITAMIN D3 1, 25 (OH): 46 pg/mL

## 2018-06-14 ENCOUNTER — Other Ambulatory Visit: Payer: Self-pay | Admitting: Neurology

## 2018-08-09 ENCOUNTER — Other Ambulatory Visit: Payer: Self-pay | Admitting: Neurology

## 2018-10-11 ENCOUNTER — Other Ambulatory Visit: Payer: Self-pay | Admitting: Neurology

## 2018-10-25 ENCOUNTER — Telehealth: Payer: Self-pay | Admitting: Neurology

## 2018-10-25 NOTE — Telephone Encounter (Signed)
Called and and advised Pt labs should be back by Thursday.

## 2018-10-25 NOTE — Telephone Encounter (Signed)
Patient forgot that she had a appt Thursday and wants to know if she comes in today for labs will we have the results back by Thursday please call

## 2018-10-26 ENCOUNTER — Other Ambulatory Visit: Payer: Self-pay

## 2018-10-26 ENCOUNTER — Other Ambulatory Visit (INDEPENDENT_AMBULATORY_CARE_PROVIDER_SITE_OTHER): Payer: Federal, State, Local not specified - PPO

## 2018-10-26 DIAGNOSIS — E559 Vitamin D deficiency, unspecified: Secondary | ICD-10-CM

## 2018-10-26 DIAGNOSIS — Z79899 Other long term (current) drug therapy: Secondary | ICD-10-CM | POA: Diagnosis not present

## 2018-10-26 LAB — CBC WITH DIFFERENTIAL/PLATELET
BASOS ABS: 0 10*3/uL (ref 0.0–0.1)
Basophils Relative: 1 % (ref 0.0–3.0)
EOS ABS: 0.2 10*3/uL (ref 0.0–0.7)
Eosinophils Relative: 3.4 % (ref 0.0–5.0)
HCT: 42.7 % (ref 36.0–46.0)
HEMOGLOBIN: 14.4 g/dL (ref 12.0–15.0)
Lymphs Abs: 0.3 10*3/uL — ABNORMAL LOW (ref 0.7–4.0)
MCHC: 33.7 g/dL (ref 30.0–36.0)
MCV: 93 fl (ref 78.0–100.0)
MONO ABS: 0.6 10*3/uL (ref 0.1–1.0)
Monocytes Relative: 12.3 % — ABNORMAL HIGH (ref 3.0–12.0)
Neutro Abs: 3.6 10*3/uL (ref 1.4–7.7)
Neutrophils Relative %: 77.6 % — ABNORMAL HIGH (ref 43.0–77.0)
Platelets: 309 10*3/uL (ref 150.0–400.0)
RBC: 4.6 Mil/uL (ref 3.87–5.11)
RDW: 13.4 % (ref 11.5–15.5)
WBC: 4.6 10*3/uL (ref 4.0–10.5)

## 2018-10-26 LAB — HEPATIC FUNCTION PANEL
ALBUMIN: 4.2 g/dL (ref 3.5–5.2)
ALT: 70 U/L — AB (ref 0–35)
AST: 34 U/L (ref 0–37)
Alkaline Phosphatase: 110 U/L (ref 39–117)
Bilirubin, Direct: 0.1 mg/dL (ref 0.0–0.3)
TOTAL PROTEIN: 7.3 g/dL (ref 6.0–8.3)
Total Bilirubin: 0.7 mg/dL (ref 0.2–1.2)

## 2018-10-26 LAB — VITAMIN D 25 HYDROXY (VIT D DEFICIENCY, FRACTURES): VITD: 95.53 ng/mL (ref 30.00–100.00)

## 2018-10-27 NOTE — Progress Notes (Signed)
NEUROLOGY FOLLOW UP OFFICE NOTE  Crystal Simon [id number]  HISTORY OF PRESENT ILLNESS: Crystal Simon is a 53 year old right-handed Caucasian woman who follows up for multiple sclerosis.  UPDATE: Current disease modifying therapy:  Gilenya Other medications: Gabapentin 600 mg at bedtime, D3 10,000 IU daily  10/26/18 LABS: CBC with WBC 4.6, Hgb 14.4, HCT 42.7, PLT 309, absolute lymphocyte count 0.3K/UL; hepatic function panel with T bili 0.7, ALP 110, AST 34, ALT 70; vitamin D 95.53  Vision: No issues Motor: No issues Sensory: Chronic sensory deficits..  Tongue is numb, which sometimes causes slurred speech.  She has tingling involving her face. Pain: she reports an intermittent pain along the right side of her torso just under the breast and radiating to the back.  It feels like a spasm and causes shortness of breath.  Sometimes it radiates up the right side of her neck and into the jaw.   Gait: Chronic disability.  Uses a cane.  Unchanged. Bowel/Bladder: Irritable bowel syndrome. Fatigue: Yes Cognition: No issues Mood: No issues  HISTORY: Initial symptom in 2012 presented with 1 week episode of vertigo with feeling off-balance. This was followed by intermittent numbness and tingling of her hands and feet. In November 2014, she developed double vision associated with slurred speech, right sided numbness and difficulty swallowing (the right side of her mouth, tongue and throat felt numb). MRI of the brain with and without contrast from 07/12/13 was personally reviewed and showed hyperintense T2 lesions in the on left side of the midbrain. MRI of cervical spine from 08/02/13 was personally reviewed and was negative for demyelinating lesions. She underwent a lumbar puncture in January 2015. CSF cell count was 1, protein 33, glucose 56, 3 oligoclonal bands (not present in serum), negative myelin basic protein, IgG index 0.52. NMO antibody was negative.  Past disease modifying  medication: Tecfidera (effective but caused significant GERD).  Repeat MRI of the brain with and without contrast from 01/18/14 was reportedly normal with resolution of the previous midbrain lesion (report available but not images). Most recent MRI of brain with and without contrast from 10/09/15 was reportedly again unremarkable and showed no significant white matter disease. Known 6 mm pineal cyst was noted.  MRI of brain with and without contrast from 04/14/17 demonstrated punctate focus of enhancement in the location of previous left midbrain lesion, of uncertain etiology and significance. Pineal cyst measures 7 mm.  At baseline, she had paresthesias of the right side of her face and throat. She also has paresthesias in the upper extremities and toes, worse on the right. Sometimes she needs to use a cane. Heat (such as during the summer months), stress and lack of sleep exacerbate chronic symptoms. She also has restless leg syndrome.  In January, she had an elevatedt bili 1.3, AST of 52 and ALT of 69.  It may have been due to gallstones rather than Gilenya, for which she underwent surgery.  Over the next several months, her LFTs have been monitored and have steadily trended down.  Hepatic panel from 04/14/18 demonstrated t bili 1, ALP 112, AST 21 and ALT 35.  In February 2019, she reported some headaches, a shooting pain radiating from the back of the head to the eye, usually right sided but also left sided as well. It lasts only a few seconds and occurs two to three times a week. She reported some neck pain.  She also endorsed weakness and postural tremor in the hands.  Due to these new symptoms,  she underwent repeat MRI of brain and cervical spine with and without contrast on 11/11/17, which was personally reviewed.  The brain showed stable and unchanged punctate focus of abnormal contrast enhancement in the left paramedian midbrain which was seen back in August.  It is not seen on other sequences.   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  PAST MEDICAL HISTORY: Past Medical History:  Diagnosis Date  . Anxiety   . Arthritis   . Bulging discs 1998   cervical  . Complication of anesthesia   . Constipation   . Depression   . Dysrhythmia   . Floppy mitral valve    dx 30 yrs ago  . GERD (gastroesophageal reflux disease)   . [name] syndrome since late teens  . Headache(784.0)   . Heart palpitations   . Hemorrhoids   . Hypertension   . MS (multiple sclerosis) ([place])   . PONV (postoperative nausea and vomiting)    last couple surgeries, she has done ok    MEDICATIONS: Current Outpatient Medications on File Prior to Visit  Medication Sig Dispense Refill  . ALPRAZolam (XANAX) 0.5 MG tablet TAKE 1 TABLET BY MOUTH ONCE (1) DAILY AT BEDTIME AS NEEDED FOR ANXIETY 30 tablet 5  . Biotin 5000 MCG TABS Take 1 tablet by mouth daily.     . Cholecalciferol (VITAMIN D-3) 5000 units TABS Take 1 tablet by mouth daily. 7000 mg day    . diphenhydramine-acetaminophen (TYLENOL PM) 25-500 MG TABS tablet Take 1 tablet by mouth at bedtime as needed.    Marland Kitchen escitalopram (LEXAPRO) 10 MG tablet TAKE 1 TABLET BY MOUTH ONCE (1) DAILY 30 tablet 5  . gabapentin (NEURONTIN) 300 MG capsule TAKE 2 CAPSULES BY MOUTH AT BEDTIME 180 capsule 1  . GILENYA 0.5 MG CAPS TAKE ONE CAPSULE (0.5 MG TOTAL) BY MOUTH DAILY 90 capsule 3  . Melatonin 10 MG TABS Take 1 tablet by mouth daily.     . nebivolol (BYSTOLIC) 5 MG tablet Take 5 mg by mouth daily.    . Omega-3 Fatty Acids (FISH OIL) 1000 MG CAPS Take 1 capsule by mouth daily.     Marland Kitchen omeprazole (PRILOSEC) 40 MG capsule Take 1 capsule (40 mg total) by mouth daily before breakfast. 90 capsule 3  . ranitidine (ZANTAC) 150 MG tablet Take 1 tablet (150 mg total) at bedtime by mouth. 30 tablet 3  . valACYclovir (VALTREX) 500 MG tablet Take 500 mg by mouth daily as needed (cold sores).      No current  facility-administered medications on file prior to visit.     ALLERGIES: Allergies  Allergen Reactions  . Morphine Nausea And Vomiting  . Oxycodone Other (See Comments) and Nausea And Vomiting  . Latex Rash  . Prednisone Other (See Comments)    Causes extreme hypotension    FAMILY HISTORY: Family History  Problem Relation Age of Onset  . Heart disease Father   . Colonic polyp Father   . Hypertension Mother   . Diabetes Mother   . Skin cancer Mother   . Alzheimer's disease Maternal Grandmother   . Diabetes Maternal Grandmother   . Hypertension Maternal Grandmother   . Colon cancer Neg Hx    SOCIAL HISTORY: Social History   Socioeconomic History  . Marital status: Married    Spouse name: Cletus Gash   . Number of children: 3  . Years of education: 57  . Highest education level: Not on file  Occupational History  . Occupation: housewife  Social Needs  . Financial resource strain: Not on file  . Food insecurity:    Worry: Not on file    Inability: Not on file  . Transportation needs:    Medical: Not on file    Non-medical: Not on file  Tobacco Use  . Smoking status: Current Some Day Smoker    Packs/day: 0.25    Types: Cigarettes    Last attempt to quit: 09/08/1994    Years since quitting: 24.1  . Smokeless tobacco: Never Used  . Tobacco comment: husband just recently dx with cancer  Substance and Sexual Activity  . Alcohol use: No  . Drug use: No  . Sexual activity: Yes    Comment: intercourse age 71, sexual partners less than 5  Lifestyle  . Physical activity:    Days per week: Not on file    Minutes per session: Not on file  . Stress: Not on file  Relationships  . Social connections:    Talks on phone: Not on file    Gets together: Not on file    Attends religious service: Not on file    Active member of club or organization: Not on file    Attends meetings of clubs or organizations: Not on file    Relationship status: Not on file  . Intimate partner  violence:    Fear of current or ex partner: Not on file    Emotionally abused: Not on file    Physically abused: Not on file    Forced sexual activity: Not on file  Other Topics Concern  . Not on file  Social History Narrative   ** Merged History Encounter **       ** Data from: 05/25/12 Enc Dept: [facility]       ** Data from: 07/20/13 Enc Dept: [facility]   Patient is married Cletus Gash).   Patient has 3 children.    Patient works at [employer] in [place].   Patient has high school education.   Caffeine consumption two cups daily        REVIEW OF SYSTEMS: Constitutional: No fevers, chills, or sweats, no generalized fatigue, change in appetite Eyes: No visual changes, double vision, eye pain Ear, nose and throat: No hearing loss, ear pain, nasal congestion, sore throat Cardiovascular: No chest pain, palpitations Respiratory:  No shortness of breath at rest or with exertion, wheezes GastrointestinaI: No nausea, vomiting, diarrhea, abdominal pain, fecal incontinence Genitourinary:  No dysuria, urinary retention or frequency Musculoskeletal:  No neck pain, back pain Integumentary: No rash, pruritus, skin lesions Neurological: as above Psychiatric: No depression, insomnia, anxiety Endocrine: No palpitations, fatigue, diaphoresis, mood swings, change in appetite, change in weight, increased thirst Hematologic/Lymphatic:  No purpura, petechiae. Allergic/Immunologic: no itchy/runny eyes, nasal congestion, recent allergic reactions, rashes  PHYSICAL EXAM: Blood pressure 126/88, pulse 67, height 5\' 9"  (1.753 m), weight 218 lb (98.9 kg), SpO2 98 %. General: No acute distress.  Patient appears well-groomed.   Head:  Normocephalic/atraumatic Eyes:  Fundi examined but not visualized Neck: supple, no paraspinal tenderness, full range of motion Heart:  Regular rate and rhythm Lungs:  Clear to auscultation bilaterally Back: No paraspinal tenderness Neurological Exam: Alert and  oriented to person, place, and time.  Attention span and concentration intact.  Recent and remote memory intact.  Fund of knowledge intact.  Speech fluent and not dysarthric.  Language intact.  Altered right V2-V3 sensation.  Otherwise CN II-XII intact.  Bulk and  tone normal.  Muscle strength 5/5 throughout.  Sensation to pinprick reduced in hands and right lower extremity.  Vibration sensation reduced in right hand.  Deep tendon reflexes 2+ throughout, toes downgoing.  Finger-to-nose and heel-to-shin testing intact.  Ambulates with a limp with cane.  Romberg positive.  IMPRESSION: 1.  Multiple sclerosis 2.  Elevated ALT.  Other enzymes/total bili are normal.  Not significantly elevated.  Will continue to monitor. 3.  Fatigue  PLAN: 1.  Gilenya 2.  Continue gabapentin and D3 10,000 IUs daily 3.  For fatigue, we will start Provigil 100 mg in the morning 4.  Repeat hepatic panel in 2 months. 5.  Repeat CBC with differential, hepatic panel, and D3 in 6 months. 6.  Repeat MRI of the brain with and without contrast in 6 months. 7.  Follow-up in 6 months after repeat labs and MRI.  25 minutes spent face to face with patient, over 50% spent discussing management.  Metta Clines, DO  CC: Reinaldo Meeker, MD

## 2018-10-28 ENCOUNTER — Ambulatory Visit: Payer: Federal, State, Local not specified - PPO | Admitting: Neurology

## 2018-10-28 ENCOUNTER — Encounter: Payer: Self-pay | Admitting: Neurology

## 2018-10-28 VITALS — BP 126/88 | HR 67 | Ht 69.0 in | Wt 218.0 lb

## 2018-10-28 DIAGNOSIS — R5383 Other fatigue: Secondary | ICD-10-CM

## 2018-10-28 DIAGNOSIS — R748 Abnormal levels of other serum enzymes: Secondary | ICD-10-CM

## 2018-10-28 DIAGNOSIS — E559 Vitamin D deficiency, unspecified: Secondary | ICD-10-CM | POA: Diagnosis not present

## 2018-10-28 DIAGNOSIS — Z79899 Other long term (current) drug therapy: Secondary | ICD-10-CM | POA: Diagnosis not present

## 2018-10-28 DIAGNOSIS — G35 Multiple sclerosis: Secondary | ICD-10-CM | POA: Diagnosis not present

## 2018-10-28 MED ORDER — MODAFINIL 100 MG PO TABS: 100.0000 mg | ORAL_TABLET | Freq: Every day | ORAL | 5 refills | Status: DC

## 2018-10-28 NOTE — Patient Instructions (Addendum)
1.  Continue Gilenya, gabapentin, D3 10,000 IU daily 2.  For fatigue, Provigil 200mg  in morning 3.  Repeat LFTs in 2 months, 12/27/2018 4.  Repeat CBC with diff, LFTs, D3 in 6 months  5.  Repeat MRI of brain with and without contrast in 6 months. 6.  Follow up in 6 months after repeat labs and MRI.  We have sent a referral to [facility] for your MRI and they will call you directly to schedule your appointment for August. They are located at [address] [place]. If you need to contact them directly please call [phone].

## 2018-11-23 ENCOUNTER — Telehealth: Payer: Self-pay | Admitting: Neurology

## 2018-11-23 MED ORDER — FINGOLIMOD HCL 0.5 MG PO CAPS: 0.5000 mg | ORAL_CAPSULE | Freq: Every day | ORAL | 3 refills | Status: DC

## 2018-11-23 NOTE — Telephone Encounter (Signed)
Called and advised Pt her Gilenya has been approved through 11/23/19, I sent a new Rx to [facility]

## 2018-11-23 NOTE — Progress Notes (Signed)
Called [phone], spoke with Vicente Males, gave her clinicals. Gilenya approved through 11/23/19

## 2018-11-23 NOTE — Telephone Encounter (Signed)
Patient called regarding her MS medication Gilenya and she has only 9 pills left. She needs our office to call [phone] and press Option 2. She uses [facility]. She said they told her to make it a STAT Request. Please Call. Thanks

## 2019-02-18 ENCOUNTER — Encounter: Payer: Federal, State, Local not specified - PPO | Admitting: Gynecology

## 2019-02-23 ENCOUNTER — Telehealth: Payer: Self-pay | Admitting: Neurology

## 2019-02-23 NOTE — Telephone Encounter (Signed)
[place], [place] [phone] (Phone)   Called to confirm pt phone numbers on acct due to failed attempts to reach pt concerning refills. No FU needed at this time.

## 2019-04-06 ENCOUNTER — Encounter: Payer: Federal, State, Local not specified - PPO | Admitting: Gynecology

## 2019-04-09 ENCOUNTER — Other Ambulatory Visit: Payer: Self-pay | Admitting: Neurology

## 2019-04-11 ENCOUNTER — Ambulatory Visit
Admission: RE | Admit: 2019-04-11 | Discharge: 2019-04-11 | Disposition: A | Discharge status: Home / Self Care | Payer: Federal, State, Local not specified - PPO | Source: Ambulatory Visit | Attending: Neurology | Admitting: Neurology

## 2019-04-11 ENCOUNTER — Other Ambulatory Visit: Payer: Self-pay

## 2019-04-11 DIAGNOSIS — G35 Multiple sclerosis: Secondary | ICD-10-CM

## 2019-04-11 MED ORDER — GADOBENATE DIMEGLUMINE 529 MG/ML IV SOLN
20.0000 mL | Freq: Once | INTRAVENOUS | Status: AC | PRN
  Administered 2019-04-11: 20 mL via INTRAVENOUS

## 2019-04-12 ENCOUNTER — Telehealth: Payer: Self-pay

## 2019-04-12 NOTE — Telephone Encounter (Signed)
-----   Message from Pieter Partridge, DO sent at 04/12/2019  7:51 AM EDT ----- MRI of brain looks stable compared to last MRI from 1 1/2 years ago.

## 2019-04-12 NOTE — Telephone Encounter (Signed)
Called and advised Pt of MRI results

## 2019-04-29 ENCOUNTER — Ambulatory Visit: Payer: Federal, State, Local not specified - PPO | Admitting: Neurology

## 2019-05-06 ENCOUNTER — Encounter: Payer: Federal, State, Local not specified - PPO | Admitting: Gynecology

## 2019-05-25 ENCOUNTER — Encounter: Payer: Federal, State, Local not specified - PPO | Admitting: Gynecology

## 2019-05-25 ENCOUNTER — Encounter: Payer: Self-pay | Admitting: Neurology

## 2019-05-30 NOTE — Progress Notes (Signed)
NEUROLOGY FOLLOW UP OFFICE NOTE  Crystal Simon [id number]  HISTORY OF PRESENT ILLNESS: Crystal Simon is a 53 year old right-handed Caucasian woman who follows up for multiple sclerosis.  UPDATE: Current disease modifying therapy:  Gilenya Other medications: Gabapentin 600 mg at bedtime, Lexapro 10mg ; D3 10,000 IU daily  MRI of brain with and without contrast from 04/11/19 personally reviewed and is stable, again noted unchanged punctate focus of enhancement in the midbrain without associated T2 signal abnormality.  Incidental 8 mm pineal cyst unchanged.  Otherwise, brain unremarkable.  No recent labs.  Vision: No issues Motor: No issues Sensory: Her feet have been more ticklish.  Chronic sensory deficits..  Tongue is numb, which sometimes causes slurred speech.  She has tingling involving her face. Pain: Increased muscle spasms and aching in the legs.  She sometimes notes an intermittent pain along the right side of her torso just under the breast and radiating to the back.  It feels like a spasm and causes shortness of breath.  Sometimes it radiates up the right side of her neck and into the jaw.  She has bilateral suboccipital pressure-like headaches that sometimes involves the right eye.  She has neck pain.  More recently, they have been occurring once a week.   Gait: Chronic disability.  Uses a cane.  Unchanged. Bowel/Bladder: Irritable bowel syndrome. Fatigue: Yes.  It greatly affects her quality of life.  It is hard to sometimes function.  Her insurance would not approve Provigil.   Cognition: No issues Mood: No issues  HISTORY: Initial symptom in 2012 presented with 1 week episode of vertigo with feeling off-balance. This was followed by intermittent numbness and tingling of her hands and feet. In November 2014, she developed double vision associated with slurred speech, right sided numbness and difficulty swallowing (the right side of her mouth, tongue and throat felt numb).  MRI of the brain with and without contrast from 07/12/13 was personally reviewed and showed hyperintense T2 lesions in the on left side of the midbrain. MRI of cervical spine from 08/02/13 was personally reviewed and was negative for demyelinating lesions. She underwent a lumbar puncture in January 2015. CSF cell count was 1, protein 33, glucose 56, 3 oligoclonal bands (not present in serum), negative myelin basic protein, IgG index 0.52. NMO antibody was negative.  Past disease modifying medication: Tecfidera (effective but caused significant GERD).  Repeat MRI of the brain with and without contrast from 01/18/14 was reportedly normal with resolution of the previous midbrain lesion (report available but not images). Most recent MRI of brain with and without contrast from 10/09/15 was reportedly again unremarkable and showed no significant white matter disease. Known 6 mm pineal cyst was noted. MRI of brain with and without contrast from 04/14/17 demonstrated punctate focus of enhancement in the location of previous left midbrain lesion, of uncertain etiology and significance. Pineal cyst measures 7 mm.  At baseline, she had paresthesias of the right side of her face and throat. She also has paresthesias in the upper extremities and toes, worse on the right. Sometimes she needs to use a cane. Heat (such as during the summer months), stress and lack of sleep exacerbate chronic symptoms. She also has restless leg syndrome.  In January, she had an elevatedt bili 1.3, AST of 52 and ALT of 69. It may have been due to gallstones rather than Gilenya, for which she underwent surgery. Over the next several months, her LFTs have been monitored and have steadily trended down. Hepatic panel  from 04/14/18 demonstrated t bili 1, ALP 112, AST 21 and ALT 35.  In February 2019, she reported some headaches, a shooting pain radiating from the back of the head to the eye, usually right sided but also left sided  as well. It lasts only a few seconds and occurs two to three times a week. She reported some neck pain. She also endorsed weakness and postural tremor in the hands. Due to these new symptoms, she underwent repeat MRI of brain and cervical spine with and without contrast on 11/11/17, which was personally reviewed. The brain showed stable and unchanged punctate focus of abnormal contrast enhancement in the left paramedian midbrain which was seen back in August. It is not seen on other sequences. 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  PAST MEDICAL HISTORY: Past Medical History:  Diagnosis Date   Anxiety    Arthritis    Bulging discs 1998   cervical   Complication of anesthesia    Constipation    Depression    Dysrhythmia    Floppy mitral valve    dx 30 yrs ago   GERD (gastroesophageal reflux disease)    Gilbert syndrome since late teens   Headache(784.0)    Heart palpitations    Hemorrhoids    Hypertension    MS (multiple sclerosis) (HCC)    PONV (postoperative nausea and vomiting)    last couple surgeries, she has done ok    MEDICATIONS: Current Outpatient Medications on File Prior to Visit  Medication Sig Dispense Refill   ALPRAZolam (XANAX) 0.5 MG tablet TAKE 1 TABLET BY MOUTH ONCE (1) DAILY AT BEDTIME AS NEEDED FOR ANXIETY 30 tablet 5   Biotin 5000 MCG TABS Take 1 tablet by mouth daily.      Cholecalciferol (VITAMIN D-3) 5000 units TABS Take 1 tablet by mouth daily. 7000 mg day     Dexlansoprazole (DEXILANT PO) Take by mouth.     diphenhydramine-acetaminophen (TYLENOL PM) 25-500 MG TABS tablet Take 1 tablet by mouth at bedtime as needed.     escitalopram (LEXAPRO) 10 MG tablet TAKE 1 TABLET BY MOUTH ONCE (1) DAILY 30 tablet 5   Fingolimod HCl (GILENYA) 0.5 MG CAPS Take 1 capsule (0.5 mg total) by mouth daily. 90 capsule 3   gabapentin (NEURONTIN) 300 MG capsule TAKE 2 CAPSULES BY MOUTH  AT BEDTIME 180 capsule 1   GILENYA 0.5 MG CAPS TAKE ONE CAPSULE (0.5 MG TOTAL) BY MOUTH DAILY 90 capsule 3   hydrochlorothiazide (HYDRODIURIL) 25 MG tablet Take 25 mg by mouth daily.     Melatonin 10 MG TABS Take 1 tablet by mouth daily.      metoprolol succinate (TOPROL-XL) 25 MG 24 hr tablet Take 50 mg by mouth daily.     modafinil (PROVIGIL) 100 MG tablet Take 1 tablet (100 mg total) by mouth daily. 30 tablet 5   nebivolol (BYSTOLIC) 5 MG tablet Take 5 mg by mouth daily.     Omega-3 Fatty Acids (FISH OIL) 1000 MG CAPS Take 1 capsule by mouth daily.      valACYclovir (VALTREX) 500 MG tablet Take 500 mg by mouth daily as needed (cold sores).      No current facility-administered medications on file prior to visit.     ALLERGIES: Allergies  Allergen Reactions   Morphine Nausea And Vomiting   Oxycodone Other (See Comments) and Nausea And Vomiting   Latex Rash   Prednisone Other (See Comments)  Causes extreme hypotension    FAMILY HISTORY: Family History  Problem Relation Age of Onset   Heart disease Father    Colonic polyp Father    Hypertension Mother    Diabetes Mother    Skin cancer Mother    Alzheimer's disease Maternal Grandmother    Diabetes Maternal Grandmother    Hypertension Maternal Grandmother    Colon cancer Neg Hx    SOCIAL HISTORY: Social History   Socioeconomic History   Marital status: Married    Spouse name: Cletus Gash    Number of children: 3   Years of education: 12   Highest education level: Not on file  Occupational History   Occupation: housewife  Social Designer, fashion/clothing strain: Not on file   Food insecurity    Worry: Not on file    Inability: Not on file   Transportation needs    Medical: Not on file    Non-medical: Not on file  Tobacco Use   Smoking status: Current Some Day Smoker    Packs/day: 0.25    Types: Cigarettes    Last attempt to quit: 09/08/1994    Years since quitting: 24.7   Smokeless  tobacco: Never Used   Tobacco comment: husband just recently dx with cancer  Substance and Sexual Activity   Alcohol use: No   Drug use: No   Sexual activity: Yes    Comment: intercourse age 14, sexual partners less than 5  Lifestyle   Physical activity    Days per week: Not on file    Minutes per session: Not on file   Stress: Not on file  Relationships   Social connections    Talks on phone: Not on file    Gets together: Not on file    Attends religious service: Not on file    Active member of club or organization: Not on file    Attends meetings of clubs or organizations: Not on file    Relationship status: Not on file   Intimate partner violence    Fear of current or ex partner: Not on file    Emotionally abused: Not on file    Physically abused: Not on file    Forced sexual activity: Not on file  Other Topics Concern   Not on file  Social History Narrative   ** Merged History Encounter **       ** Data from: 05/25/12 Enc Dept: [facility]       ** Data from: 07/20/13 Enc Dept: [facility]   Patient is married Cletus Gash).   Patient has 3 children.    Patient works at [employer] in [place].   Patient has high school education.   Caffeine consumption two cups daily        REVIEW OF SYSTEMS: Constitutional: fatigue Eyes: No visual changes, double vision, eye pain Ear, nose and throat: No hearing loss, ear pain, nasal congestion, sore throat Cardiovascular: No chest pain, palpitations Respiratory:  No shortness of breath at rest or with exertion, wheezes GastrointestinaI: as above Genitourinary:  No dysuria, urinary retention or frequency Musculoskeletal:  As above Integumentary: No rash, pruritus, skin lesions Neurological: as above Psychiatric: No depression, insomnia, anxiety Endocrine: fatigue Hematologic/Lymphatic:  No purpura, petechiae. Allergic/Immunologic: no itchy/runny eyes, nasal congestion, recent allergic reactions, rashes  PHYSICAL  EXAM: Blood pressure 136/82, pulse 78, height 5\' 9"  (1.753 m), weight 220 lb (99.8 kg), SpO2 99 %. General: No acute distress.  Patient appears well-groomed.   Head:  Normocephalic/atraumatic  Eyes:  Fundi examined but not visualized Neck: supple, no paraspinal tenderness, full range of motion Heart:  Regular rate and rhythm Lungs:  Clear to auscultation bilaterally Back: No paraspinal tenderness Neurological Exam: alert and oriented to person, place, and time. Attention span and concentration intact, recent and remote memory intact, fund of knowledge intact.  Speech fluent and not dysarthric, language intact.  Altered right V2-V3 sensation.  Otherwise, CN II-XII intact. Bulk and tone normal, muscle strength 5/5 throughout.  Sensation to pinprick reduced in hands and right lower extremity.  Vibration sensation reduced in left foot.  Deep tendon reflexes 2+ throughout, toes downgoing.  Finger to nose and heel to shin testing intact.  Ambulates with limp; Romberg positive.  IMPRESSION: Multiple sclerosis.    PLAN: 1.  Gilenya 2.  To help with pain, muscle spasms, cervicalgia and headache, will increase gabapentin to 300mg  in AM and 600mg  in PM.  In 4 weeks, we can increase dose to 600mg  twice daily if needed. 3.  Will reorder Provigil and appeal denial 4.  D3 10,000 IU daily 5.  Repeat CBC with diff, CMP and vitamin D level today and again in 6 months. 6.  Discussed routine skin exams and mammograms. 7.  Follow up in 6 months (about a week after repeat labs)  25 minutes spent face to face with patient, over 50% spent discussing management.  Metta Clines, DO  CC: Reinaldo Meeker, MD

## 2019-05-31 ENCOUNTER — Other Ambulatory Visit: Payer: Self-pay

## 2019-05-31 ENCOUNTER — Other Ambulatory Visit (INDEPENDENT_AMBULATORY_CARE_PROVIDER_SITE_OTHER): Payer: Federal, State, Local not specified - PPO

## 2019-05-31 ENCOUNTER — Ambulatory Visit: Payer: Federal, State, Local not specified - PPO | Admitting: Neurology

## 2019-05-31 ENCOUNTER — Encounter: Payer: Self-pay | Admitting: Neurology

## 2019-05-31 VITALS — BP 136/82 | HR 78 | Ht 69.0 in | Wt 220.0 lb

## 2019-05-31 DIAGNOSIS — R748 Abnormal levels of other serum enzymes: Secondary | ICD-10-CM

## 2019-05-31 DIAGNOSIS — Z79899 Other long term (current) drug therapy: Secondary | ICD-10-CM

## 2019-05-31 DIAGNOSIS — G35 Multiple sclerosis: Secondary | ICD-10-CM | POA: Diagnosis not present

## 2019-05-31 DIAGNOSIS — M542 Cervicalgia: Secondary | ICD-10-CM

## 2019-05-31 DIAGNOSIS — G44219 Episodic tension-type headache, not intractable: Secondary | ICD-10-CM | POA: Diagnosis not present

## 2019-05-31 DIAGNOSIS — M62838 Other muscle spasm: Secondary | ICD-10-CM | POA: Diagnosis not present

## 2019-05-31 DIAGNOSIS — E559 Vitamin D deficiency, unspecified: Secondary | ICD-10-CM

## 2019-05-31 DIAGNOSIS — R5383 Other fatigue: Secondary | ICD-10-CM

## 2019-05-31 LAB — VITAMIN D 25 HYDROXY (VIT D DEFICIENCY, FRACTURES): VITD: >120 ng/mL

## 2019-05-31 LAB — HEPATIC FUNCTION PANEL
ALT: 38 U/L — ABNORMAL HIGH (ref 0–35)
AST: 25 U/L (ref 0–37)
Albumin: 4.2 g/dL (ref 3.5–5.2)
Alkaline Phosphatase: 105 U/L (ref 39–117)
Bilirubin, Direct: 0.1 mg/dL (ref 0.0–0.3)
Total Bilirubin: 0.9 mg/dL (ref 0.2–1.2)
Total Protein: 7 g/dL (ref 6.0–8.3)

## 2019-05-31 LAB — CBC WITH DIFFERENTIAL/PLATELET
Basophils Absolute: 0 10*3/uL (ref 0.0–0.1)
Basophils Relative: 0.7 % (ref 0.0–3.0)
Eosinophils Absolute: 0.1 10*3/uL (ref 0.0–0.7)
Eosinophils Relative: 3.4 % (ref 0.0–5.0)
HCT: 41.1 % (ref 36.0–46.0)
Hemoglobin: 13.9 g/dL (ref 12.0–15.0)
Lymphocytes Relative: 6.5 % — ABNORMAL LOW (ref 12.0–46.0)
Lymphs Abs: 0.3 10*3/uL — ABNORMAL LOW (ref 0.7–4.0)
MCHC: 33.9 g/dL (ref 30.0–36.0)
MCV: 91.9 fl (ref 78.0–100.0)
Monocytes Absolute: 0.4 10*3/uL (ref 0.1–1.0)
Monocytes Relative: 10.1 % (ref 3.0–12.0)
Neutro Abs: 3.2 10*3/uL (ref 1.4–7.7)
Neutrophils Relative %: 79.3 % — ABNORMAL HIGH (ref 43.0–77.0)
Platelets: 271 10*3/uL (ref 150.0–400.0)
RBC: 4.47 Mil/uL (ref 3.87–5.11)
RDW: 12.8 % (ref 11.5–15.5)
WBC: 4 10*3/uL (ref 4.0–10.5)

## 2019-05-31 MED ORDER — MODAFINIL 100 MG PO TABS: 100.0000 mg | ORAL_TABLET | Freq: Every day | ORAL | 5 refills | Status: DC

## 2019-05-31 MED ORDER — GABAPENTIN 300 MG PO CAPS: ORAL_CAPSULE | ORAL | 5 refills | Status: DC

## 2019-05-31 NOTE — Patient Instructions (Addendum)
1.  We will check CBC with diff, CMP, and vitamin D level today and again in 6 months. 2.  Increase gabapentin to 300mg  in AM and 600mg  in PM.  If pain not improved by end of prescription, contact me and we can increase dose. 3.  Follow up in 6 months (about a week after repeat labs) 4.  Continue Gilenya and D3 10,000 IU daily

## 2019-06-01 ENCOUNTER — Telehealth: Payer: Self-pay | Admitting: Neurology

## 2019-06-01 ENCOUNTER — Telehealth: Payer: Self-pay | Admitting: *Deleted

## 2019-06-01 ENCOUNTER — Encounter: Payer: Self-pay | Admitting: Gynecology

## 2019-06-01 DIAGNOSIS — E559 Vitamin D deficiency, unspecified: Secondary | ICD-10-CM

## 2019-06-01 NOTE — Telephone Encounter (Signed)
Patient called and left a message stating her medication needs prior authorization. I returned her call and left a message to call back with the name of the medication.

## 2019-06-01 NOTE — Telephone Encounter (Signed)
Opened in error

## 2019-06-01 NOTE — Telephone Encounter (Signed)
Called patient and made her aware to decrease to 5000 IU of Vit D as her level was elevated. Informed her MD stated other labs were okay.   She stated she had been taking 10000 IU so she will now take 5000 IU. Order put in for labs (Vit D) the week prior to her 11/30/19 appt. Patient aware to come to our office and check in for labs and then will go to [place] to have drawn.

## 2019-06-01 NOTE — Telephone Encounter (Signed)
-----   Message from Pieter Partridge, DO sent at 06/01/2019  7:28 AM EDT ----- Labs look okay.  Her vitamin D level is more elevated than I like.  I would like her to cut it down to 5000 IU daily and repeat level about a week prior to next visit in 6 months.

## 2019-06-03 ENCOUNTER — Encounter: Payer: Self-pay | Admitting: *Deleted

## 2019-06-03 NOTE — Progress Notes (Addendum)
[facility] [place] [place]: [id number] - PA Case ID: [id number] - Rx #[id number] Need help? Call us at [phone] Outcome Approvedtoday Your PA request has been approved. Additional information will be provided in the approval communication. (Message 1145) Drug Modafinil 100MG  tablets Form FEP Electronic PA Form (NCPDP) Original Claim Info 56 FOR OVERRIDE HAVE MD CALL [phone] AUTHORIZATION  Message: The Prior Authorization request has been approved for Modafinil 100MG  OR TABS. The authorization is valid from 05/04/2019 through 06/02/2020. A letter of explanation will also be mailed to the patient.

## 2019-06-10 NOTE — Telephone Encounter (Signed)
Sending to clinical staff for review: Okay to sign/close encounter or is further follow up needed? ° °

## 2019-09-29 ENCOUNTER — Telehealth: Payer: Self-pay | Admitting: Neurology

## 2019-09-29 NOTE — Telephone Encounter (Signed)
Please advise on Monday when you return, thanks

## 2019-09-29 NOTE — Telephone Encounter (Signed)
Patient called wanting to ask Dr. Tomi Likens if she should get the COVID vaccine due to her having MS? Please Call. Thank you

## 2019-09-29 NOTE — Telephone Encounter (Signed)
Yes, it is okay for her to get the vaccine.

## 2019-09-30 NOTE — Telephone Encounter (Signed)
Left message on voicemail.

## 2019-11-23 ENCOUNTER — Other Ambulatory Visit: Payer: Self-pay

## 2019-11-23 ENCOUNTER — Other Ambulatory Visit: Payer: Federal, State, Local not specified - PPO

## 2019-11-23 DIAGNOSIS — E559 Vitamin D deficiency, unspecified: Secondary | ICD-10-CM

## 2019-11-23 DIAGNOSIS — G35 Multiple sclerosis: Secondary | ICD-10-CM

## 2019-11-24 LAB — CBC WITH DIFFERENTIAL
Basophils Absolute: 0 10*3/uL (ref 0.0–0.2)
Basos: 1 %
EOS (ABSOLUTE): 0.1 10*3/uL (ref 0.0–0.4)
Eos: 1 %
Hematocrit: 42.1 % (ref 34.0–46.6)
Hemoglobin: 14.9 g/dL (ref 11.1–15.9)
Immature Grans (Abs): 0 10*3/uL (ref 0.0–0.1)
Immature Granulocytes: 1 %
Lymphocytes Absolute: 0.4 10*3/uL — ABNORMAL LOW (ref 0.7–3.1)
Lymphs: 7 %
MCH: 32 pg (ref 26.6–33.0)
MCHC: 35.4 g/dL (ref 31.5–35.7)
MCV: 91 fL (ref 79–97)
Monocytes Absolute: 0.4 10*3/uL (ref 0.1–0.9)
Monocytes: 7 %
Neutrophils Absolute: 4.3 10*3/uL (ref 1.4–7.0)
Neutrophils: 83 %
RBC: 4.65 x10E6/uL (ref 3.77–5.28)
RDW: 12.3 % (ref 11.7–15.4)
WBC: 5.1 10*3/uL (ref 3.4–10.8)

## 2019-11-24 LAB — HEPATIC FUNCTION PANEL
AG Ratio: 1.7 (calc) (ref 1.0–2.5)
ALT: 58 U/L — ABNORMAL HIGH (ref 6–29)
AST: 44 U/L — ABNORMAL HIGH (ref 10–35)
Albumin: 4.5 g/dL (ref 3.6–5.1)
Alkaline phosphatase (APISO): 110 U/L (ref 37–153)
Bilirubin, Direct: 0.3 mg/dL — ABNORMAL HIGH (ref 0.0–0.2)
Globulin: 2.6 g/dL (calc) (ref 1.9–3.7)
Indirect Bilirubin: 1.1 mg/dL (calc) (ref 0.2–1.2)
Total Bilirubin: 1.4 mg/dL — ABNORMAL HIGH (ref 0.2–1.2)
Total Protein: 7.1 g/dL (ref 6.1–8.1)

## 2019-11-24 LAB — VITAMIN D 25 HYDROXY (VIT D DEFICIENCY, FRACTURES): Vit D, 25-Hydroxy: 59 ng/mL (ref 30–100)

## 2019-11-25 ENCOUNTER — Encounter: Payer: Self-pay | Admitting: Neurology

## 2019-11-25 NOTE — Progress Notes (Signed)
Crystal Simon Key: [id number] help? Call us at [phone] Status Sent to [place] 0.5MG  capsules Form FEP Electronic PA Form (NCPDP)

## 2019-11-28 ENCOUNTER — Telehealth: Payer: Self-pay | Admitting: Neurology

## 2019-11-28 NOTE — Telephone Encounter (Signed)
Patient advised to contact pharmacy. Prior authorization pending per notes

## 2019-11-28 NOTE — Telephone Encounter (Signed)
Pt needs her year supply of the Gilenya sent in to the specality pharmacy with [facility] [phone]) . She has 3 pills left and will be out of medication. She states that if we do this Today they will over night it to her.  She states they have sent [place] several faxes and called but has not heard from [place]. She will be taking of some thing with her mother today ( father just passed away ) so she may not be able to answer but she would like a call back to let her know that this was taken care so she will be able to follow up with the pharmacy

## 2019-11-29 ENCOUNTER — Other Ambulatory Visit: Payer: Self-pay

## 2019-11-29 MED ORDER — GILENYA 0.5 MG PO CAPS: 0.5000 mg | ORAL_CAPSULE | Freq: Every day | ORAL | 3 refills | Status: DC

## 2019-11-29 NOTE — Telephone Encounter (Signed)
Patient states that she needs prior auth for the medication and we need to call  [employer] @ [phone]. She really needs this done today she has two pills left and they will still be able to over night the medication   Please call and let patient know the status

## 2019-11-29 NOTE — Telephone Encounter (Signed)
Called to let her know her prior authorization is complete and good for 1 year until 11/28/2020. Also the prescription was renewed and we will see her in a couple days at her appointment. She was very appreciative and expressed her gratitude calling us all awesome and always helpful.

## 2019-11-29 NOTE — Progress Notes (Signed)
Per CoverMyMeds must call FEP Clinical Call Center (spoke to [place]) for prior approval of Gilenya [phone] to get prior authorization. Her prior approval is current for 1 year (11/29/2019-11/28/2020). If it has already been sent to the pharmacy, the insurance company said it may take 1-1.5 days for the specialty pharmacy to be able to see the approval. [facility] phone # is [phone] Her insurance [id number] does not have authorization codes or reference numbers. Heather sent in the new prescription.

## 2019-11-30 ENCOUNTER — Ambulatory Visit: Payer: Federal, State, Local not specified - PPO | Admitting: Neurology

## 2019-11-30 NOTE — Progress Notes (Signed)
NEUROLOGY FOLLOW UP OFFICE NOTE  Crystal Simon [id number]  HISTORY OF PRESENT ILLNESS: Crystal Simon is a 54 year old right-handed Caucasian woman who follows up for multiple sclerosis.  UPDATE: Current disease modifying therapy: Crystal Simon Other medications:Gabapentin 600 mg at bedtime, Lexapro 10mg ; Crystal Simon; D3 5,000 IU daily  11/23/2019 LABS:  CBC with WBC 5.1, HGB 14.9, HCT 42.1, ALC 0.4; Hepatic Panel with t bili 1.4, ALP 110, AST 44, ALT 58; vitamin D 25-hydroxy 59.  Vision:No issues Motor:No issues Sensory:Chronic sensory deficits.. Tongue is numb, which sometimes causes slurred speech. She has tingling involving her face. Pain: muscle spasms and aching in the legs.  She sometimes notes an intermittent pain along the right side of her torso just under the breast and radiating to the back. It feels like a spasm and causes shortness of breath. Sometimes it radiates up the right side of her neck and into the jaw.  She has bilateral suboccipital pressure-like headaches that sometimes involves the right eye.  She has neck pain.  More recently, they have been occurring once a week.   Gait:Chronic disability. Uses a cane. Unchanged. Bowel/Bladder: Irritable bowel syndrome. Fatigue:Yes.  It greatly affects her quality of life.  It is hard to sometimes function.  Her insurance would not approve Crystal Simon.   Cognition:No issues Mood:No issues  She is interested in switching to Crystal Simon.  She likes the idea of taking an injection once a month instead of a daily pill.  HISTORY: Initial symptom in 2012 presented with 1 week episode of vertigo with feeling off-balance. This was followed by intermittent numbness and tingling of her hands and feet. In November 2014, she developed double vision associated with slurred speech, right sided numbness and difficulty swallowing (the right side of her mouth, tongue and throat felt numb). MRI of the brain with and without contrast from  07/12/13 was personally reviewed and showed hyperintense T2 lesions in the on left side of the midbrain. MRI of cervical spine from 08/02/13 was personally reviewed and was negative for demyelinating lesions. She underwent a lumbar puncture in January 2015. CSF cell count was 1, protein 33, glucose 56, 3 oligoclonal bands (not present in serum), negative myelin basic protein, IgG index 0.52. NMO antibody was negative.  Past disease modifying medication: Crystal Simon (effective but caused significant GERD).  At baseline, she had paresthesias of the right side of her face and throat. She also has paresthesias in the upper extremities and toes, worse on the right. Sometimes she needs to use a cane. Heat (such as during the summer months), stress and lack of sleep exacerbate chronic symptoms. She also has restless leg syndrome.  In January 2019, she had an elevatedt bili 1.3, AST of 52 and ALT of 69. It may have been due to gallstones rather than Crystal Simon, for which she underwent surgery. Over the next several months, her LFTs have been monitored and have steadily trended down.Overall, she continues to have mild transaminitis which may be secondary to Crystal Simon.  In February2019, she reported some headaches, a shooting pain radiating from the back of the head to the eye, usually right sided but also left sided as well. It lasts only a few seconds and occurs two to three times a week. She reported some neck pain. She also endorsed weakness and postural tremor in the hands. Due to these new symptoms, she underwent repeat MRI of brain and cervical spine with and without contrast on 11/11/17, which was personally reviewed. The brain showed stable and unchanged punctate  focus of abnormal contrast enhancement in the left paramedian midbrain which was seen back in August. It is not seen on other sequences. It was read as not characteristic for MS, possibly a capillary telangiectasia, chronic punctate  infarct.  Imaging: 07/12/2013 MRI BRAIN W WO:  hyperintense T2 lesions in the on left side of the midbrain.  08/02/2013 MRI CERVICAL SPINE W WO:  negative for demyelinating lesions 01/18/2014 MRI BRAIN W WO: reportedly normal with resolution of the previous midbrain lesion (report available but not images).  10/09/2015 MRI BRAIN W WO: reportedly again unremarkable and showed no significant white matter disease. Known 6 mm pineal cyst was noted.  04/14/2017 MRI BRAIN W WO:  punctate focus of enhancement in the location of previous left midbrain lesion, of uncertain etiology and significance. Pineal cyst measures 7 mm. 04/11/2019 MRI BRAIN W WO:  stable, again noted unchanged punctate focus of enhancement in the midbrain without associated T2 signal abnormality.  Incidental 8 mm pineal cyst unchanged.  Otherwise, brain unremarkable.  PAST MEDICAL HISTORY: Past Medical History:  Diagnosis Date  . Anxiety   . Arthritis   . Bulging discs 1998   cervical  . Complication of anesthesia   . Constipation   . Depression   . Dysrhythmia   . Floppy mitral valve    dx 30 yrs ago  . GERD (gastroesophageal reflux disease)   . [name] syndrome since late teens  . Headache(784.0)   . Heart palpitations   . Hemorrhoids   . Hypertension   . MS (multiple sclerosis) ([place])   . PONV (postoperative nausea and vomiting)    last couple surgeries, she has done ok    MEDICATIONS: Current Outpatient Medications on File Prior to Visit  Medication Sig Dispense Refill  . ALPRAZolam (XANAX) 0.5 MG tablet TAKE 1 TABLET BY MOUTH ONCE (1) DAILY AT BEDTIME AS NEEDED FOR ANXIETY 30 tablet 5  . Biotin 5000 MCG TABS Take 1 tablet by mouth daily.     . Cholecalciferol (VITAMIN D-3) 5000 units TABS Take 1 tablet by mouth daily. 7000 mg day    . Dexlansoprazole (DEXILANT PO) Take by mouth.    . diphenhydramine-acetaminophen (TYLENOL PM) 25-500 MG TABS tablet Take 1 tablet by mouth at bedtime as needed.    Marland Kitchen  escitalopram (LEXAPRO) 10 MG tablet TAKE 1 TABLET BY MOUTH ONCE (1) DAILY 30 tablet 5  . Fingolimod HCl (Crystal Simon) 0.5 MG CAPS Take 1 capsule (0.5 mg total) by mouth daily. 90 capsule 3  . gabapentin (NEURONTIN) 300 MG capsule Take 1 capsule in AM and 2 capsules in PM 90 capsule 5  . Crystal Simon 0.5 MG CAPS TAKE ONE CAPSULE (0.5 MG TOTAL) BY MOUTH DAILY 90 capsule 3  . hydrochlorothiazide (HYDRODIURIL) 25 MG tablet Take 25 mg by mouth daily.    Marland Kitchen linaCLOtide (LINZESS PO) Take by mouth.    . Melatonin 10 MG TABS Take 1 tablet by mouth daily.     . metoprolol succinate (TOPROL-XL) 25 MG 24 hr tablet Take 50 mg by mouth daily.    . modafinil (Crystal Simon) 100 MG tablet Take 1 tablet (100 mg total) by mouth daily. 30 tablet 5  . nebivolol (BYSTOLIC) 5 MG tablet Take 5 mg by mouth daily.    . Omega-3 Fatty Acids (FISH OIL) 1000 MG CAPS Take 1 capsule by mouth daily.     . valACYclovir (VALTREX) 500 MG tablet Take 500 mg by mouth daily as needed (cold sores).  No current facility-administered medications on file prior to visit.    ALLERGIES: Allergies  Allergen Reactions  . Morphine Nausea And Vomiting  . Oxycodone Other (See Comments) and Nausea And Vomiting  . Latex Rash  . Prednisone Other (See Comments)    Causes extreme hypotension    FAMILY HISTORY: Family History  Problem Relation Age of Onset  . Heart disease Father   . Colonic polyp Father   . Hypertension Mother   . Diabetes Mother   . Skin cancer Mother   . Alzheimer's disease Maternal Grandmother   . Diabetes Maternal Grandmother   . Hypertension Maternal Grandmother   . Colon cancer Neg Hx    SOCIAL HISTORY: Social History   Socioeconomic History  . Marital status: Married    Spouse name: Cletus Gash   . Number of children: 3  . Years of education: 4  . Highest education level: Not on file  Occupational History  . Occupation: housewife  Tobacco Use  . Smoking status: Current Some Day Smoker    Packs/day: 0.25     Types: Cigarettes    Last attempt to quit: 09/08/1994    Years since quitting: 25.2  . Smokeless tobacco: Never Used  . Tobacco comment: husband just recently dx with cancer  Substance and Sexual Activity  . Alcohol use: No  . Drug use: No  . Sexual activity: Yes    Comment: intercourse age 43, sexual partners less than 5  Other Topics Concern  . Not on file  Social History Narrative   ** Merged History Encounter **       ** Data from: 05/25/12 Enc Dept: [facility]       ** Data from: 07/20/13 Enc Dept: [facility]   Patient is married Cletus Gash).   Patient has 3 children      Patient has high school education.   Caffeine consumption two cups daily   Drinks a lot of dr peppers   Right handed   One story home       Social Determinants of Health   Financial Resource Strain:   . Difficulty of Paying Living Expenses:   Food Insecurity:   . Worried About Charity fundraiser in the Last Year:   . Arboriculturist in the Last Year:   Transportation Needs:   . Film/video editor (Medical):   Marland Kitchen Lack of Transportation (Non-Medical):   Physical Activity:   . Days of Exercise per Week:   . Minutes of Exercise per Session:   Stress:   . Feeling of Stress :   Social Connections:   . Frequency of Communication with Friends and Family:   . Frequency of Social Gatherings with Friends and Family:   . Attends Religious Services:   . Active Member of Clubs or Organizations:   . Attends Archivist Meetings:   Marland Kitchen Marital Status:   Intimate Partner Violence:   . Fear of Current or Ex-Partner:   . Emotionally Abused:   Marland Kitchen Physically Abused:   . Sexually Abused:     REVIEW OF SYSTEMS: Constitutional: No fevers, chills, or sweats, no generalized fatigue, change in appetite Eyes: No visual changes, double vision, eye pain Ear, nose and throat: No hearing loss, ear pain, nasal congestion, sore throat Cardiovascular: No chest pain, palpitations Respiratory:  No  shortness of breath at rest or with exertion, wheezes GastrointestinaI: No nausea, vomiting, diarrhea, abdominal pain, fecal incontinence Genitourinary:  No dysuria, urinary retention or frequency Musculoskeletal:  No neck pain, back pain Integumentary: No rash, pruritus, skin lesions Neurological: as above Psychiatric: No depression, insomnia, anxiety Endocrine: No palpitations, fatigue, diaphoresis, mood swings, change in appetite, change in weight, increased thirst Hematologic/Lymphatic:  No purpura, petechiae. Allergic/Immunologic: no itchy/runny eyes, nasal congestion, recent allergic reactions, rashes  PHYSICAL EXAM: Blood pressure (!) 170/104, pulse 67, resp. rate 18, height 5\' 9"  (1.753 m), weight 226 lb (102.5 kg), SpO2 99 %. General: No acute distress.  Patient appears well-groomed.   Head:  Normocephalic/atraumatic Eyes:  Fundi examined but not visualized Neck: supple, no paraspinal tenderness, full range of motion Heart:  Regular rate and rhythm Lungs:  Clear to auscultation bilaterally Back: No paraspinal tenderness Neurological Exam: alert and oriented to person, place, and time. Attention span and concentration intact, recent and remote memory intact, fund of knowledge intact.  Speech fluent and not dysarthric, language intact.  Altered right V2-V3 sensation.  Otherwise, CN II-XII intact. Bulk and tone normal, muscle strength 5/5 throughout.  Sensation to pinprick reduced in hands and right lower extremity.  Vibratory sensation reduced in left foot.  Deep tendon reflexes 2+ throughout, toes downgoing.  Finger to nose and heel to shin testing intact.  Ambulates with limp.  Romberg positive.  IMPRESSION: 1.  Multiple sclerosis 2.  Mild transaminitis, may be secondary to Crystal Simon.  Stable.  Continue to monitor.  PLAN: 1.  Plan is to switch from Crystal Simon to [place]:  -  Check labs:  Hepatitis B panel (HBsAg, HBcAb) and quantitative serum immunoglobulins  -  Check a new baseline  MRI of the brain with and without contrast  -  She is supposed to get her second Covid shot in 3 weeks.  Advised that she would not be able to start Kesimpta until 4 weeks after her second shot. 2.  Continue gabapentin, Crystal Simon, D3 5000 IU daily 3.  Follow up in 6 months.  Metta Clines, DO  CC: Reinaldo Meeker, MD

## 2019-12-01 ENCOUNTER — Ambulatory Visit: Payer: Federal, State, Local not specified - PPO | Admitting: Neurology

## 2019-12-01 ENCOUNTER — Other Ambulatory Visit: Payer: Self-pay

## 2019-12-01 ENCOUNTER — Encounter: Payer: Self-pay | Admitting: Neurology

## 2019-12-01 VITALS — BP 170/104 | HR 67 | Resp 18 | Ht 69.0 in | Wt 226.0 lb

## 2019-12-01 DIAGNOSIS — G35 Multiple sclerosis: Secondary | ICD-10-CM

## 2019-12-01 NOTE — Patient Instructions (Addendum)
Plan is to switch from [place] to [place] 1.  Check labs:   Hepatitis B panel (HBsAg, HBcAb); quantitative serum immunoglobulins. 2.  Check MRI of brain with and without contrast  You will not be able to start Kesimpta for 4 weeks after second Covid vaccine.  The initial dose will be one injection weekly for 3 weeks, then one injection every 4 weeks.  We have sent a referral to [facility] for your MRI and they will call you directly to schedule your appointment. They are located at [place]. If you need to contact them directly please call [phone].

## 2019-12-02 ENCOUNTER — Other Ambulatory Visit: Payer: Self-pay

## 2019-12-02 DIAGNOSIS — G35 Multiple sclerosis: Secondary | ICD-10-CM

## 2019-12-15 ENCOUNTER — Other Ambulatory Visit: Payer: Self-pay | Admitting: Legal Medicine

## 2019-12-15 DIAGNOSIS — Z1231 Encounter for screening mammogram for malignant neoplasm of breast: Secondary | ICD-10-CM

## 2019-12-31 ENCOUNTER — Ambulatory Visit
Admission: RE | Admit: 2019-12-31 | Discharge: 2019-12-31 | Disposition: A | Discharge status: Home / Self Care | Payer: Federal, State, Local not specified - PPO | Source: Ambulatory Visit | Attending: Neurology | Admitting: Neurology

## 2019-12-31 DIAGNOSIS — G35 Multiple sclerosis: Secondary | ICD-10-CM

## 2019-12-31 MED ORDER — GADOBENATE DIMEGLUMINE 529 MG/ML IV SOLN
20.0000 mL | Freq: Once | INTRAVENOUS | Status: AC | PRN
  Administered 2019-12-31: 20 mL via INTRAVENOUS

## 2020-01-04 ENCOUNTER — Other Ambulatory Visit: Payer: Federal, State, Local not specified - PPO

## 2020-01-04 ENCOUNTER — Other Ambulatory Visit: Payer: Self-pay

## 2020-01-05 LAB — IGG, IGA, IGM
IgG (Immunoglobin G), Serum: 780 mg/dL (ref 600–1640)
IgM, Serum: 130 mg/dL (ref 50–300)
Immunoglobulin A: 167 mg/dL (ref 47–310)

## 2020-01-05 LAB — HEPATITIS B SURFACE ANTIGEN: Hepatitis B Surface Ag: NONREACTIVE

## 2020-01-05 LAB — HEPATITIS B CORE ANTIBODY, IGM: Hep B C IgM: NONREACTIVE

## 2020-01-18 ENCOUNTER — Telehealth: Payer: Self-pay

## 2020-01-18 NOTE — Telephone Encounter (Signed)
Telephone call to pt: Pt wants to go ahead with Kesimpta.   Advised pt she has come and sign the form so we can fax over to the company as well as DR. Jaffe.

## 2020-01-20 IMAGING — MR MR CERVICAL SPINE WO/W CM
5 of 8 series · 26 of 48 positions shown · IV contrast (Multihance 20ml)
Comparison: Brain MRI 04/14/2017

Cervical spine MRI 08/02/2013

CLINICAL DATA: Multiple sclerosis follow-up

Creatinine was obtained on site at [HOSPITAL] at [HOSPITAL].
Results: Creatinine 1.0 mg/dL.
EXAM:
MRI HEAD WITHOUT AND WITH CONTRAST
MRI CERVICAL SPINE WITHOUT AND WITH CONTRAST
TECHNIQUE: Multiplanar, multiecho pulse sequences of the brain and surrounding
structures, and cervical spine, to include the craniocervical
junction and cervicothoracic junction, were obtained without and
with intravenous contrast.
CONTRAST:  20mL MULTIHANCE GADOBENATE DIMEGLUMINE 529 MG/ML IV SOLN

[Series 6: T1 · sagittal · 3.0mm · 0.66mm/px · 4 of 15 slices shown (1 of 3)]
[im 1/15]
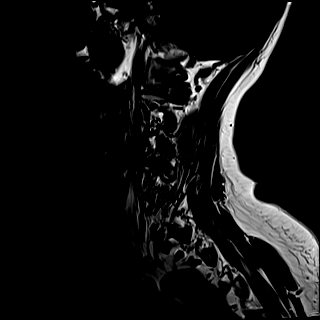
[im 5/15]
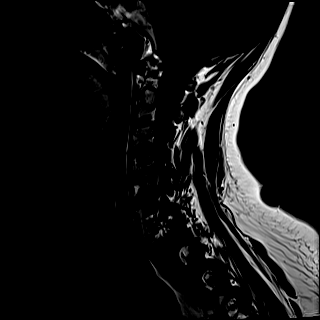
[im 10/15]
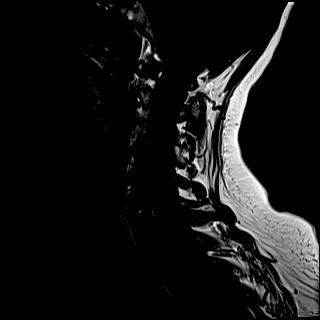
[im 15/15]
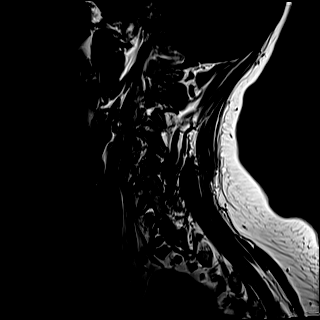

[Series 8: T2 · axial · 3.0mm · 0.50mm/px · z∈[-222,-116]mm · 8 of 34 slices shown (1 of 2)]
[im 1/34]
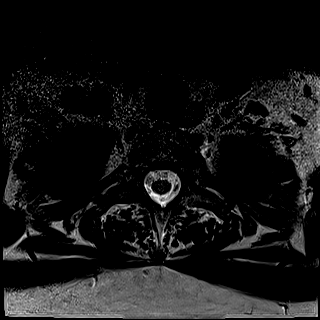
[im 5/34]
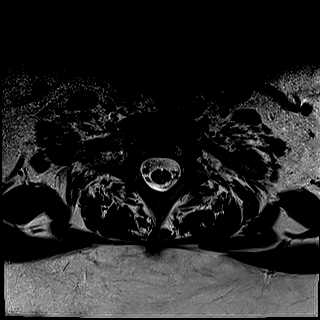
[im 10/34]
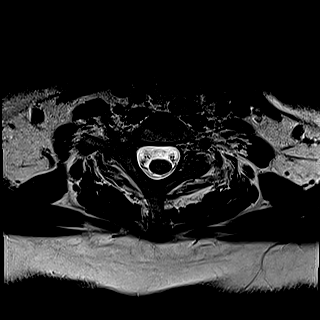
[im 15/34]
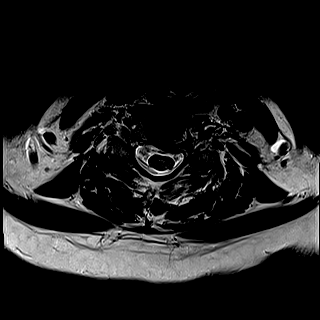
[im 19/34]
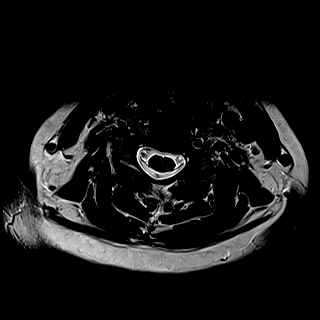
[im 24/34]
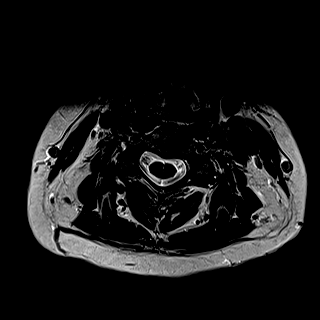
[im 29/34]
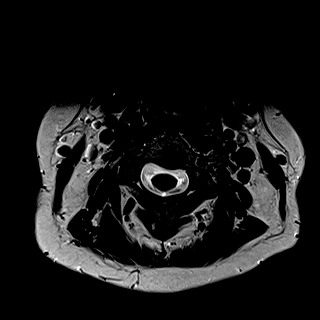
[im 34/34]
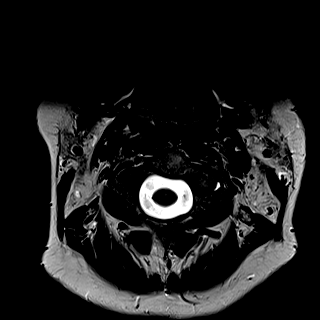

[Series 10: T1 · axial · non-contrast · 3.0mm · 0.31mm/px · z∈[-222,-116]mm · 8 of 34 slices shown (2 of 3)]
[im 1/34]
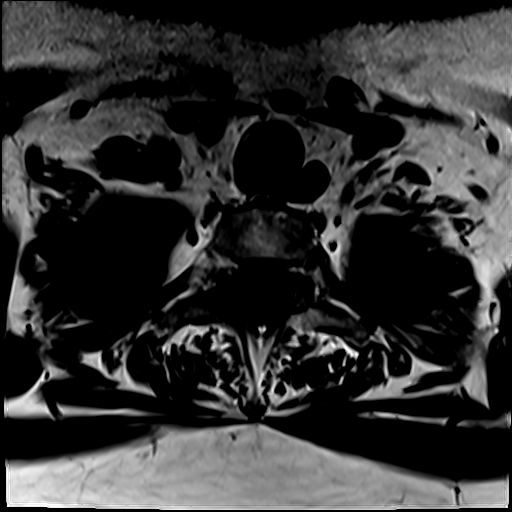
[im 5/34]
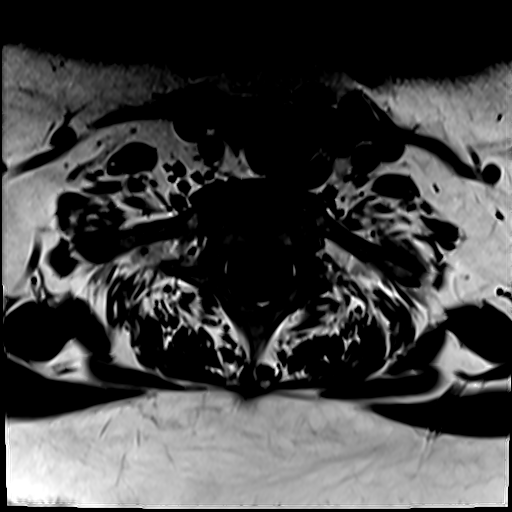
[im 10/34]
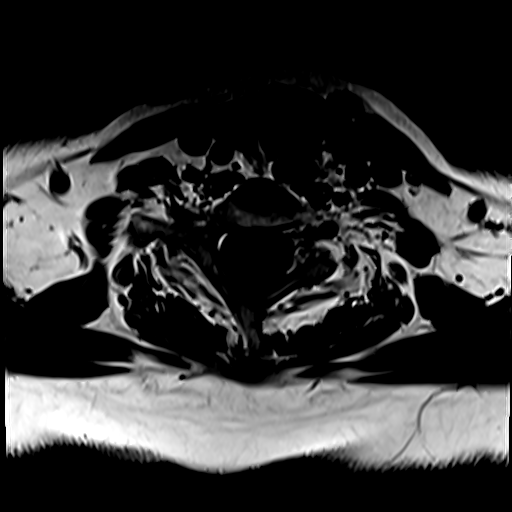
[im 15/34]
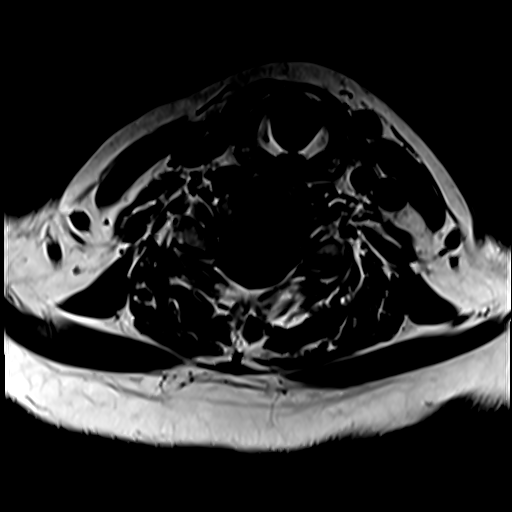
[im 19/34]
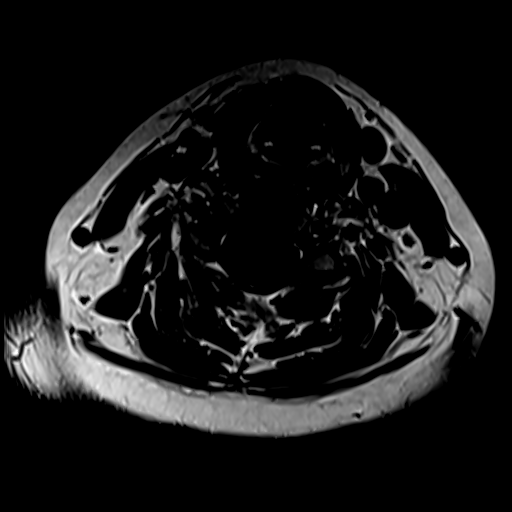
[im 24/34]
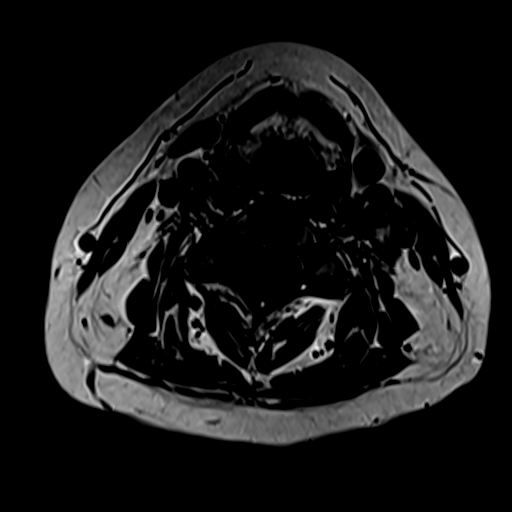
[im 29/34]
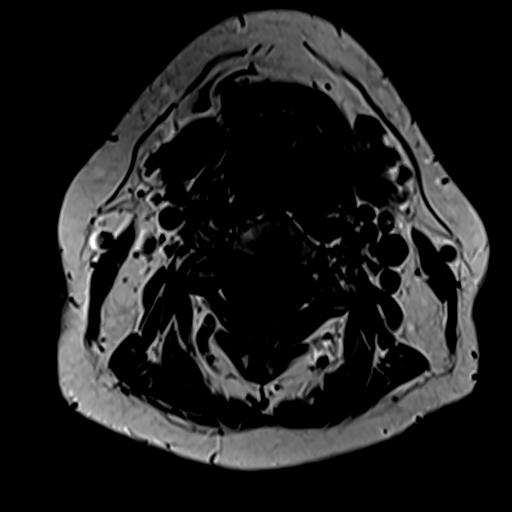
[im 34/34]
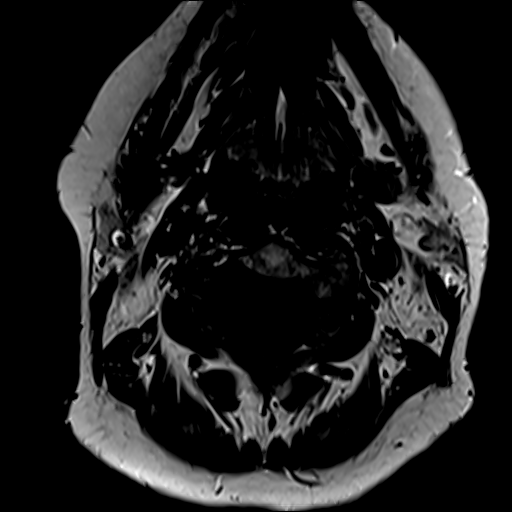

[Series 11: T2 · sagittal · 3.0mm · 0.55mm/px · 4 of 15 slices shown (2 of 2)]
[im 1/15]
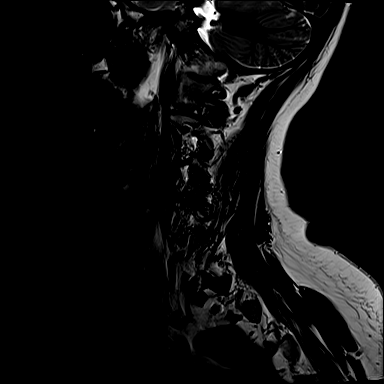
[im 5/15]
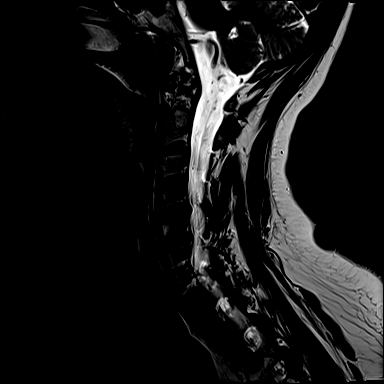
[im 10/15]
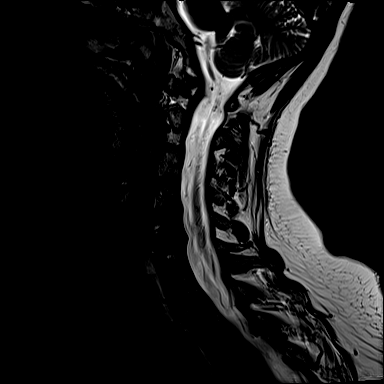
[im 15/15]
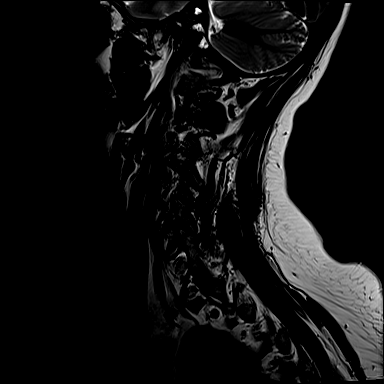

[Series 13: T1 · axial · 3.0mm · 0.31mm/px · z∈[-222,-209]mm · 2 of 34 slices shown (3 of 3)]
[im 1/34]
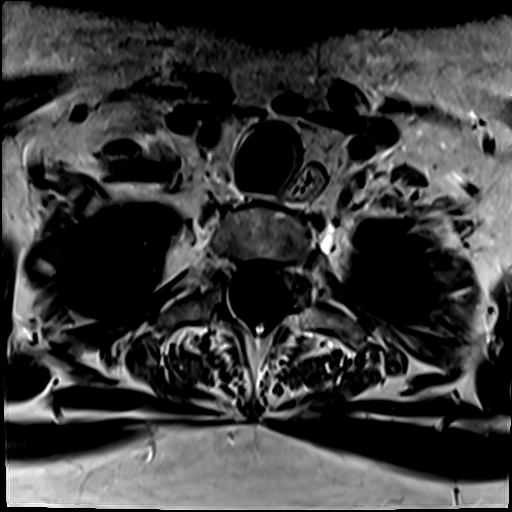
[im 5/34]
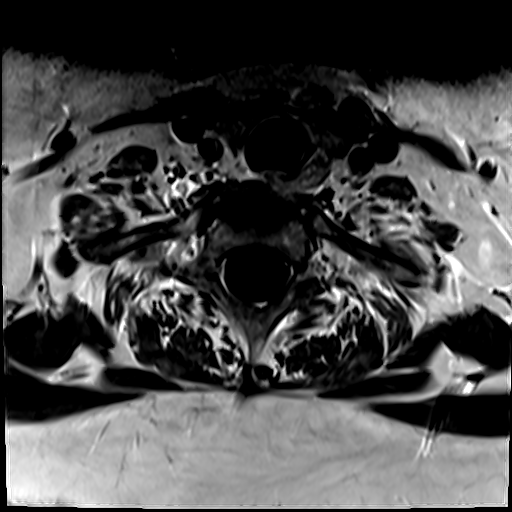

[26 of 48 positions shown; findings below may reference images not displayed]

FINDINGS: MRI HEAD FINDINGS

Brain: Unchanged appearance of punctate focus of contrast
enhancement within the left paramedian midbrain, without associated
signal abnormality on any of the other sequences. There is no other
abnormal contrast enhancement. There is no acute infarct or acute
hemorrhage. No mass effect, hydrocephalus, dural abnormality or
extra-axial collection. The brain parenchymal signal is normal for
the patient's age. No age-advanced or lobar predominant atrophy. No
chronic microhemorrhage or superficial siderosis.

Vascular: Major intracranial arterial and venous sinus flow voids
are preserved.

Skull and upper cervical spine: The visualized skull base,
calvarium, upper cervical spine and extracranial soft tissues are
normal.

Sinuses/Orbits: No fluid levels or advanced mucosal thickening. No
mastoid or middle ear effusion. Normal orbits.

MRI CERVICAL SPINE FINDINGS

Alignment: Normal.

Vertebrae: No focal marrow lesion. No compression fracture or
evidence of discitis osteomyelitis.

Cord: Normal caliber and signal.

Posterior Fossa, vertebral arteries, paraspinal tissues: Visualized
posterior fossa is normal. Vertebral artery flow voids are
preserved. Normal visualized paraspinal soft tissues.

Disc levels:

C1-C2: Normal.

C2-C3: Normal disc space and facet joints. No spinal canal stenosis.
No neuroforaminal stenosis.

C3-C4: Normal disc space and facet joints. No spinal canal stenosis.
No neuroforaminal stenosis.

C4-C5: Small left subarticular disc osteophyte complex with moderate
left foraminal narrowing. No spinal canal stenosis. No right
neuroforaminal stenosis.

C5-C6: Small left subarticular disc protrusion. No spinal canal
stenosis. Mild left neuroforaminal stenosis.

C6-C7: Normal disc space and facet joints. No spinal canal stenosis.
No neuroforaminal stenosis.

C7-T1: Normal disc space and facet joints. No spinal canal stenosis.
No neuroforaminal stenosis.

The T1-T2 level is visualized on sagittal images only. There is no
significant disc herniation, spinal canal stenosis or neuroforaminal
narrowing.
IMPRESSION: 1. Unchanged punctate focus of contrast enhancement in the left
paramedian midbrain without associated signal abnormality on any
other sequence. This remains unclear, but I suspect that this is an
old ischemic insult. The brainstem is prone to artifacts on
diffusion-weighted imaging and, in combination with the lesion's
small size and the subacute time frame of the initial scan, this
might account for the lack of visible diffusion restriction at that
time. Differential considerations of previous studies remain
possible.
2. No other abnormality within the brain or cervical spine. Unless
it has been proven by CSF analysis, it is unlikely this patient has
multiple sclerosis or another chronic demyelinating disease.
3. Midcervical degenerative disc disease, progressed from prior,
with moderate left C5 foraminal stenosis.

## 2020-01-20 IMAGING — MR MR HEAD WO/W CM
14 series · 48 of 48 positions shown · IV contrast (multihance)
Comparison: Brain MRI 04/14/2017

Cervical spine MRI 08/02/2013

CLINICAL DATA: Multiple sclerosis follow-up

Creatinine was obtained on site at [HOSPITAL] at [HOSPITAL].
Results: Creatinine 1.0 mg/dL.
EXAM:
MRI HEAD WITHOUT AND WITH CONTRAST
MRI CERVICAL SPINE WITHOUT AND WITH CONTRAST
TECHNIQUE: Multiplanar, multiecho pulse sequences of the brain and surrounding
structures, and cervical spine, to include the craniocervical
junction and cervicothoracic junction, were obtained without and
with intravenous contrast.
CONTRAST:  20mL MULTIHANCE GADOBENATE DIMEGLUMINE 529 MG/ML IV SOLN

[Series 5: T1 · sagittal · 4.0mm · 0.75mm/px · 1 of 31 slices shown (1 of 3)]
[im 1/31]
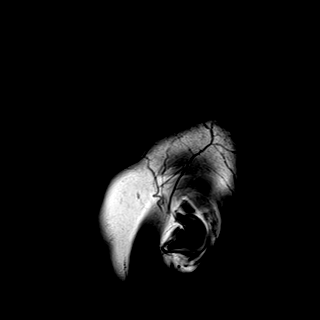

[Series 6: DWI · axial · 3.0mm · 1.44mm/px · z∈[-82,+67]mm · 4 of 92 slices shown (1 of 4)]
[im 1/92]
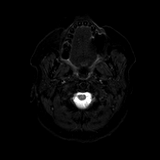
[im 31/92]
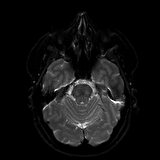
[im 61/92]
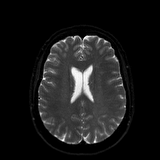
[im 92/92]
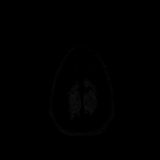

[Series 7: DWI · axial · 3.0mm · 1.44mm/px · z∈[-82,+67]mm · 3 of 45 slices shown (2 of 4)]
[im 1/45]
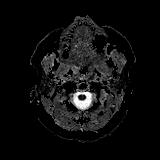
[im 23/45]
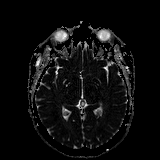
[im 45/45]
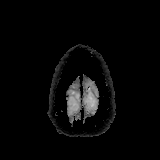

[Series 8: DWI · coronal · 5.0mm · 1.44mm/px · 4 of 62 slices shown (3 of 4)]
[im 1/62]
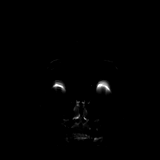
[im 21/62]
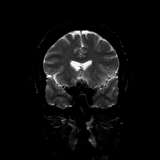
[im 41/62]
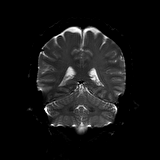
[im 62/62]
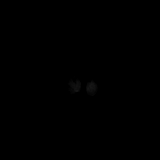

[Series 9: DWI · coronal · 5.0mm · 1.44mm/px · 2 of 31 slices shown (4 of 4)]
[im 1/31]
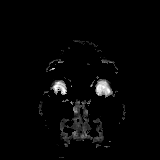
[im 31/31]
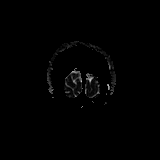

[Series 10: T2 · axial · 4.0mm · 0.36mm/px · z∈[-80,+66]mm · 2 of 29 slices shown]
[im 1/29]
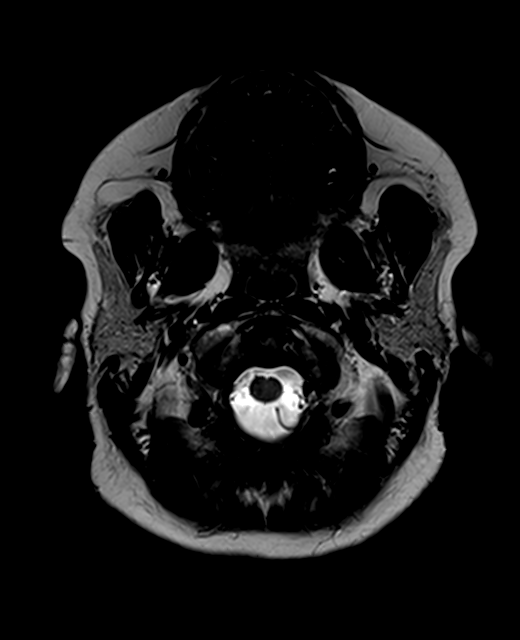
[im 29/29]
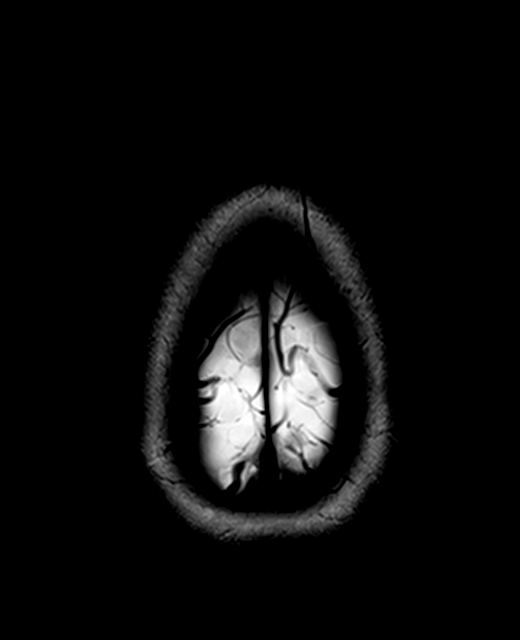

[Series 11: FLAIR · axial · 3.0mm · 0.72mm/px · z∈[-83,+67]mm · 2 of 26 slices shown (1 of 2)]
[im 1/26]
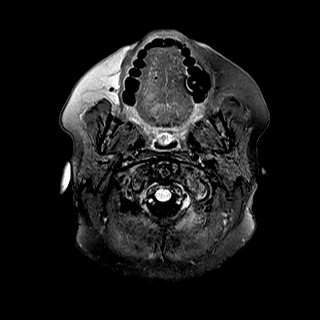
[im 26/26]
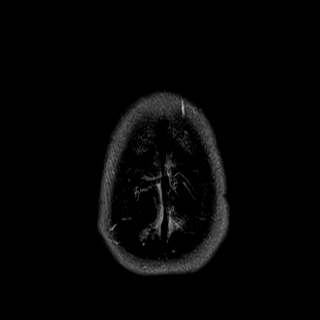

[Series 13: swi_images · axial · 1.5mm · 0.90mm/px · z∈[-78,+64]mm · 6 of 96 slices shown]
[im 1/96]
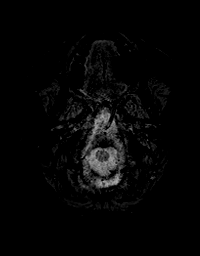
[im 20/96]
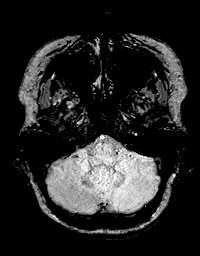
[im 39/96]
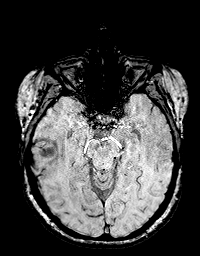
[im 58/96]
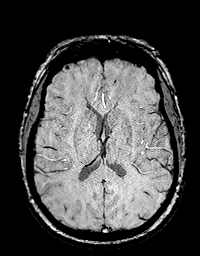
[im 77/96]
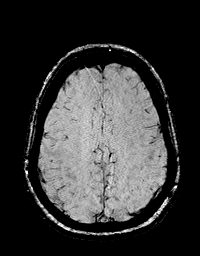
[im 96/96]
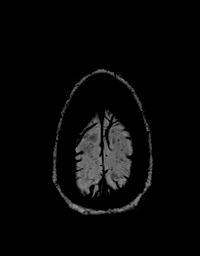

[Series 14: T1 · axial · 1.0mm · 0.90mm/px · z∈[-79,+64]mm · 8 of 144 slices shown (2 of 3)]
[im 1/144]
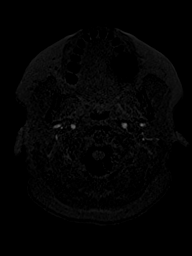
[im 21/144]
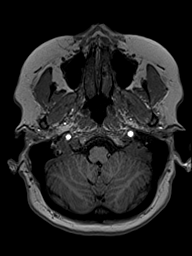
[im 41/144]
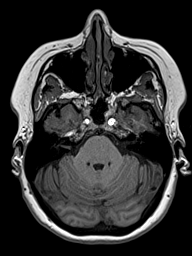
[im 62/144]
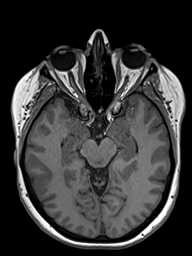
[im 82/144]
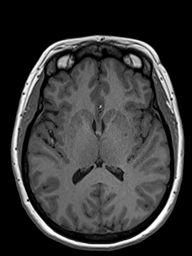
[im 103/144]
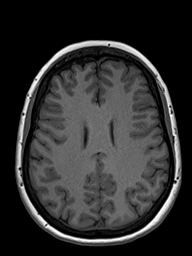
[im 123/144]
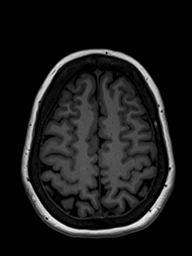
[im 144/144]
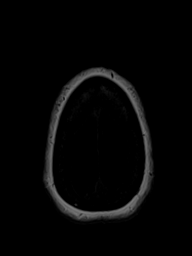

[Series 15: FLAIR · sagittal · 4.0mm · 0.72mm/px · 2 of 29 slices shown (2 of 2)]
[im 1/29]
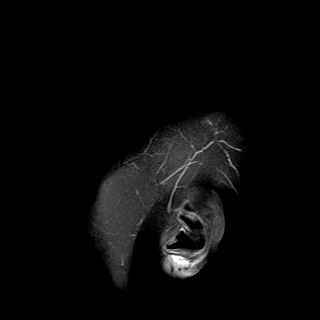
[im 29/29]
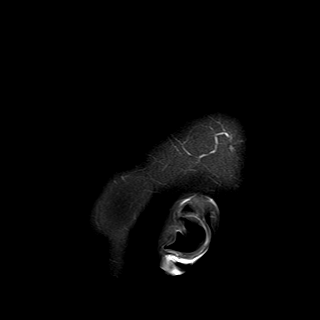

[Series 29: T2 post-contrast · coronal · 4.0mm · 0.36mm/px · 2 of 34 slices shown]
[im 1/34]
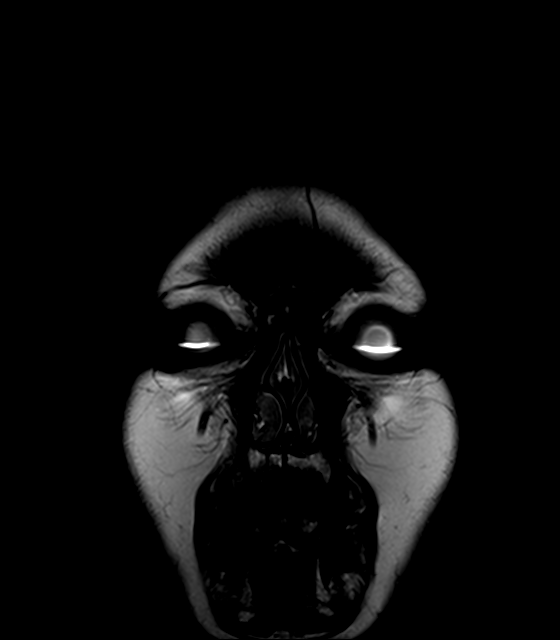
[im 34/34]
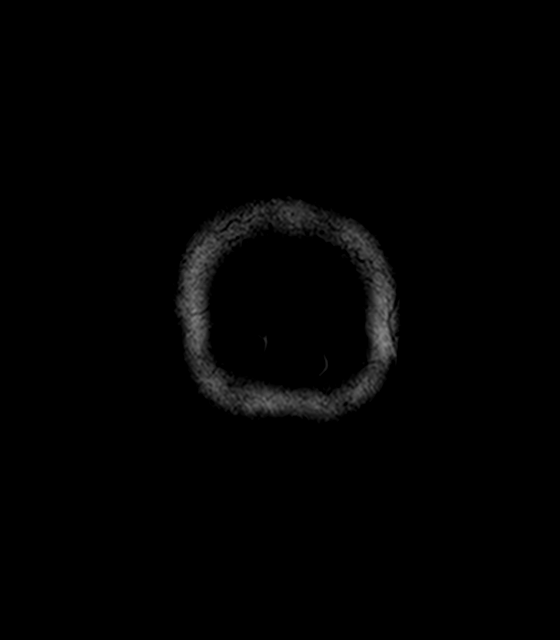

[Series 30: T1 · axial · 1.0mm · 0.90mm/px · z∈[-79,+64]mm · 8 of 144 slices shown (3 of 3)]
[im 1/144]
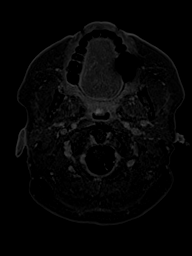
[im 21/144]
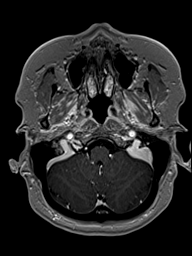
[im 41/144]
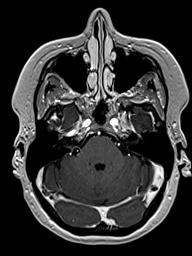
[im 62/144]
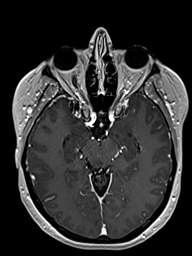
[im 82/144]
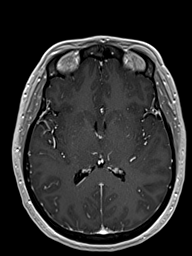
[im 103/144]
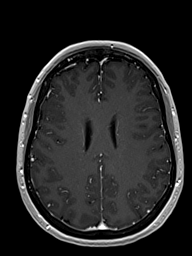
[im 123/144]
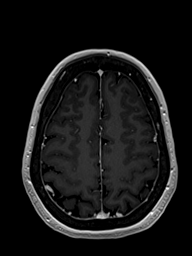
[im 144/144]
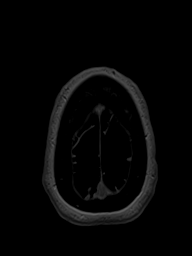

[Series 31: T1 post-contrast · coronal · 4.0mm · 0.72mm/px · 2 of 34 slices shown (1 of 2)]
[im 1/34]
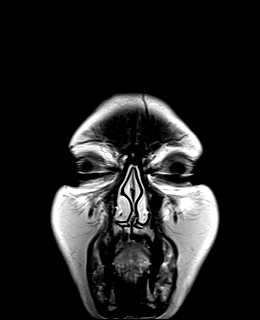
[im 34/34]
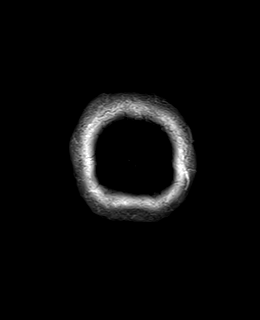

[Series 32: T1 post-contrast · sagittal · 4.0mm · 0.75mm/px · 2 of 31 slices shown (2 of 2)]
[im 1/31]
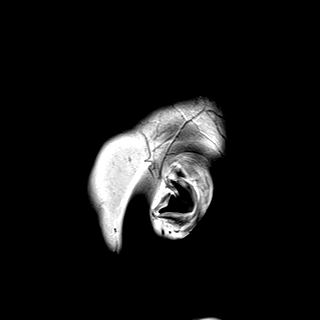
[im 31/31]
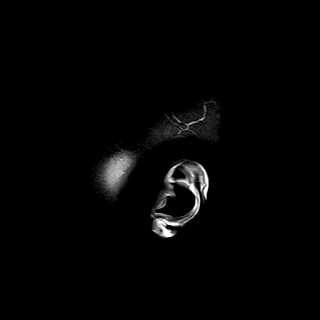

[48 of 48 positions shown; findings below may reference images not displayed]

FINDINGS: MRI HEAD FINDINGS

Brain: Unchanged appearance of punctate focus of contrast
enhancement within the left paramedian midbrain, without associated
signal abnormality on any of the other sequences. There is no other
abnormal contrast enhancement. There is no acute infarct or acute
hemorrhage. No mass effect, hydrocephalus, dural abnormality or
extra-axial collection. The brain parenchymal signal is normal for
the patient's age. No age-advanced or lobar predominant atrophy. No
chronic microhemorrhage or superficial siderosis.

Vascular: Major intracranial arterial and venous sinus flow voids
are preserved.

Skull and upper cervical spine: The visualized skull base,
calvarium, upper cervical spine and extracranial soft tissues are
normal.

Sinuses/Orbits: No fluid levels or advanced mucosal thickening. No
mastoid or middle ear effusion. Normal orbits.

MRI CERVICAL SPINE FINDINGS

Alignment: Normal.

Vertebrae: No focal marrow lesion. No compression fracture or
evidence of discitis osteomyelitis.

Cord: Normal caliber and signal.

Posterior Fossa, vertebral arteries, paraspinal tissues: Visualized
posterior fossa is normal. Vertebral artery flow voids are
preserved. Normal visualized paraspinal soft tissues.

Disc levels:

C1-C2: Normal.

C2-C3: Normal disc space and facet joints. No spinal canal stenosis.
No neuroforaminal stenosis.

C3-C4: Normal disc space and facet joints. No spinal canal stenosis.
No neuroforaminal stenosis.

C4-C5: Small left subarticular disc osteophyte complex with moderate
left foraminal narrowing. No spinal canal stenosis. No right
neuroforaminal stenosis.

C5-C6: Small left subarticular disc protrusion. No spinal canal
stenosis. Mild left neuroforaminal stenosis.

C6-C7: Normal disc space and facet joints. No spinal canal stenosis.
No neuroforaminal stenosis.

C7-T1: Normal disc space and facet joints. No spinal canal stenosis.
No neuroforaminal stenosis.

The T1-T2 level is visualized on sagittal images only. There is no
significant disc herniation, spinal canal stenosis or neuroforaminal
narrowing.
IMPRESSION: 1. Unchanged punctate focus of contrast enhancement in the left
paramedian midbrain without associated signal abnormality on any
other sequence. This remains unclear, but I suspect that this is an
old ischemic insult. The brainstem is prone to artifacts on
diffusion-weighted imaging and, in combination with the lesion's
small size and the subacute time frame of the initial scan, this
might account for the lack of visible diffusion restriction at that
time. Differential considerations of previous studies remain
possible.
2. No other abnormality within the brain or cervical spine. Unless
it has been proven by CSF analysis, it is unlikely this patient has
multiple sclerosis or another chronic demyelinating disease.
3. Midcervical degenerative disc disease, progressed from prior,
with moderate left C5 foraminal stenosis.

## 2020-02-01 ENCOUNTER — Other Ambulatory Visit: Payer: Self-pay

## 2020-02-01 ENCOUNTER — Ambulatory Visit
Admission: RE | Admit: 2020-02-01 | Discharge: 2020-02-01 | Disposition: A | Discharge status: Home / Self Care | Payer: Federal, State, Local not specified - PPO | Source: Ambulatory Visit | Attending: Legal Medicine | Admitting: Legal Medicine

## 2020-02-01 DIAGNOSIS — Z1231 Encounter for screening mammogram for malignant neoplasm of breast: Secondary | ICD-10-CM

## 2020-02-01 NOTE — Progress Notes (Signed)
Normal mammogram ?lp

## 2020-02-01 NOTE — Telephone Encounter (Signed)
2nd attempt to have pt come by the office to sign consent for Kesimpta

## 2020-02-01 NOTE — Telephone Encounter (Signed)
Patient came by the office and signed the forms with Sheena.

## 2020-02-07 NOTE — Progress Notes (Signed)
Velvet Wisler (Key: [id number]) Kesimpta 20MG /0.4ML auto-injectors Status: Question Request  Created: May 27th, 2021  Open  Archive

## 2020-02-08 NOTE — Progress Notes (Addendum)
Jaymes Nee (Key: [id number]) Kesimpta 20MG /0.4ML auto-injectors   Form Charity fundraiser PA Form (2017 NCPDP) Created 7 days ago Sent to Plan 2 days ago Plan Response 1 day ago Submit Clinical Questions 22 hours ago Determination Favorable 16 hours ago Message from [employer] Your PA request has been approved. Additional information will be provided in the approval communication. (Message 1120)  FAX sent to scan  Into her chart Shawnice Hauk (Key: [id number]) Kesimpta 20MG /0.4ML auto-injectors   FEP Clinical Call Center Fax Transmittal Phone: [phone] Fax: [phone] Date: 02/08/2020 To: Dr. Tomi Likens Fax #: [phone] From: [place] Re: [facility], April 16, 1966, Message: The Prior Authorization request has been approved for Kesimpta (ofatumumab). The authorization is valid from 01/09/2020 through 02/07/2022. A letter of explanation will also be mailed to the patient. This PA determination only applies to medications dispensed by a pharmacy and billed to the members pharmacy benefit. Now you can get responses to drug PAs immediately and securely online - without faxes, phone calls, or waiting. How? With electronic prior authorization (ePA)! For more information and to register, go to [email or web address]

## 2020-02-20 ENCOUNTER — Other Ambulatory Visit: Payer: Self-pay | Admitting: Legal Medicine

## 2020-02-22 ENCOUNTER — Encounter: Payer: Federal, State, Local not specified - PPO | Admitting: Obstetrics and Gynecology

## 2020-05-11 ENCOUNTER — Other Ambulatory Visit: Payer: Self-pay | Admitting: Neurology

## 2020-05-11 DIAGNOSIS — G35 Multiple sclerosis: Secondary | ICD-10-CM

## 2020-05-31 ENCOUNTER — Other Ambulatory Visit: Payer: Self-pay | Admitting: Legal Medicine

## 2020-06-06 ENCOUNTER — Other Ambulatory Visit: Payer: Self-pay | Admitting: Legal Medicine

## 2020-07-10 ENCOUNTER — Other Ambulatory Visit: Payer: Self-pay | Admitting: Neurology

## 2020-07-10 DIAGNOSIS — G35 Multiple sclerosis: Secondary | ICD-10-CM

## 2020-07-19 ENCOUNTER — Other Ambulatory Visit: Payer: Self-pay

## 2020-07-19 ENCOUNTER — Encounter: Payer: Self-pay | Admitting: Neurology

## 2020-07-19 ENCOUNTER — Other Ambulatory Visit (INDEPENDENT_AMBULATORY_CARE_PROVIDER_SITE_OTHER): Payer: Federal, State, Local not specified - PPO

## 2020-07-19 ENCOUNTER — Ambulatory Visit: Payer: Federal, State, Local not specified - PPO | Admitting: Neurology

## 2020-07-19 VITALS — BP 130/80 | HR 78 | Resp 20 | Ht 65.0 in | Wt 231.0 lb

## 2020-07-19 DIAGNOSIS — G35 Multiple sclerosis: Secondary | ICD-10-CM

## 2020-07-19 DIAGNOSIS — G44229 Chronic tension-type headache, not intractable: Secondary | ICD-10-CM | POA: Diagnosis not present

## 2020-07-19 LAB — COMPREHENSIVE METABOLIC PANEL
ALT: 19 U/L (ref 0–35)
AST: 16 U/L (ref 0–37)
Albumin: 4.3 g/dL (ref 3.5–5.2)
Alkaline Phosphatase: 101 U/L (ref 39–117)
BUN: 21 mg/dL (ref 6–23)
CO2: 30 mEq/L (ref 19–32)
Calcium: 9.5 mg/dL (ref 8.4–10.5)
Chloride: 105 mEq/L (ref 96–112)
Creatinine, Ser: 0.96 mg/dL (ref 0.40–1.20)
GFR: 66.45 mL/min (ref >60.00)
Glucose, Bld: 88 mg/dL (ref 70–99)
Potassium: 4.7 mEq/L (ref 3.5–5.1)
Sodium: 141 mEq/L (ref 135–145)
Total Bilirubin: 1.2 mg/dL (ref 0.2–1.2)
Total Protein: 7.1 g/dL (ref 6.0–8.3)

## 2020-07-19 LAB — VITAMIN D 25 HYDROXY (VIT D DEFICIENCY, FRACTURES): VITD: 60.34 ng/mL (ref 30.00–100.00)

## 2020-07-19 MED ORDER — NORTRIPTYLINE HCL 10 MG PO CAPS: 10.0000 mg | ORAL_CAPSULE | Freq: Every day | ORAL | 5 refills | Status: DC

## 2020-07-19 NOTE — Patient Instructions (Signed)
1.  Check CBC with diff, CMP, IgG/IgA/IgM, and vit D today and again in 6 months 2.  Check MRI of brain with and without contrast in 6 months 3.  Continue Kesimpta, D3 5,000 IU daily, gabapentin at bedtime, Provigil 4.  For headache, start nortriptyline 10mg  at bedtime.  Keep headache diary and contact me in 6 weeks with update. 5.  Limit use of pain relievers to no more than 2 days out of week to prevent risk of rebound or medication-overuse headache. 6.  Follow up in 6 months.

## 2020-07-19 NOTE — Progress Notes (Signed)
NEUROLOGY FOLLOW UP OFFICE NOTE  Crystal Simon [id number]   Subjective:  Crystal Simon is a 54 year old right-handed Caucasian woman who follows up for multiple sclerosis.  UPDATE: Current disease modifying therapy:  Kesimpta Other medications:Gabapentin 600 mg at bedtime,Lexapro 10mg ;Provigil 100mg ; D3 5,000 IU daily  In March, we decided to switch from [place] to [place].  01/01/2020 MRI BRAIN W WO:  Now visible within the cerebral hemispheric white matter are numerous small and punctate foci of T2 and FLAIR signal within the deep and subcortical white matter, approximately 8-10 on each side. The differential diagnosis is that of a manifestation of demyelinating disease versus interval expression of small-vessel disease. Majority of these were not present on the previous examinations. None show restricted diffusion or contrast enhancement.  Continued punctate focus of contrast enhancement in the left mid brain as seen previously, not felt to be clinically relevant. 11/23/2019 LABS:  CBC with WBC 5.1, HGB 14.9, HCT 42.1, ALC 0.4; hepatic panel with t bil 1.4, ALP 110, AST 44, ALT 58; Vit D 59 01/04/2020 LABS:  Hep B surface Ag and core Ab IgM non-reactive; quantitative serum immunoglobulins with IgA 167, IgG 780, IgM 130  Overall, she is feeling much better on Kesimpta.  Less episodes of "MS hug". Vision:No issues Motor:No issues Sensory:Chronic sensory deficits.. Tongue is numb, which sometimes causes slurred speech. She has tingling involving her face. Pain:muscle spasms and aching in the legs. Shesometimes notesan intermittent pain along the right side of her torso just under the breast and radiating to the back. It feels like a spasm and causes shortness of breath. Sometimes it radiates up the right side of her neck and into the jaw. Headaches:  Moderate.  Right-mid occipital pressure by end of day, daily.  Photophobia when severe.  No nausea or phonophobia.   They are severe 2 times a month.  Takes Advil 1 to 2 times a day. Gait:Chronic disability. Uses a cane. Unchanged. Bowel/Bladder: Irritable bowel syndrome. Fatigue:Yes. It greatly affects her quality of life. It is hard to sometimes function. Her insurance would not approve Provigil.  Cognition:No issues Mood:No issues     HISTORY: Initial symptom in 2012 presented with 1 week episode of vertigo with feeling off-balance. This was followed by intermittent numbness and tingling of her hands and feet. In November 2014, she developed double vision associated with slurred speech, right sided numbness and difficulty swallowing (the right side of her mouth, tongue and throat felt numb). MRI of the brain with and without contrast from 07/12/13 was personally reviewed and showed hyperintense T2 lesions in the on left side of the midbrain. MRI of cervical spine from 08/02/13 was personally reviewed and was negative for demyelinating lesions. She underwent a lumbar puncture in January 2015. CSF cell count was 1, protein 33, glucose 56, 3 oligoclonal bands (not present in serum), negative myelin basic protein, IgG index 0.52. NMO antibody was negative.  Past disease modifying medication: Tecfidera (effective but caused significant GERD), Gilenya  At baseline, she had paresthesias of the right side of her face and throat. She also has paresthesias in the upper extremities and toes, worse on the right. Sometimes she needs to use a cane. Heat (such as during the summer months), stress and lack of sleep exacerbate chronic symptoms. She also has restless leg syndrome.  In January 2019, she had an elevatedt bili 1.3, AST of 52 and ALT of 69. It may have been due to gallstones rather than Gilenya, for which she underwent surgery.  Over the next several months, her LFTs have been monitored and have steadily trended down.Overall, she continues to have mild transaminitis which may be secondary to  Gilenya.  In February2019, she reported some headaches, a shooting pain radiating from the back of the head to the eye, usually right sided but also left sided as well. It lasts only a few seconds and occurs two to three times a week. She reported some neck pain. She also endorsed weakness and postural tremor in the hands. Due to these new symptoms, she underwent repeat MRI of brain and cervical spine with and without contrast on 11/11/17, which was personally reviewed. The brain showed stable and unchanged punctate focus of abnormal contrast enhancement in the left paramedian midbrain which was seen back in August. It is not seen on other sequences. 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  Imaging: 07/12/2013 MRI BRAIN W WO:  hyperintense T2 lesions in the on left side of the midbrain.  08/02/2013 MRI CERVICAL SPINE W WO:  negative for demyelinating lesions 01/18/2014 MRI BRAIN W WO: reportedly normal with resolution of the previous midbrain lesion (report available but not images).  10/09/2015 MRI BRAIN W WO: reportedly again unremarkable and showed no significant white matter disease. Known 6 mm pineal cyst was noted.  04/14/2017 MRI BRAIN W WO:  punctate focus of enhancement in the location of previous left midbrain lesion, of uncertain etiology and significance. Pineal cyst measures 7 mm. 04/11/2019 MRI BRAIN W WO:  stable, again noted unchanged punctate focus of enhancement in the midbrain without associated T2 signal abnormality. Incidental 8 mm pineal cyst unchanged. Otherwise, brain unremarkable.  PAST MEDICAL HISTORY: Past Medical History:  Diagnosis Date  . Anxiety   . Arthritis   . Bulging discs 1998   cervical  . Complication of anesthesia   . Constipation   . Depression   . Dysrhythmia   . Floppy mitral valve    dx 30 yrs ago  . GERD (gastroesophageal reflux disease)   . [name] syndrome since late  teens  . Headache(784.0)   . Heart palpitations   . Hemorrhoids   . Hypertension   . MS (multiple sclerosis) ([place])   . PONV (postoperative nausea and vomiting)    last couple surgeries, she has done ok    MEDICATIONS: Current Outpatient Medications on File Prior to Visit  Medication Sig Dispense Refill  . modafinil (PROVIGIL) 100 MG tablet TAKE 1 TABLET BY MOUTH ONCE (1) DAILY 30 tablet 0  . ALPRAZolam (XANAX) 0.5 MG tablet TAKE 1 TABLET BY MOUTH ONCE (1) DAILY AT BEDTIME AS NEEDED FOR ANXIETY 30 tablet 3  . Biotin 5000 MCG TABS Take 1 tablet by mouth daily.     . Cholecalciferol (VITAMIN D-3) 5000 units TABS Take 1 tablet by mouth daily. 7000 mg day    . Dexlansoprazole (DEXILANT PO) Take by mouth.    . diphenhydramine-acetaminophen (TYLENOL PM) 25-500 MG TABS tablet Take 1 tablet by mouth at bedtime as needed.    Marland Kitchen escitalopram (LEXAPRO) 10 MG tablet TAKE 1 TABLET BY MOUTH ONCE (1) DAILY 30 tablet 5  . escitalopram (LEXAPRO) 20 MG tablet TAKE 1 TABLET BY MOUTH ONCE (1) DAILY 90 tablet 2  . Fingolimod HCl (GILENYA) 0.5 MG CAPS Take 1 capsule (0.5 mg total) by mouth daily. 90 capsule 3  . gabapentin (NEURONTIN) 300 MG capsule TAKE 1 CAPSULE BY MOUTH IN THE MORNING AND 2 CAPSULES IN THE EVENING  DAILY 90 capsule 5  . GILENYA 0.5 MG CAPS TAKE ONE CAPSULE (0.5 MG TOTAL) BY MOUTH DAILY 90 capsule 3  . hydrochlorothiazide (HYDRODIURIL) 25 MG tablet Take 25 mg by mouth daily.    Marland Kitchen linaCLOtide (LINZESS PO) Take by mouth.    . Melatonin 10 MG TABS Take 1 tablet by mouth daily.     . metoprolol succinate (TOPROL-XL) 25 MG 24 hr tablet TAKE 1 TABLET BY MOUTH TWICE (2) DAILY 60 tablet 3  . nebivolol (BYSTOLIC) 5 MG tablet Take 5 mg by mouth daily.    . Omega-3 Fatty Acids (FISH OIL) 1000 MG CAPS Take 1 capsule by mouth daily.     . valACYclovir (VALTREX) 500 MG tablet TAKE 1 TABLET BY MOUTH TWICE (2) DAILY FOR FEVER BLISTERS FOR 5 DAYS 30 tablet 3   No current facility-administered medications on  file prior to visit.    ALLERGIES: Allergies  Allergen Reactions  . Morphine Nausea And Vomiting  . Oxycodone Other (See Comments) and Nausea And Vomiting  . Latex Rash  . Prednisone Other (See Comments)    Causes extreme hypotension    FAMILY HISTORY: Family History  Problem Relation Age of Onset  . Heart disease Father   . Colonic polyp Father   . Hypertension Mother   . Diabetes Mother   . Skin cancer Mother   . Alzheimer's disease Maternal Grandmother   . Diabetes Maternal Grandmother   . Hypertension Maternal Grandmother   . Colon cancer Neg Hx     SOCIAL HISTORY: Social History   Socioeconomic History  . Marital status: Married    Spouse name: Cletus Gash   . Number of children: 3  . Years of education: 83  . Highest education level: Not on file  Occupational History  . Occupation: housewife  Tobacco Use  . Smoking status: Current Some Day Smoker    Packs/day: 0.25    Types: Cigarettes    Last attempt to quit: 09/08/1994    Years since quitting: 25.8  . Smokeless tobacco: Never Used  . Tobacco comment: husband just recently dx with cancer  Vaping Use  . Vaping Use: Never used  Substance and Sexual Activity  . Alcohol use: No  . Drug use: No  . Sexual activity: Yes    Comment: intercourse age 17, sexual partners less than 5  Other Topics Concern  . Not on file  Social History Narrative   ** Merged History Encounter **       ** Data from: 05/25/12 Enc Dept: [facility]       ** Data from: 07/20/13 Enc Dept: [facility]   Patient is married Cletus Gash).   Patient has 3 children      Patient has high school education.   Caffeine consumption two cups daily   Drinks a lot of dr peppers   Right handed   One story home       Social Determinants of Health   Financial Resource Strain:   . Difficulty of Paying Living Expenses: Not on file  Food Insecurity:   . Worried About Charity fundraiser in the Last Year: Not on file  . Ran Out of Food in  the Last Year: Not on file  Transportation Needs:   . Lack of Transportation (Medical): Not on file  . Lack of Transportation (Non-Medical): Not on file  Physical Activity:   . Days of Exercise per Week: Not on file  . Minutes of Exercise per Session: Not on  file  Stress:   . Feeling of Stress : Not on file  Social Connections:   . Frequency of Communication with Friends and Family: Not on file  . Frequency of Social Gatherings with Friends and Family: Not on file  . Attends Religious Services: Not on file  . Active Member of Clubs or Organizations: Not on file  . Attends Archivist Meetings: Not on file  . Marital Status: Not on file  Intimate Partner Violence:   . Fear of Current or Ex-Partner: Not on file  . Emotionally Abused: Not on file  . Physically Abused: Not on file  . Sexually Abused: Not on file     Objective:  Blood pressure 130/80, pulse 78, resp. rate 20, height 5\' 5"  (1.651 m), weight 231 lb (104.8 kg), SpO2 98 %. General: No acute distress.  Patient appears well-groomed.   Head:  Normocephalic/atraumatic Eyes:  Fundi examined but not visualized Neck: supple, no paraspinal tenderness, full range of motion Heart:  Regular rate and rhythm Lungs:  Clear to auscultation bilaterally Back: No paraspinal tenderness Neurological Exam: alert and oriented to person, place, and time. Attention span and concentration intact, recent and remote memory intact, fund of knowledge intact.  Speech fluent and not dysarthric, language intact.  Altered right V2-V3 sensation.  Otherwise, CN II-XII intact. Bulk and tone normal, muscle strength 5/5 throughout.  Sensation to pinprick reduced in hands and right lower extremity.  Vibratory sensation reduced in left foot.  Deep tendon reflexes 2+ throughout, toes downgoing.  Finger to nose and heel to shin testing intact.  Ambulates with limp.  Romberg positive.   Assessment/Plan:   1.  Multiple sclerosis 2.  Chronic tension-type  headache  1.  Check CBC with diff, CMP, quantitative immunoglobulin panel, and vit D today and again in 6 months 2.  Check MRI of brain with and without contrast in 6 months 3.  Continue Kesimpta, D3 5,000 IU daily, gabapentin at bedtime, Provigil 4.  For headache, start nortriptyline 10mg  at bedtime.  Keep headache diary and contact me in 6 weeks with update. 5.  Limit use of pain relievers to no more than 2 days out of week to prevent risk of rebound or medication-overuse headache. 6.  Follow up in 6 months.  Metta Clines, DO  CC: Reinaldo Meeker, MD

## 2020-07-20 ENCOUNTER — Other Ambulatory Visit: Payer: Self-pay

## 2020-07-20 DIAGNOSIS — G35 Multiple sclerosis: Secondary | ICD-10-CM

## 2020-07-20 LAB — IGG, IGA, IGM
IgG (Immunoglobin G), Serum: 934 mg/dL (ref 600–1640)
IgM, Serum: 112 mg/dL (ref 50–300)
Immunoglobulin A: 184 mg/dL (ref 47–310)

## 2020-07-20 NOTE — Progress Notes (Signed)
CBC with diff ordered

## 2020-08-23 ENCOUNTER — Encounter: Payer: Self-pay | Admitting: Legal Medicine

## 2020-08-23 ENCOUNTER — Telehealth (INDEPENDENT_AMBULATORY_CARE_PROVIDER_SITE_OTHER): Payer: Federal, State, Local not specified - PPO | Admitting: Legal Medicine

## 2020-08-23 VITALS — BP 142/100 | HR 76 | Temp 100.2°F | Ht 69.0 in | Wt 232.0 lb

## 2020-08-23 DIAGNOSIS — J019 Acute sinusitis, unspecified: Secondary | ICD-10-CM | POA: Diagnosis not present

## 2020-08-23 MED ORDER — AMOXICILLIN 875 MG PO TABS: 875.0000 mg | ORAL_TABLET | Freq: Two times a day (BID) | ORAL | 0 refills | Status: DC

## 2020-08-23 MED ORDER — PREDNISONE 10 MG (21) PO TBPK: ORAL_TABLET | ORAL | 0 refills | Status: DC

## 2020-08-23 NOTE — Progress Notes (Signed)
Virtual Visit via Telephone Note   This visit type was conducted due to national recommendations for restrictions regarding the COVID-19 Pandemic (e.g. social distancing) in an effort to limit this patient's exposure and mitigate transmission in our community.  Due to her co-morbid illnesses, this patient is at least at moderate risk for complications without adequate follow up.  This format is felt to be most appropriate for this patient at this time.  The patient did not have access to video technology/had technical difficulties with video requiring transitioning to audio format only (telephone).  All issues noted in this document were discussed and addressed.  No physical exam could be performed with this format.  Patient verbally consented to a telehealth visit.   Date:  08/23/2020   ID:  [name], DOB [birth year], MRN [id number]  Patient Location: Home Provider Location: Office/Clinic  PCP:  Lillard Anes, MD   Evaluation Performed:  New Patient Evaluation  Chief Complaint:  Cough , sinus pressure  History of Present Illness:    Sun Kihn is a 54 y.o. female with Cough , sinus pressure., col chills myalgias, no covid exposue.  Negative covid test at home.  The patient does not have symptoms concerning for COVID-19 infection (fever, chills, cough, or new shortness of breath).    Past Medical History:  Diagnosis Date  . Anxiety   . Arthritis   . Bulging discs 1998   cervical  . Complication of anesthesia   . Constipation   . Depression   . Dysrhythmia   . Floppy mitral valve    dx 30 yrs ago  . GERD (gastroesophageal reflux disease)   . [name] syndrome since late teens  . Headache(784.0)   . Heart palpitations   . Hemorrhoids   . Hypertension   . MS (multiple sclerosis) ([place])   . PONV (postoperative nausea and vomiting)    last couple surgeries, she has done ok    Past Surgical History:  Procedure Laterality Date  . ABDOMINAL HYSTERECTOMY    .  CHOLECYSTECTOMY N/A 09/30/2017   Procedure: LAPAROSCOPIC CHOLECYSTECTOMY WITH INTRAOPERATIVE CHOLANGIOGRAM;  Surgeon: Erroll Luna, MD;  Location: [facility];  Service: General;  Laterality: N/A;  . DILATION AND CURETTAGE OF UTERUS    . ENDOVENOUS ABLATION SAPHENOUS VEIN W/ LASER  summer 2011   right and left greater saphenous veins (, Onset)    . HEMORRHOID BANDING  12/2017  . SHOULDER SURGERY     ligament reattachment  . TUBAL LIGATION    . VAGINAL HYSTERECTOMY    . VEIN LIGATION AND STRIPPING  05/07/2012   Procedure: VEIN LIGATION AND STRIPPING;  Surgeon: Rosetta Posner, MD;  Location: [facility] OR;  Service: Vascular;  Laterality: Left;  WITH STAB PHELBECTOMY    Family History  Problem Relation Age of Onset  . Heart disease Father   . Colonic polyp Father   . Hypertension Mother   . Diabetes Mother   . Skin cancer Mother   . Alzheimer's disease Maternal Grandmother   . Diabetes Maternal Grandmother   . Hypertension Maternal Grandmother   . Colon cancer Neg Hx     Social History   Socioeconomic History  . Marital status: Married    Spouse name: Cletus Gash   . Number of children: 3  . Years of education: 5  . Highest education level: Not on file  Occupational History  . Occupation: housewife  Tobacco Use  . Smoking status: Current Some Day Smoker    Packs/day: 0.25  Types: Cigarettes    Last attempt to quit: 09/08/1994    Years since quitting: 25.9  . Smokeless tobacco: Never Used  . Tobacco comment: husband just recently dx with cancer  Vaping Use  . Vaping Use: Never used  Substance and Sexual Activity  . Alcohol use: No  . Drug use: No  . Sexual activity: Yes    Comment: intercourse age 40, sexual partners less than 5  Other Topics Concern  . Not on file  Social History Narrative   ** Merged History Encounter **       ** Data from: 05/25/12 Enc Dept: [facility]       ** Data from: 07/20/13 Enc Dept: [facility]   Patient is married Cletus Gash).   Patient  has 3 children      Patient has high school education.   Caffeine consumption two cups daily   Drinks a lot of dr peppers   Right handed   One story home       Social Determinants of Health   Financial Resource Strain: Not on file  Food Insecurity: Not on file  Transportation Needs: Not on file  Physical Activity: Not on file  Stress: Not on file  Social Connections: Not on file  Intimate Partner Violence: Not on file    Outpatient Medications Prior to Visit  Medication Sig Dispense Refill  . ALPRAZolam (XANAX) 0.5 MG tablet TAKE 1 TABLET BY MOUTH ONCE (1) DAILY AT BEDTIME AS NEEDED FOR ANXIETY 30 tablet 3  . Biotin 5000 MCG TABS Take 1 tablet by mouth daily.     . Cholecalciferol (VITAMIN D-3) 5000 units TABS Take 1 tablet by mouth daily. 7000 mg day    . Dexlansoprazole (DEXILANT PO) Take by mouth.    . diphenhydramine-acetaminophen (TYLENOL PM) 25-500 MG TABS tablet Take 1 tablet by mouth at bedtime as needed.    Marland Kitchen escitalopram (LEXAPRO) 10 MG tablet TAKE 1 TABLET BY MOUTH ONCE (1) DAILY 30 tablet 5  . escitalopram (LEXAPRO) 20 MG tablet TAKE 1 TABLET BY MOUTH ONCE (1) DAILY 90 tablet 2  . gabapentin (NEURONTIN) 300 MG capsule TAKE 1 CAPSULE BY MOUTH IN THE MORNING AND 2 CAPSULES IN THE EVENING DAILY 90 capsule 5  . GILENYA 0.5 MG CAPS TAKE ONE CAPSULE (0.5 MG TOTAL) BY MOUTH DAILY 90 capsule 3  . hydrochlorothiazide (HYDRODIURIL) 25 MG tablet Take 25 mg by mouth daily.    Marland Kitchen linaCLOtide (LINZESS PO) Take by mouth.    . Magnesium 250 MG TABS Take by mouth.    . Melatonin 10 MG TABS Take 1 tablet by mouth daily.     . metoprolol succinate (TOPROL-XL) 25 MG 24 hr tablet TAKE 1 TABLET BY MOUTH TWICE (2) DAILY 60 tablet 3  . modafinil (PROVIGIL) 100 MG tablet TAKE 1 TABLET BY MOUTH ONCE (1) DAILY 30 tablet 0  . nebivolol (BYSTOLIC) 5 MG tablet Take 5 mg by mouth daily.    . nortriptyline (PAMELOR) 10 MG capsule Take 1 capsule (10 mg total) by mouth at bedtime. 30 capsule 5  .  Ofatumumab (KESIMPTA) 20 MG/0.4ML SOAJ Inject into the skin.    . Omega-3 Fatty Acids (FISH OIL) 1000 MG CAPS Take 1 capsule by mouth daily.     . valACYclovir (VALTREX) 500 MG tablet TAKE 1 TABLET BY MOUTH TWICE (2) DAILY FOR FEVER BLISTERS FOR 5 DAYS 30 tablet 3  . Fingolimod HCl (GILENYA) 0.5 MG CAPS Take 1 capsule (0.5 mg total) by mouth  daily. (Patient not taking: Reported on 07/19/2020) 90 capsule 3   No facility-administered medications prior to visit.    Allergies:   Morphine, Oxycodone, Latex, and Prednisone   Social History   Tobacco Use  . Smoking status: Current Some Day Smoker    Packs/day: 0.25    Types: Cigarettes    Last attempt to quit: 09/08/1994    Years since quitting: 25.9  . Smokeless tobacco: Never Used  . Tobacco comment: husband just recently dx with cancer  Vaping Use  . Vaping Use: Never used  Substance Use Topics  . Alcohol use: No  . Drug use: No     Review of Systems  Constitutional: Positive for chills and fever. Negative for malaise/fatigue.  HENT: Positive for congestion and sinus pain.   Eyes: Negative for redness.  Respiratory: Positive for cough. Negative for sputum production and shortness of breath.   Cardiovascular: Negative for chest pain and palpitations.  Gastrointestinal: Negative for abdominal pain and heartburn.  Genitourinary: Negative for dysuria and urgency.  Musculoskeletal: Positive for myalgias.  Neurological: Negative for headaches.  Psychiatric/Behavioral: Negative.      Labs/Other Tests and Data Reviewed:    Recent Labs: 11/23/2019: Hemoglobin 14.9 07/19/2020: ALT 19; BUN 21; Creatinine, Ser 0.96; Potassium 4.7; Sodium 141   Recent Lipid Panel Lab Results  Component Value Date/Time   CHOL 202 (H) 12/06/2015 10:37 AM   TRIG 90 12/06/2015 10:37 AM   HDL 54 12/06/2015 10:37 AM   CHOLHDL 3.7 12/06/2015 10:37 AM   LDLCALC 130 (H) 12/06/2015 10:37 AM    Wt Readings from Last 3 Encounters:  08/23/20 232 lb (105.2 kg)   07/19/20 231 lb (104.8 kg)  12/01/19 226 lb (102.5 kg)     Objective:    Vital Signs:  BP (!) 142/100   Pulse 76   Temp 100.2 F (37.9 C)   Ht 5\' 9"  (1.753 m)   Wt 232 lb (105.2 kg)   BMI 34.26 kg/m    Physical Exam na  ASSESSMENT & PLAN:   Diagnoses and all orders for this visit: Acute non-recurrent sinusitis, unspecified location -     amoxicillin (AMOXIL) 875 MG tablet; Take 1 tablet (875 mg total) by mouth 2 (two) times daily. -     predniSONE (STERAPRED UNI-PAK 21 TAB) 10 MG (21) TBPK tablet; Take 6ills first day , then 5 pills day 2 and then cut down one pill day until gone Patient will  Be treated for acute sinusitis and to use OTC medicines for congestion       COVID-19 Education: The signs and symptoms of COVID-19 were discussed with the patient and how to seek care for testing (follow up with PCP or arrange E-visit). The importance of social distancing was discussed today.   I spent 20 minutes dedicated to the care of this patient on the date of this encounter to include face-to-face time with the patient, as well as: we discussed her ms and her husbands exposure to TB.  Follow Up:  In Person prn  Signed,  Reinaldo Meeker, MD  08/23/2020 2:38 PM    [place]

## 2020-10-01 ENCOUNTER — Other Ambulatory Visit: Payer: Self-pay | Admitting: Legal Medicine

## 2020-11-08 ENCOUNTER — Telehealth: Payer: Self-pay | Admitting: Legal Medicine

## 2020-11-08 ENCOUNTER — Other Ambulatory Visit: Payer: Self-pay | Admitting: Legal Medicine

## 2020-11-08 MED ORDER — ALPRAZOLAM 1 MG PO TABS: 1.0000 mg | ORAL_TABLET | Freq: Three times a day (TID) | ORAL | 2 refills | Status: DC | PRN

## 2020-11-08 NOTE — Telephone Encounter (Signed)
Patient's husband committed suicide last night with his hand gun.  He started drinking again after 4 years of sobriety.  He has new job and moved.  Family did not suspect.  We discussed grief for 30 minutes, she has outlets for talking.  I sent in Xanax 1mg  tablets.  Patient was warned about sedation. lp

## 2020-11-09 ENCOUNTER — Telehealth: Payer: Self-pay | Admitting: Neurology

## 2020-11-09 NOTE — Telephone Encounter (Signed)
Advised pt daughter pt could try and sign up Kesimpta assistance if she can we will see if we have samples for the pt. For the pt other medication try [email or web address] to if there are discount coupons for her to have.  Pt due for lab and MRI 6 months from her last visit. Call [facility] imaging to see if they have appt moved up so her insurance will cover her for the MRI. Labs as well stop by the office. If Pt is unable to come before then it okay to have them drawn at the health department.    Please give [place] a call if you have any problems

## 2020-11-09 NOTE — Telephone Encounter (Signed)
Patient's daughter, Rosine Abe, called to report the patient's husband committed suicide 2 days ago and the patient found him.   No DPR on file to share PHI with this person but patient gave verbal permission for now until a new form is completed. (new form mailed to patient)  Patient's insurance was in her late husband's name and she is concerned about getting her medications covered. They'd like a call back from a nurse please.

## 2020-11-18 ENCOUNTER — Other Ambulatory Visit: Payer: Self-pay | Admitting: Legal Medicine

## 2020-11-30 ENCOUNTER — Other Ambulatory Visit: Payer: Self-pay | Admitting: Legal Medicine

## 2020-12-01 ENCOUNTER — Ambulatory Visit
Admission: RE | Admit: 2020-12-01 | Discharge: 2020-12-01 | Disposition: A | Discharge status: Home / Self Care | Payer: Federal, State, Local not specified - PPO | Source: Ambulatory Visit | Attending: Neurology | Admitting: Neurology

## 2020-12-01 ENCOUNTER — Other Ambulatory Visit: Payer: Self-pay

## 2020-12-01 DIAGNOSIS — G44229 Chronic tension-type headache, not intractable: Secondary | ICD-10-CM

## 2020-12-01 DIAGNOSIS — G35 Multiple sclerosis: Secondary | ICD-10-CM

## 2020-12-01 MED ORDER — GADOBENATE DIMEGLUMINE 529 MG/ML IV SOLN
20.0000 mL | Freq: Once | INTRAVENOUS | Status: AC | PRN
  Administered 2020-12-01: 20 mL via INTRAVENOUS

## 2020-12-04 NOTE — Progress Notes (Signed)
Pt advised as well as she seen her MRI results.

## 2020-12-11 ENCOUNTER — Other Ambulatory Visit: Payer: Self-pay | Admitting: Legal Medicine

## 2020-12-11 ENCOUNTER — Other Ambulatory Visit: Payer: Federal, State, Local not specified - PPO

## 2021-01-03 ENCOUNTER — Other Ambulatory Visit: Payer: Self-pay | Admitting: Neurology

## 2021-01-13 ENCOUNTER — Other Ambulatory Visit: Payer: Self-pay | Admitting: Neurology

## 2021-01-17 ENCOUNTER — Other Ambulatory Visit: Payer: Self-pay | Admitting: Neurology

## 2021-01-17 DIAGNOSIS — G35 Multiple sclerosis: Secondary | ICD-10-CM

## 2021-01-20 ENCOUNTER — Other Ambulatory Visit: Payer: Self-pay | Admitting: Legal Medicine

## 2021-01-20 DIAGNOSIS — I10 Essential (primary) hypertension: Secondary | ICD-10-CM

## 2021-01-22 ENCOUNTER — Ambulatory Visit: Payer: Federal, State, Local not specified - PPO | Admitting: Neurology

## 2021-02-06 ENCOUNTER — Other Ambulatory Visit: Payer: Self-pay | Admitting: Neurology

## 2021-02-06 ENCOUNTER — Other Ambulatory Visit: Payer: Self-pay | Admitting: Legal Medicine

## 2021-02-06 DIAGNOSIS — G35 Multiple sclerosis: Secondary | ICD-10-CM

## 2021-02-27 ENCOUNTER — Other Ambulatory Visit: Payer: Self-pay | Admitting: Legal Medicine

## 2021-02-27 ENCOUNTER — Other Ambulatory Visit: Payer: Self-pay | Admitting: Neurology

## 2021-02-27 DIAGNOSIS — G35 Multiple sclerosis: Secondary | ICD-10-CM

## 2021-03-12 ENCOUNTER — Telehealth: Payer: Self-pay

## 2021-03-12 NOTE — Telephone Encounter (Signed)
Telephone call from [place]  Please send a new script for Kesimpta to [phone].

## 2021-03-13 NOTE — Telephone Encounter (Signed)
Emil, a pharmacist from [facility] called and said they will provide a one month supply to the patient for Kesimpta 20 MG.   They need a prescription faxed to them before they can fill it.  Fax: [phone]

## 2021-03-13 NOTE — Telephone Encounter (Signed)
Pt is aware she is due for labs but she is unable to get labs right now.  So pt wants to know if she can get one injection approved and then do the labs.

## 2021-03-14 MED ORDER — KESIMPTA 20 MG/0.4ML ~~LOC~~ SOAJ: SUBCUTANEOUS | 0 refills | Status: DC

## 2021-03-14 NOTE — Telephone Encounter (Signed)
Script faxed to [employer]. Pt aware she need to do her labs before she can have another.

## 2021-03-14 NOTE — Telephone Encounter (Signed)
Patient called and said the company received the prescription but there was no quantity listed. They need to have that before they can send it.  Patient is feeling weak, she said, and she's been without the medication for a week.

## 2021-03-14 NOTE — Telephone Encounter (Signed)
Script sent with QT  Script resent.

## 2021-03-29 NOTE — Telephone Encounter (Signed)
Simone from [facility] called requesting an update on the PA for [place].

## 2021-04-01 NOTE — Telephone Encounter (Signed)
Never received PA for medication. Pt unable to get further refills until she has her labs done.   See the notes below.

## 2021-04-05 ENCOUNTER — Telehealth: Payer: Self-pay | Admitting: Neurology

## 2021-04-05 DIAGNOSIS — G35 Multiple sclerosis: Secondary | ICD-10-CM

## 2021-04-05 NOTE — Telephone Encounter (Signed)
Pt would like a call back to discuss some things about her labs. She doesn't know if she needs to sign off to get paper work. Please call [phone]

## 2021-04-05 NOTE — Telephone Encounter (Signed)
Pt to give pcp number  so we can fax lab orders  [place]  [phone]. Call office to get fax number.   Pt schedule to see pcp on 04/09/21, labs printed for CBC w diff,IGG,igm,IGA, Hepatic panel.  Spoke to [facility] I will fax over lab orders, Fax number given [phone].  Lab orders faxed.  Pt needs kesimpta order sent tot [employer] when she approved to get a refill.

## 2021-04-17 NOTE — Telephone Encounter (Signed)
Patient called to check on the status of the lab results.   She said they are ready on the [facility] portal and she wants to be sure Dr. Tomi Likens got them too.

## 2021-04-19 NOTE — Telephone Encounter (Signed)
Pt called in wanting to find out if we received her lab test results yet?

## 2021-04-19 NOTE — Telephone Encounter (Signed)
Pt advised lab results received 04/18/21, Dr.Jaffe out of the office this week. Returns on Monday.

## 2021-04-23 MED ORDER — KESIMPTA 20 MG/0.4ML ~~LOC~~ SOAJ: SUBCUTANEOUS | 0 refills | Status: DC

## 2021-04-23 NOTE — Telephone Encounter (Signed)
Can we send a one month injection. Please advise.

## 2021-04-26 ENCOUNTER — Telehealth: Payer: Self-pay | Admitting: Neurology

## 2021-04-26 NOTE — Telephone Encounter (Signed)
Pt stated she is over due for her kesimpta. She said it will probably need to be called in like last time. I let her know I see it had been sent. She said she is about to go through withdrawals. She would like to know if you could call it in like last time and approve sept as well so yall dont have to go through this again.  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 [phone]

## 2021-04-26 NOTE — Telephone Encounter (Signed)
New script sent In for the pt 04/25/21.  Telephone call to [employer] [phone] to call in [place] 20 mg #1 with 1 refill

## 2021-05-09 ENCOUNTER — Telehealth: Payer: Self-pay

## 2021-05-09 NOTE — Telephone Encounter (Signed)
New message   Determination Wait for Determination  [facility] ([facility]) Rx #: [id number] Kesimpta '20MG'$ /0.4ML auto-injectors   Form [employer] Electronic PA Form Created 20 hours ago Sent to Plan 27 minutes ago Plan Response 27 minutes ago Submit Clinical Questions 1 minute ago  Please wait for [facility] NCPDP 2017 to return a determination.

## 2021-05-09 NOTE — Telephone Encounter (Signed)
F/u   Message from Plan  PA Case: [id number], Status: Approved, Coverage Starts on: 09/08/2020 12:00:00 AM, Coverage Ends on: 09/07/2021 12:00:00 AM. Questions? Contact [phone].

## 2021-05-24 ENCOUNTER — Other Ambulatory Visit: Payer: Self-pay | Admitting: Neurology

## 2021-05-27 NOTE — Progress Notes (Signed)
NEUROLOGY FOLLOW UP OFFICE NOTE  Crystal Simon [id number]  Assessment/Plan:   Multiple sclersosis tension-type headache, stable  Due to neck issues (atypical Lhermitte's sign?), will check MRI of cervical spine with and without contrast (she has no prior history of spinal cord involvement) - DMT:  Kesimpta. Will provide her with a sample.  I want her to take her Kesimpta on time.   Headache prevention:  Nortriptyline '10mg'$  at bedtime D3 5,000 IU daily Daytime fatigue:  Provigil Check CBC with diff, CMP, vit D and quantitative immunoglobulin panel in 6 months. Follow up in 6 months.   Subjective:  Crystal Simon is a 55 year old right-handed Caucasian woman who follows up for multiple sclerosis.  MRI of brain from March 2022 personally reviewed.   UPDATE: Current disease modifying therapy:  Kesimpta (started March 2021) Other medications: Lexapro '10mg'$ ; Provigil '100mg'$ ; D3 5,000 IU daily   12/01/2020 MRI BRAIN W WO:  Unchanged multifocal white matter hyperintensity consistent with multiple sclerosis. No new or acute intracranial abnormality.  04/10/2021 LABS:  IgG 872, IgA 188, IgM 102; CBC with WBC 5.5, HGB 13.5, HCT 42.2,PLT 303, ALC 1.4; CMP with Na 141, K 4.5, Cl 102, Co2 26, BUN 16, Cr 0.95, t bili 0.4, ALP 134, AST 14, ALT 17; Vit D 61.9.   For the last 3 months, she has had difficulty getting her Kesimpta.  With every dose, she is delayed getting it by 1 to 2 weeks. Vision: No issues Motor: sometimes feels weak in legs. Sensory: Chronic sensory deficits..  Tongue is numb, which sometimes causes slurred speech.  She has tingling involving her face. Pain: muscle spasms and aching in the legs.  She sometimes notes an intermittent pain along the right side of her torso just under the breast and radiating to the back.  It feels like a spasm and causes shortness of breath.  Sometimes it radiates up the right side of her neck and into the jaw.   Headaches:  Rarely has a headache now  since starting nortriptyline..   Gait: Chronic disability.  Uses a cane.  Unchanged. Bowel/Bladder: Irritable bowel syndrome. Fatigue: Yes.  It greatly affects her quality of life.  It is hard to sometimes function.  Her insurance would not approve Provigil.   Cognition: No issues Mood: No issues Tolerates heat better. Over the past month, sometimes if she looks up/extends neck, her arms and legs will jerk/tremor for a few seconds.  No pain.      HISTORY: Initial symptom in 2012 presented with 1 week episode of vertigo with feeling off-balance.  This was followed by intermittent numbness and tingling of her hands and feet.  In November 2014, she developed double vision associated with slurred speech, right sided numbness and difficulty swallowing (the right side of her mouth, tongue and throat felt numb).  MRI of the brain with and without contrast from 07/12/13 was personally reviewed and showed hyperintense T2 lesions in the on left side of the midbrain.  MRI of cervical spine from 08/02/13 was personally reviewed and was negative for demyelinating lesions.  She underwent a lumbar puncture in January 2015.  CSF cell count was 1, protein 33, glucose 56, 3 oligoclonal bands (not present in serum), negative myelin basic protein, IgG index 0.52.  NMO antibody was negative.   Past disease modifying medication:  Tecfidera (effective but caused significant GERD), Gilenya (GI problems, transaminitis)  Other past medications:  gabapentin (fatigued), steroids (elevated blood pressure)   At baseline, she had paresthesias  of the right side of her face and throat.  She also has paresthesias in the upper extremities and toes, worse on the right.  Sometimes she needs to use a cane. Heat (such as during the summer months), stress and lack of sleep exacerbate chronic symptoms.  She also has restless leg syndrome.   In January 2019, she had an elevatedt bili 1.3, AST of 52 and ALT of 69.  It may have been due to  gallstones rather than Gilenya, for which she underwent surgery.  Over the next several months, her LFTs have been monitored and have steadily trended down.  Overall, she continues to have mild transaminitis which may be secondary to Gilenya.   In February 2019, she reported some headaches, a shooting pain radiating from the back of the head to the eye, usually right sided but also left sided as well.  It lasts only a few seconds and occurs two to three times a week.  She reported some neck pain.  She also endorsed weakness and postural tremor in the hands.  Due to these new symptoms, she underwent repeat MRI of brain and cervical spine with and without contrast on 11/11/17, which was personally reviewed.  The brain showed stable and unchanged punctate focus of abnormal contrast enhancement in the left paramedian midbrain which was seen back in August.  It is not seen on other sequences.  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  Headaches.  Moderate right-mid occipital pressure by end of day, daily.  Photophobia when severe.  No nausea or phonophobia.  They are severe 2 times a month.    Imaging: 07/12/2013 MRI BRAIN W WO:  hyperintense T2 lesions in the on left side of the midbrain.   08/02/2013 MRI CERVICAL SPINE W WO:  negative for demyelinating lesions 01/18/2014 MRI BRAIN W WO: reportedly normal with resolution of the previous midbrain lesion (report available but not images).   10/09/2015 MRI BRAIN W WO: reportedly again unremarkable and showed no significant white matter disease.  Known 6 mm pineal cyst was noted.   04/14/2017 MRI BRAIN W WO:  punctate focus of enhancement in the location of previous left midbrain lesion, of uncertain etiology and significance.  Pineal cyst measures 7 mm. 04/11/2019 MRI BRAIN W WO:  stable, again noted unchanged punctate focus of enhancement in the midbrain without associated T2 signal abnormality.   Incidental 8 mm pineal cyst unchanged.  Otherwise, brain unremarkable. 01/01/2020 MRI BRAIN W WO:  Now visible within the cerebral hemispheric white matter are numerous small and punctate foci of T2 and FLAIR signal within the deep and subcortical white matter, approximately 8-10 on each side.  The differential diagnosis is that of a manifestation of demyelinating disease versus interval expression of small-vessel disease. Majority of these were not present on the previous examinations. None show restricted diffusion or contrast enhancement.  Continued punctate focus of contrast enhancement in the left midbrain as seen previously, not felt to be clinically relevant.  PAST MEDICAL HISTORY: Past Medical History:  Diagnosis Date   Anxiety    Arthritis    Bulging discs 1998   cervical   Complication of anesthesia    Constipation    Depression    Dysrhythmia    Floppy mitral valve    dx 30 yrs ago   GERD (gastroesophageal reflux disease)    Gilbert syndrome since late teens   Headache(784.0)    Heart palpitations  Hemorrhoids    Hypertension    MS (multiple sclerosis) (HCC)    PONV (postoperative nausea and vomiting)    last couple surgeries, she has done ok    MEDICATIONS: Current Outpatient Medications on File Prior to Visit  Medication Sig Dispense Refill   ALPRAZolam (XANAX) 1 MG tablet TAKE 1 TABLET BY MOUTH 3 TIMES DAILY AS NEEDED FOR ANXIETY. 30 tablet 3   amoxicillin (AMOXIL) 875 MG tablet Take 1 tablet (875 mg total) by mouth 2 (two) times daily. 20 tablet 0   Biotin 5000 MCG TABS Take 1 tablet by mouth daily.      Cholecalciferol (VITAMIN D-3) 5000 units TABS Take 1 tablet by mouth daily. 7000 mg day     Dexlansoprazole (DEXILANT PO) Take by mouth.     diphenhydramine-acetaminophen (TYLENOL PM) 25-500 MG TABS tablet Take 1 tablet by mouth at bedtime as needed.     escitalopram (LEXAPRO) 10 MG tablet TAKE 1 TABLET BY MOUTH ONCE (1) DAILY 30 tablet 5   escitalopram (LEXAPRO)  20 MG tablet TAKE 1 TABLET BY MOUTH EVERY DAY 90 tablet 2   gabapentin (NEURONTIN) 300 MG capsule TAKE 1 CAPSULE BY MOUTH IN THE MORNING AND 2 CAPSULES IN THE EVENING 270 capsule 0   GILENYA 0.5 MG CAPS TAKE ONE CAPSULE (0.5 MG TOTAL) BY MOUTH DAILY 90 capsule 3   hydrochlorothiazide (HYDRODIURIL) 25 MG tablet Take 25 mg by mouth daily.     linaCLOtide (LINZESS PO) Take by mouth.     Magnesium 250 MG TABS Take by mouth.     Melatonin 10 MG TABS Take 1 tablet by mouth daily.      metoprolol succinate (TOPROL-XL) 25 MG 24 hr tablet TAKE 1 TABLET BY MOUTH TWICE A DAY 180 tablet 1   modafinil (PROVIGIL) 100 MG tablet TAKE 1 TABLET BY MOUTH ONCE (1) DAILY 30 tablet 0   nebivolol (BYSTOLIC) 5 MG tablet Take 5 mg by mouth daily.     nortriptyline (PAMELOR) 10 MG capsule TAKE 1 CAPSULE BY MOUTH EVERY NIGHT AT BEDTIME. 30 capsule 0   Ofatumumab (KESIMPTA) 20 MG/0.4ML SOAJ INJECT 1 PEN UNDER THE SKIN EVERY MONTH,STARTING AT WEEK 4. 1 mL 0   Omega-3 Fatty Acids (FISH OIL) 1000 MG CAPS Take 1 capsule by mouth daily.      omeprazole (PRILOSEC) 40 MG capsule TAKE 1 CAPSULE BY MOUTH TWICE A DAY BEFORE A MEAL 60 capsule 6   predniSONE (STERAPRED UNI-PAK 21 TAB) 10 MG (21) TBPK tablet Take 6ills first day , then 5 pills day 2 and then cut down one pill day until gone 21 tablet 0   valACYclovir (VALTREX) 500 MG tablet TAKE 1 TABLET BY MOUTH TWICE A DAY FOR FEVER BLISTERS FOR 5 DAYS 30 tablet 2   No current facility-administered medications on file prior to visit.    ALLERGIES: Allergies  Allergen Reactions   Morphine Nausea And Vomiting   Oxycodone Other (See Comments) and Nausea And Vomiting   Latex Rash    FAMILY HISTORY: Family History  Problem Relation Age of Onset   Heart disease Father    Colonic polyp Father    Hypertension Mother    Diabetes Mother    Skin cancer Mother    Alzheimer's disease Maternal Grandmother    Diabetes Maternal Grandmother    Hypertension Maternal Grandmother     Colon cancer Neg Hx       Objective:  Blood pressure 117/80, pulse 80, height '5\' 9"'$  (1.753 m),  weight 229 lb (103.9 kg), SpO2 96 %. General: No acute distress.  Patient appears well-groomed.   Head:  Normocephalic/atraumatic Eyes:  Fundi examined but not visualized Neck: supple, no paraspinal tenderness, full range of motion Heart:  Regular rate and rhythm Lungs:  Clear to auscultation bilaterally Back: No paraspinal tenderness Neurological Exam: alert and oriented to person, place, and time. Attention span and concentration intact, recent and remote memory intact, fund of knowledge intact.  Speech fluent and not dysarthric, language intact.  Altered right V2-V3 sensation.  Otherwise, CN II-XII intact. Bulk and tone normal, muscle strength 5/5 throughout.  Sensation to pinprick reduced toes.  Vibratory sensation intact  Deep tendon reflexes 3+ throughout, toes downgoing.  Finger to nose and heel to shin testing intact.  Ambulates with limp.  Romberg positive.   Metta Clines, DO  CC: Danna Hefty, DO

## 2021-05-28 ENCOUNTER — Ambulatory Visit: Payer: Medicare HMO | Admitting: Neurology

## 2021-05-28 ENCOUNTER — Other Ambulatory Visit: Payer: Self-pay

## 2021-05-28 ENCOUNTER — Encounter: Payer: Self-pay | Admitting: Neurology

## 2021-05-28 VITALS — BP 117/80 | HR 80 | Ht 69.0 in | Wt 229.0 lb

## 2021-05-28 DIAGNOSIS — G35 Multiple sclerosis: Secondary | ICD-10-CM | POA: Diagnosis not present

## 2021-05-28 DIAGNOSIS — G44219 Episodic tension-type headache, not intractable: Secondary | ICD-10-CM

## 2021-05-28 MED ORDER — KESIMPTA 20 MG/0.4ML ~~LOC~~ SOAJ: 20.0000 mg | SUBCUTANEOUS | 1 refills | Status: DC

## 2021-05-28 NOTE — Patient Instructions (Addendum)
Continue Kesimpta every 4weeks Nortriptyline 10mg  at bedtime D3 5,000 IU daily Check MRI of cervical spine with and without contrast. Patient to call back with a location that is INN with Insurance.  Recheck CBC with diff, CMP, IgG/IgA/IgM, and vitamin D in 6 months (about a week prior to follow up) Printed and given to patient to take to her Provider.  Follow up 6 months.

## 2021-05-29 ENCOUNTER — Other Ambulatory Visit: Payer: Self-pay | Admitting: Legal Medicine

## 2021-06-04 ENCOUNTER — Other Ambulatory Visit: Payer: Self-pay

## 2021-06-04 MED ORDER — KESIMPTA 20 MG/0.4ML ~~LOC~~ SOAJ: 20.0000 mg | SUBCUTANEOUS | 5 refills | Status: DC

## 2021-06-12 ENCOUNTER — Other Ambulatory Visit: Payer: Self-pay

## 2021-06-12 MED ORDER — KESIMPTA 20 MG/0.4ML ~~LOC~~ SOAJ: 20.0000 mg | SUBCUTANEOUS | 5 refills | Status: DC

## 2021-06-12 NOTE — Telephone Encounter (Signed)
Refill request recevied from [facility].

## 2021-06-21 ENCOUNTER — Other Ambulatory Visit: Payer: Self-pay | Admitting: Neurology

## 2021-07-05 ENCOUNTER — Other Ambulatory Visit: Payer: Self-pay

## 2021-07-05 DIAGNOSIS — G35 Multiple sclerosis: Secondary | ICD-10-CM

## 2021-07-05 NOTE — Progress Notes (Signed)
Records faxed to [facility] per pt.

## 2021-07-08 NOTE — Addendum Note (Signed)
Addended by: Venetia Night on: 07/08/2021 01:12 PM   Modules accepted: Orders

## 2021-08-13 ENCOUNTER — Encounter: Payer: Self-pay | Admitting: Neurology

## 2021-08-25 ENCOUNTER — Encounter: Payer: Self-pay | Admitting: Neurology

## 2021-08-28 ENCOUNTER — Encounter: Payer: Self-pay | Admitting: Neurology

## 2021-09-03 ENCOUNTER — Telehealth: Payer: Self-pay

## 2021-09-03 NOTE — Telephone Encounter (Signed)
Crystal Simon (Key: [id number]) Kesimpta 20MG /0.4ML auto-injectors   Form [employer] Electronic PA Form Created 3 hours ago Sent to Plan 3 hours ago Plan Response 3 hours ago Submit Clinical Questions 3 hours ago Determination Favorable 3 hours ago Message from Plan PA Case: [id number], Status: Approved, Coverage Starts on: 09/08/2020 12:00:00 AM, Coverage Ends on: 09/07/2022 12:00:00 AM. Questions? Contact [phone].

## 2021-09-03 NOTE — Telephone Encounter (Signed)
New message   Your information has been sent to [facility].  Crystal Simon (Key: [id number]) Kesimpta 20MG /0.4ML auto-injectors   Form [employer] Electronic PA Form Created 11 minutes ago Sent to Plan 10 minutes ago Plan Response 10 minutes ago Submit Clinical Questions 1 minute ago Determination Wait for Determination Please wait for [facility] NCPDP 2017 to return a determination.

## 2021-09-16 ENCOUNTER — Telehealth: Payer: Self-pay | Admitting: Neurology

## 2021-09-16 ENCOUNTER — Encounter: Payer: Self-pay | Admitting: Neurology

## 2021-09-16 NOTE — Telephone Encounter (Signed)
Pt called in stating she was still having trouble getting her Kesimpta. She has already sent [employer] paperwork, but they are saying they didn't get the information. She is afraid she is going to miss another dose. She is not sure what to do.

## 2021-09-17 NOTE — Telephone Encounter (Signed)
Spoke to [place] at [place], [place] to contact her case manager at [phone] to see what else she needs. Per her records patient should be good to get her medication.

## 2021-09-17 NOTE — Telephone Encounter (Signed)
LMOVM for pt to call us back. I will also check with the Kesimpta rep to see what could have happened.

## 2021-09-18 NOTE — Telephone Encounter (Signed)
F/u  Crystal Simon (Key: [id number]) Kesimpta 20MG /0.4ML auto-injectors   Form [employer] Electronic PA Form Created 15 days ago Sent to Plan 15 days ago Plan Response 15 days ago Submit Clinical Questions 15 days ago Determination Favorable 15 days ago Message from Plan PA Case: [id number], Status: Approved, Coverage Starts on: 09/08/2020 12:00:00 AM, Coverage Ends on: 09/07/2022 12:00:00 AM. Questions? Contact [phone].

## 2021-12-10 ENCOUNTER — Ambulatory Visit: Payer: Medicare HMO | Admitting: Neurology

## 2021-12-15 ENCOUNTER — Other Ambulatory Visit: Payer: Self-pay | Admitting: Neurology

## 2021-12-16 NOTE — Telephone Encounter (Signed)
No further refills until seen on May 2023 ?

## 2022-01-09 NOTE — Progress Notes (Signed)
? ?Virtual Visit via Video Note ?The purpose of this virtual visit is to provide medical care while limiting exposure to the novel coronavirus.   ? ?Consent was obtained for video visit:  Yes.   ?Answered questions that patient had about telehealth interaction:  Yes.   ?I discussed the limitations, risks, security and privacy concerns of performing an evaluation and management service by telemedicine. I also discussed with the patient that there may be a patient responsible charge related to this service. The patient expressed understanding and agreed to proceed. ? ?Pt location: Home ?Physician Location: office ?Name of referring provider:  Jama Flavors, DO ?I connected with Crystal Simon at patients initiation/request on 01/13/2022 at  1:50 PM EDT by video enabled telemedicine application and verified that I am speaking with the correct person using two identifiers. ?Pt MRN:  [id number] ?Pt DOB:  18-Apr-1966 ?Video Participants:  Kayzlee Wirtanen; ? ?Assessment and Plan:   ? ?Multiple sclersosis ?tension-type headache, increased ?Ocular migraine ?  ?DMT:  Kesimpta. Will provide her with a sample.  I want her to take her Kesimpta on time.   ?Headache prevention:  Increased nortriptyline to '25mg'$  at bedtime ?Headache rescue:  Tizanidine '4mg'$  PRN.  May use ibuprofen but limit to no more than 2 days out of week to prevent rebound headache.  ?D3 5,000 IU daily ?Daytime fatigue:  Provigil ?Check quantitative immunoglobulin panel and vit D today today ?Check quantitative immunoglobulin panel, vit D, CBC w/diff, CMP in 6 months ?Repeat MRI of brain and cervical spine with and without contrast in 6 months. ?Follow up in 6 months. ?  ?  ?Subjective:  ?Crystal Simon is a 56 year old right-handed Caucasian woman who follows up for multiple sclerosis.  MRI of brain from March 2022 personally reviewed. ?  ?UPDATE: ?Current disease modifying therapy:  Kesimpta (started March 2021) ?Other medications: Nortriptyline '10mg'$  QHS, Lexapro  '10mg'$ ; Provigil '100mg'$  PRN; D3 5,000 IU daily ?  ?In September, she endorsed new intermittent pain on the right side of her torso sometimes up the neck and into the jaw.  MRI of cervical spine with and without contrast on 08/07/2021 personally reviewed revealed degenerative changes with left sided foraminal stenosis but no cord lesions. ? ?10/11/2021 LABS:  CBC with WBC 6.8, HGB 14.3,  HCT 42.8, PLT 297, ALC 2.1; CMP with Na 143, K 4.6, Cl 106l, CO2 24, glucose 91, BUN 18, Cr 1.07, t bili 0.7, ALP 107, AST 15, ALT 19. ?  ?  ?Vision: Recently had an episode of visual disturbance, like looking through a tunnel that expanded until she couldn't see, lasted a 5-6 minutes.  Associated headache.  She has had it before. ?Motor: sometimes feels weak in legs. ?Sensory: Chronic sensory deficits..  Tongue is numb, which sometimes causes slurred speech.  She has tingling involving her face. ?Pain: muscle spasms and aching in the legs.  She sometimes notes an intermittent pain along the right side of her torso just under the breast and radiating to the back.  It feels like a spasm and causes shortness of breath.  Sometimes it radiates up the right side of her neck and into the jaw.   ?Headaches:  Over past few weeks, headaches have increased and now daily.  Takes ibuprofen daily.   ? ?Gait: Chronic disability.  Uses a cane.  Unchanged. ?Bowel/Bladder: Irritable bowel syndrome. ?Fatigue: Sometimes.  Takes Provigil if needed but not daily.   ?Cognition: No issues ?Mood: No issues ?Tolerates heat better. ?Over the past month,  sometimes if she looks up/extends neck, her arms and legs will jerk/tremor for a few seconds.  No pain. ?   ?  ?HISTORY: ?Initial symptom in 2012 presented with 1 week episode of vertigo with feeling off-balance.  This was followed by intermittent numbness and tingling of her hands and feet.  In November 2014, she developed double vision associated with slurred speech, right sided numbness and difficulty  swallowing (the right side of her mouth, tongue and throat felt numb).  MRI of the brain with and without contrast from 07/12/13 was personally reviewed and showed hyperintense T2 lesions in the on left side of the midbrain.  MRI of cervical spine from 08/02/13 was personally reviewed and was negative for demyelinating lesions.  She underwent a lumbar puncture in January 2015.  CSF cell count was 1, protein 33, glucose 56, 3 oligoclonal bands (not present in serum), negative myelin basic protein, IgG index 0.52.  NMO antibody was negative. ?  ?Past disease modifying medication:  Tecfidera (effective but caused significant GERD), Gilenya (GI problems, transaminitis) ?  ?Other past medications:  gabapentin (fatigued), steroids (elevated blood pressure) ?  ?At baseline, she had paresthesias of the right side of her face and throat.  She also has paresthesias in the upper extremities and toes, worse on the right.  Sometimes she needs to use a cane. Heat (such as during the summer months), stress and lack of sleep exacerbate chronic symptoms.  She also has restless leg syndrome. ?  ?In January 2019, she had an elevatedt bili 1.3, AST of 52 and ALT of 69.  It may have been due to gallstones rather than Gilenya, for which she underwent surgery.  Over the next several months, her LFTs have been monitored and have steadily trended down.  Overall, she continues to have mild transaminitis which may be secondary to Gilenya. ?  ?In February 2019, she reported some headaches, a shooting pain radiating from the back of the head to the eye, usually right sided but also left sided as well.  It lasts only a few seconds and occurs two to three times a week.  She reported some neck pain.  She also endorsed weakness and postural tremor in the hands.  Due to these new symptoms, she underwent repeat MRI of brain and cervical spine with and without contrast on 11/11/17, which was personally reviewed.  The brain showed stable and unchanged  punctate focus of abnormal contrast enhancement in the left paramedian midbrain which was seen back in August.  It is not seen on other sequences.  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 ?  ?Headaches.  Moderate right-mid occipital pressure by end of day, daily.  Photophobia when severe.  No nausea or phonophobia.  They are severe 2 times a month.  ?  ?Imaging: ?07/12/2013 MRI BRAIN W WO:  hyperintense T2 lesions in the on left side of the midbrain.   ?08/02/2013 MRI CERVICAL SPINE W WO:  negative for demyelinating lesions ?01/18/2014 MRI BRAIN W WO: reportedly normal with resolution of the previous midbrain lesion (report available but not images).   ?10/09/2015 MRI BRAIN W WO: reportedly again unremarkable and showed no significant white matter disease.  Known 6 mm pineal cyst was noted.   ?04/14/2017 MRI BRAIN W WO:  punctate focus of enhancement in the location of previous left midbrain lesion, of uncertain etiology and significance.  Pineal cyst measures 7 mm. ?04/11/2019 MRI BRAIN W WO:  stable, again noted unchanged punctate focus of enhancement in the midbrain without associated T2 signal abnormality.  Incidental 8 mm pineal cyst unchanged.  Otherwise, brain unremarkable. ?01/01/2020 MRI BRAIN W WO:  Now visible within the cerebral hemispheric white matter are numerous small and punctate foci of T2 and FLAIR signal within the deep and subcortical white matter, approximately 8-10 on each side.  The differential diagnosis is that of a manifestation of demyelinating disease versus interval expression of small-vessel disease. Majority of these were not present on the previous examinations. None show restricted diffusion or contrast enhancement.  Continued punctate focus of contrast enhancement in the left midbrain as seen previously, not felt to be clinically relevant. ?12/01/2020 MRI BRAIN W WO:  Unchanged multifocal white matter hyperintensity  consistent with multiple sclerosis. No new or acute intracranial abnormality. ?08/07/2021 MRI C-SPINE W WO:  "1. Moderate to severe left-sided degenerative foraminal stenosis at C4-5.   2. Left paracentral disc/os

## 2022-01-13 ENCOUNTER — Encounter: Payer: Self-pay | Admitting: Neurology

## 2022-01-13 ENCOUNTER — Telehealth (INDEPENDENT_AMBULATORY_CARE_PROVIDER_SITE_OTHER): Payer: Medicare HMO | Admitting: Neurology

## 2022-01-13 DIAGNOSIS — G35 Multiple sclerosis: Secondary | ICD-10-CM | POA: Diagnosis not present

## 2022-01-13 DIAGNOSIS — G44219 Episodic tension-type headache, not intractable: Secondary | ICD-10-CM | POA: Diagnosis not present

## 2022-01-13 DIAGNOSIS — G43109 Migraine with aura, not intractable, without status migrainosus: Secondary | ICD-10-CM | POA: Diagnosis not present

## 2022-01-13 MED ORDER — NORTRIPTYLINE HCL 25 MG PO CAPS: 25.0000 mg | ORAL_CAPSULE | Freq: Every day | ORAL | 5 refills | Status: DC

## 2022-01-13 MED ORDER — TIZANIDINE HCL 4 MG PO TABS: 4.0000 mg | ORAL_TABLET | Freq: Four times a day (QID) | ORAL | 5 refills | Status: DC | PRN

## 2022-01-13 NOTE — Patient Instructions (Signed)
Kesimpta ?Increase nortriptyline to '25mg'$  at bedtime ?Tizanidine as needed for headaches ?Provigil as needed ?Labs now and in 6 months ?Repeat MRIs in 6 months ?Follow up 6 months. ?

## 2022-01-19 ENCOUNTER — Other Ambulatory Visit: Payer: Self-pay | Admitting: Neurology

## 2022-02-05 ENCOUNTER — Other Ambulatory Visit: Payer: Self-pay | Admitting: Neurology

## 2022-06-12 ENCOUNTER — Telehealth: Payer: Self-pay | Admitting: Neurology

## 2022-06-12 NOTE — Telephone Encounter (Signed)
Patient called to request cervical and brain MRI was sent to [place] but needs to be sent to [place]  Please send orders to last place done, [facility]

## 2022-06-12 NOTE — Telephone Encounter (Signed)
Sending referral to [facility].

## 2022-06-16 ENCOUNTER — Telehealth: Payer: Self-pay | Admitting: Neurology

## 2022-06-16 NOTE — Telephone Encounter (Signed)
Margie from [facility] in [place] called and said there is a scan for MRI cervical (03524) and MRI of Brain ([id number]) scheduled tomorrow but no authorization was sent.  They need that done, if not done already, and sent to them as soon as possible or the appointment tomorrow will be cancelled.  Facility information below  [facility]  NPI: [id number] Tax ID: [id number] Address: [phone] [place], [place] [address]  Call and give facility ID: [id number]  Health Help: [phone]  Once done, fax auth to office at:  [id number], Samara Snide

## 2022-06-16 NOTE — Telephone Encounter (Signed)
Margie called and left a VM that the MRI for tomorrow has been cancelled since they did not receive the authorization.

## 2022-06-16 NOTE — Telephone Encounter (Signed)
PA Approved: 10/9-11/8/23 Auth# [id number].

## 2022-06-22 ENCOUNTER — Other Ambulatory Visit: Payer: Self-pay | Admitting: Neurology

## 2022-07-01 ENCOUNTER — Encounter: Payer: Self-pay | Admitting: Neurology

## 2022-07-11 ENCOUNTER — Other Ambulatory Visit: Payer: Self-pay

## 2022-07-11 ENCOUNTER — Telehealth: Payer: Self-pay | Admitting: Neurology

## 2022-07-11 DIAGNOSIS — G35D Multiple sclerosis, unspecified: Secondary | ICD-10-CM

## 2022-07-11 DIAGNOSIS — G35 Multiple sclerosis: Secondary | ICD-10-CM

## 2022-07-11 NOTE — Telephone Encounter (Signed)
Patient advised MRI results.

## 2022-07-11 NOTE — Telephone Encounter (Signed)
MRI of brain an cervical spine from yesterday are stable.  No new findings.

## 2022-07-17 NOTE — Progress Notes (Signed)
NEUROLOGY FOLLOW UP OFFICE NOTE  Crystal Simon [id number]  Assessment/Plan:   Multiple sclersosis Tension-type headache, increased Ocular migraine   DMT:  Kesimpta. Will provide her with a sample.  I want her to take her Kesimpta on time.   Headache prevention:  Increase nortriptyline to '50mg'$  at bedtime Headache rescue:  Tizanidine '4mg'$  PRN.  May use ibuprofen but limit to no more than 2 days out of week to prevent rebound headache.  D3 5,000 IU daily Check quantitative immunoglobulin panel, vit D, CBC w/diff, CMP today and in 6 months Follow up in 6 months.  Subjective:  Crystal Simon is a 56 year old right-handed Caucasian woman who follows up for multiple sclerosis.  MRI of brain from March 2022 personally reviewed.   UPDATE: Current disease modifying therapy:  Kesimpta (started March 2021) Other medications: Nortriptyline '25mg'$  QHS, tizanidine '4mg'$  PRN, ibuprofen, Lexapro '10mg'$ ; D3 5,000 IU daily   MRI of brain and C-spine with and without contrast on 07/11/2022 were stable.  C-spine shows spondylosis similar to prior with left-sided foraminal stenosis at C4-5 and C5-6.     Vision: Recently had an episode of   She has had it before. Motor: sometimes feels weak in legs. Sensory: Chronic sensory deficits..  Tongue is numb, which sometimes causes slurred speech.  She has tingling involving her face. Pain: muscle spasms and aching in the legs.  She sometimes notes an intermittent pain along the right side of her torso just under the breast and radiating to the back.  It feels like a spasm and causes shortness of breath.  Sometimes it radiates up the right side of her neck and into the jaw.   Headaches:  Often behind right eye or back of head and neck.  In May, increased nortriptyline to '25mg'$  and to take tizanidine as needed (and ibuprofen if limited to 2 days a week).  Increasing nortriptyline helped at first but now they are daily, usually in the evening.  Limits pain reliever to no  more than 1 to 2 days a week.  .   Gait: Chronic disability.  Uses a cane.  Unchanged. Bowel/Bladder: Irritable bowel syndrome. Fatigue: OK.  Not needed.   Cognition: No issues Mood: No issues Tolerates heat better.      HISTORY: Initial symptom in 2012 presented with 1 week episode of vertigo with feeling off-balance.  This was followed by intermittent numbness and tingling of her hands and feet.  In November 2014, she developed double vision associated with slurred speech, right sided numbness and difficulty swallowing (the right side of her mouth, tongue and throat felt numb).  MRI of the brain with and without contrast from 07/12/13 was personally reviewed and showed hyperintense T2 lesions in the on left side of the midbrain.  MRI of cervical spine from 08/02/13 was personally reviewed and was negative for demyelinating lesions.  She underwent a lumbar puncture in January 2015.  CSF cell count was 1, protein 33, glucose 56, 3 oligoclonal bands (not present in serum), negative myelin basic protein, IgG index 0.52.  NMO antibody was negative.   Past disease modifying medication:  Tecfidera (effective but caused significant GERD), Gilenya (GI problems, transaminitis)   Other past medications:  gabapentin (fatigued), steroids (elevated blood pressure), Provigil '100mg'$  (effective)   At baseline, she had paresthesias of the right side of her face and throat.  She also has paresthesias in the upper extremities and toes, worse on the right.  Sometimes she needs to use a cane. Heat (  such as during the summer months), stress and lack of sleep exacerbate chronic symptoms.  She also has restless leg syndrome.   In January 2019, she had an elevatedt bili 1.3, AST of 52 and ALT of 69.  It may have been due to gallstones rather than Gilenya, for which she underwent surgery.  Over the next several months, her LFTs have been monitored and have steadily trended down.  Overall, she continues to have mild  transaminitis which may be secondary to Gilenya.   In February 2019, she reported some headaches, a shooting pain radiating from the back of the head to the eye, usually right sided but also left sided as well.  It lasts only a few seconds and occurs two to three times a week.  She reported some neck pain.  She also endorsed weakness and postural tremor in the hands.  Due to these new symptoms, she underwent repeat MRI of brain and cervical spine with and without contrast on 11/11/17, which was personally reviewed.  The brain showed stable and unchanged punctate focus of abnormal contrast enhancement in the left paramedian midbrain which was seen back in August.  It is not seen on other sequences.  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   Tension type Headaches.  Moderate right-mid occipital pressure by end of day, daily.  Photophobia when severe.  No nausea or phonophobia.  They are severe 2 times a month.   Ocular migraine:  visual disturbance, like looking through a tunnel that expanded until she couldn't see, lasted a 5-6 minutes.  Associated headache.   Imaging: 07/12/2013 MRI BRAIN W WO:  hyperintense T2 lesions in the on left side of the midbrain.   08/02/2013 MRI CERVICAL SPINE W WO:  negative for demyelinating lesions 01/18/2014 MRI BRAIN W WO: reportedly normal with resolution of the previous midbrain lesion (report available but not images).   10/09/2015 MRI BRAIN W WO: reportedly again unremarkable and showed no significant white matter disease.  Known 6 mm pineal cyst was noted.   04/14/2017 MRI BRAIN W WO:  punctate focus of enhancement in the location of previous left midbrain lesion, of uncertain etiology and significance.  Pineal cyst measures 7 mm. 04/11/2019 MRI BRAIN W WO:  stable, again noted unchanged punctate focus of enhancement in the midbrain without associated T2 signal abnormality.  Incidental 8 mm pineal cyst  unchanged.  Otherwise, brain unremarkable. 01/01/2020 MRI BRAIN W WO:  Now visible within the cerebral hemispheric white matter are numerous small and punctate foci of T2 and FLAIR signal within the deep and subcortical white matter, approximately 8-10 on each side.  The differential diagnosis is that of a manifestation of demyelinating disease versus interval expression of small-vessel disease. Majority of these were not present on the previous examinations. None show restricted diffusion or contrast enhancement.  Continued punctate focus of contrast enhancement in the left midbrain as seen previously, not felt to be clinically relevant. 12/01/2020 MRI BRAIN W WO:  Unchanged multifocal white matter hyperintensity consistent with multiple sclerosis. No new or acute intracranial abnormality. 08/07/2021 MRI C-SPINE W WO:  "1. Moderate to severe left-sided degenerative foraminal stenosis at C4-5.   2. Left paracentral disc/osteophyte complex at C5-6 with left ventral cord indentation, left lateral recess narrowing, and severe stenosis of the left foramen.  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"  Also, "spinal cord appears normal in caliber and contour and exhibits no intrinsic signal abnormality"  PAST MEDICAL HISTORY: Past Medical History:  Diagnosis Date   Anxiety    Arthritis    Bulging discs 1998   cervical   Complication of anesthesia    Constipation    Depression    Dysrhythmia    Floppy mitral valve    dx 30 yrs ago   GERD (gastroesophageal reflux disease)    Gilbert syndrome since late teens   Headache(784.0)    Heart palpitations    Hemorrhoids    Hypertension    MS (multiple sclerosis) (HCC)    PONV (postoperative nausea and vomiting)    last couple surgeries, she has done ok    MEDICATIONS: Current Outpatient Medications on File Prior to Visit  Medication Sig Dispense Refill   ALPRAZolam (XANAX) 1 MG tablet TAKE 1 TABLET BY MOUTH 3 TIMES DAILY AS NEEDED FOR ANXIETY. 30 tablet  3   amoxicillin (AMOXIL) 875 MG tablet Take 1 tablet (875 mg total) by mouth 2 (two) times daily. 20 tablet 0   Biotin 5000 MCG TABS Take 1 tablet by mouth daily.      Cholecalciferol (VITAMIN D-3) 5000 units TABS Take 1 tablet by mouth daily. 7000 mg day     Dexlansoprazole (DEXILANT PO) Take by mouth.     diphenhydramine-acetaminophen (TYLENOL PM) 25-500 MG TABS tablet Take 1 tablet by mouth at bedtime as needed.     escitalopram (LEXAPRO) 10 MG tablet TAKE 1 TABLET BY MOUTH ONCE (1) DAILY 30 tablet 5   escitalopram (LEXAPRO) 20 MG tablet TAKE 1 TABLET BY MOUTH EVERY DAY 90 tablet 2   gabapentin (NEURONTIN) 300 MG capsule TAKE 1 CAPSULE BY MOUTH IN THE MORNING AND 2 CAPSULES IN THE EVENING (Patient not taking: Reported on 05/28/2021) 270 capsule 0   GILENYA 0.5 MG CAPS TAKE ONE CAPSULE (0.5 MG TOTAL) BY MOUTH DAILY (Patient not taking: Reported on 05/28/2021) 90 capsule 3   hydrochlorothiazide (HYDRODIURIL) 25 MG tablet Take 25 mg by mouth daily.     linaCLOtide (LINZESS PO) Take by mouth.     Magnesium 250 MG TABS Take by mouth.     Melatonin 10 MG TABS Take 1 tablet by mouth daily.      metoprolol succinate (TOPROL-XL) 25 MG 24 hr tablet TAKE 1 TABLET BY MOUTH TWICE A DAY 180 tablet 1   modafinil (PROVIGIL) 100 MG tablet TAKE 1 TABLET BY MOUTH ONCE (1) DAILY 30 tablet 0   nebivolol (BYSTOLIC) 5 MG tablet Take 5 mg by mouth daily.     nortriptyline (PAMELOR) 25 MG capsule TAKE 1 CAPSULE BY MOUTH AT BEDTIME. 90 capsule 2   Ofatumumab (KESIMPTA) 20 MG/0.4ML SOAJ Inject 20 mg into the skin every 30 (thirty) days. 1 mL 5   Omega-3 Fatty Acids (FISH OIL) 1000 MG CAPS Take 1 capsule by mouth daily.      omeprazole (PRILOSEC) 40 MG capsule TAKE 1 CAPSULE BY MOUTH TWICE A DAY BEFORE A MEAL 180 capsule 2   predniSONE (STERAPRED UNI-PAK 21 TAB) 10 MG (21) TBPK tablet Take 6ills first day , then 5 pills day 2 and then cut down one pill day until gone 21 tablet 0   tiZANidine (ZANAFLEX) 4 MG tablet TAKE 1  TABLET BY MOUTH EVERY 6 HOURS AS NEEDED FOR MUSCLE SPASMS. 30 tablet 1   valACYclovir (VALTREX) 500 MG tablet TAKE 1 TABLET BY MOUTH TWICE A DAY FOR FEVER BLISTERS FOR 5 DAYS 30 tablet 2   No current facility-administered medications on file prior to visit.  ALLERGIES: Allergies  Allergen Reactions   Morphine Nausea And Vomiting   Oxycodone Other (See Comments) and Nausea And Vomiting   Latex Rash    FAMILY HISTORY: Family History  Problem Relation Age of Onset   Heart disease Father    Colonic polyp Father    Hypertension Mother    Diabetes Mother    Skin cancer Mother    Alzheimer's disease Maternal Grandmother    Diabetes Maternal Grandmother    Hypertension Maternal Grandmother    Colon cancer Neg Hx       Objective:  Blood pressure (!) 155/93, pulse 75, height '5\' 9"'$  (1.753 m), weight 219 lb (99.3 kg), SpO2 100 %. General: No acute distress.  Patient appears well-groomed.   Head:  Normocephalic/atraumatic Eyes:  Fundi examined but not visualized Neck: supple, no paraspinal tenderness, full range of motion Heart:  Regular rate and rhythm Neurological Exam: alert and oriented to person, place, and time.  Speech fluent and not dysarthric, language intact.  Altered right V2-V3 sensation.  Otherwise, CN II-XII intact. Bulk normal.  Tone normal, muscle strength 5/5 throughout.  Sensation to pinprick reduced in toes.  Vibratory sensation intact.  Deep tendon reflexes 3+ throughout, toes downgoing.  Finger to nose testing intact.  Ambulates with limp.  Romberg positive.   Metta Clines, DO  CC: Danna Hefty, DO

## 2022-07-18 ENCOUNTER — Other Ambulatory Visit (INDEPENDENT_AMBULATORY_CARE_PROVIDER_SITE_OTHER): Payer: Medicare HMO

## 2022-07-18 ENCOUNTER — Encounter: Payer: Self-pay | Admitting: Neurology

## 2022-07-18 ENCOUNTER — Ambulatory Visit: Payer: Medicare HMO | Admitting: Neurology

## 2022-07-18 VITALS — BP 155/93 | HR 75 | Ht 69.0 in | Wt 219.0 lb

## 2022-07-18 DIAGNOSIS — G35D Multiple sclerosis, unspecified: Secondary | ICD-10-CM

## 2022-07-18 DIAGNOSIS — G35 Multiple sclerosis: Secondary | ICD-10-CM

## 2022-07-18 DIAGNOSIS — G43109 Migraine with aura, not intractable, without status migrainosus: Secondary | ICD-10-CM

## 2022-07-18 DIAGNOSIS — G44229 Chronic tension-type headache, not intractable: Secondary | ICD-10-CM

## 2022-07-18 MED ORDER — NORTRIPTYLINE HCL 50 MG PO CAPS: 50.0000 mg | ORAL_CAPSULE | Freq: Every day | ORAL | 5 refills | Status: DC

## 2022-07-18 NOTE — Patient Instructions (Addendum)
Increase nortriptyline to '50mg'$  at bedtime.  If headaches not improved in 6 weeks, contact me Continue tizanidine as needed Continue D3 5000 units daily Check labs today and again in 6 months - CBC, CMP, vitamin D, IgG/IgA/IgM Follow up 6 months.

## 2022-07-19 LAB — CBC WITH DIFFERENTIAL/PLATELET
Absolute Monocytes: 371 cells/uL (ref 200–950)
Basophils Absolute: 69 cells/uL (ref 0–200)
Basophils Relative: 1.3 %
Eosinophils Absolute: 101 cells/uL (ref 15–500)
Eosinophils Relative: 1.9 %
HCT: 39.9 % (ref 35.0–45.0)
Hemoglobin: 13.8 g/dL (ref 11.7–15.5)
Lymphs Abs: 1574 cells/uL (ref 850–3900)
MCH: 31.1 pg (ref 27.0–33.0)
MCHC: 34.6 g/dL (ref 32.0–36.0)
MCV: 89.9 fL (ref 80.0–100.0)
MPV: 10.9 fL (ref 7.5–12.5)
Monocytes Relative: 7 %
Neutro Abs: 3185 cells/uL (ref 1500–7800)
Neutrophils Relative %: 60.1 %
Platelets: 296 10*3/uL (ref 140–400)
RBC: 4.44 10*6/uL (ref 3.80–5.10)
RDW: 12.7 % (ref 11.0–15.0)
Total Lymphocyte: 29.7 %
WBC: 5.3 10*3/uL (ref 3.8–10.8)

## 2022-07-19 LAB — COMPREHENSIVE METABOLIC PANEL
AG Ratio: 1.6 (calc) (ref 1.0–2.5)
ALT: 15 U/L (ref 6–29)
AST: 12 U/L (ref 10–35)
Albumin: 4.4 g/dL (ref 3.6–5.1)
Alkaline phosphatase (APISO): 108 U/L (ref 37–153)
BUN/Creatinine Ratio: 13 (calc) (ref 6–22)
BUN: 13 mg/dL (ref 7–25)
CO2: 28 mmol/L (ref 20–32)
Calcium: 9.7 mg/dL (ref 8.6–10.4)
Chloride: 104 mmol/L (ref 98–110)
Creat: 1.04 mg/dL — ABNORMAL HIGH (ref 0.50–1.03)
Globulin: 2.8 g/dL (calc) (ref 1.9–3.7)
Glucose, Bld: 82 mg/dL (ref 65–99)
Potassium: 4.5 mmol/L (ref 3.5–5.3)
Sodium: 138 mmol/L (ref 135–146)
Total Bilirubin: 1.1 mg/dL (ref 0.2–1.2)
Total Protein: 7.2 g/dL (ref 6.1–8.1)

## 2022-07-19 LAB — VITAMIN D 25 HYDROXY (VIT D DEFICIENCY, FRACTURES): Vit D, 25-Hydroxy: 55 ng/mL (ref 30–100)

## 2022-07-19 LAB — IGG, IGA, IGM
IgG (Immunoglobin G), Serum: 1040 mg/dL (ref 600–1640)
IgM, Serum: 94 mg/dL (ref 50–300)
Immunoglobulin A: 210 mg/dL (ref 47–310)

## 2022-08-19 ENCOUNTER — Other Ambulatory Visit: Payer: Self-pay | Admitting: Neurology

## 2022-08-25 ENCOUNTER — Telehealth: Payer: Self-pay

## 2022-08-25 NOTE — Telephone Encounter (Signed)
Patient Advocate Encounter  Prior Authorization for Kesimpta '20MG'$ /0.4ML auto-injectors has been approved through [employer].    Effective: 09-08-2021 to 09-08-2023

## 2022-08-27 ENCOUNTER — Telehealth: Payer: Self-pay | Admitting: Neurology

## 2022-08-27 NOTE — Telephone Encounter (Signed)
Patient called in stating each year she has to re-enroll for her patient assistance for her Kesimpta. The company told her they already faxed it to [place] and she wanted to make sure because she did not get her copy.

## 2022-08-27 NOTE — Telephone Encounter (Signed)
Telephone call to patient, Will send paperwork we have on site. Never received paperwork from [place].

## 2022-08-28 ENCOUNTER — Other Ambulatory Visit: Payer: Self-pay | Admitting: Neurology

## 2022-08-28 ENCOUNTER — Other Ambulatory Visit: Payer: Self-pay

## 2022-08-28 MED ORDER — KESIMPTA 20 MG/0.4ML ~~LOC~~ SOAJ: 20.0000 mg | SUBCUTANEOUS | 5 refills | Status: DC

## 2022-08-28 NOTE — Telephone Encounter (Signed)
Refill request received from [facility]   Kseimpta 20 mg Inject 20 mg into skin monthly

## 2022-09-20 ENCOUNTER — Other Ambulatory Visit: Payer: Self-pay | Admitting: Neurology

## 2022-10-21 ENCOUNTER — Telehealth: Payer: Self-pay | Admitting: Neurology

## 2022-10-21 NOTE — Telephone Encounter (Signed)
Resent

## 2022-10-21 NOTE — Telephone Encounter (Signed)
Pt called in and left a message. She stated she heard from [employer] and they never got the paperwork that was supposed to be faxed last month for her Kesimpta.

## 2022-10-27 ENCOUNTER — Telehealth: Payer: Self-pay

## 2022-10-27 ENCOUNTER — Encounter: Payer: Self-pay | Admitting: Neurology

## 2022-10-27 NOTE — Telephone Encounter (Signed)
Paperwork sent tot keismpta assistance with message patient due for her sot 10/28/22

## 2022-11-01 ENCOUNTER — Other Ambulatory Visit: Payer: Self-pay | Admitting: Neurology

## 2022-11-05 ENCOUNTER — Other Ambulatory Visit: Payer: Self-pay | Admitting: Neurology

## 2023-01-16 NOTE — Progress Notes (Unsigned)
NEUROLOGY FOLLOW UP OFFICE NOTE  Crystal Simon [id number]  Assessment/Plan:   Multiple sclersosis Tension-type headache, increased Ocular migraine   DMT:  Kesimpta.  Headache prevention: nortriptyline to 50mg  at bedtime *** Headache rescue:  Tizanidine 4mg  PRN.  May use ibuprofen but limit to no more than 2 days out of week to prevent rebound headache. *** D3 5,000 IU daily Check quantitative immunoglobulin panel, vit D, CBC w/diff, CMP today and in 6 months Follow up in 6 months.  Subjective:  Crystal Simon is a 57 year old right-handed Caucasian woman who follows up for multiple sclerosis.  MRI of brain from March 2022 personally reviewed.   UPDATE: Current disease modifying therapy:  Kesimpta (started March 2021) Other medications: Nortriptyline 50mg  QHS, tizanidine 4mg  PRN, ibuprofen, Lexapro 10mg ; D3 5,000 IU daily  07/18/2022 LABS:  CBC with WBC 5.3, HGB 13.8, HCT 39.9, PLT 296, ALC 1,574 cells/uL; CMP with Na 138, K 4.5, CL 104, CO2 28, glucose 82, BUN 13, C4 1.04, t bili 1.1, ALP 108, AST 12, ALT 15; Vit D 55; IgA 210, IgG 1,040, IgM 94.     Vision: Recently had an episode of   She has had it before. Motor: sometimes feels weak in legs. Sensory: Chronic sensory deficits..  Tongue is numb, which sometimes causes slurred speech.  She has tingling involving her face. Pain: muscle spasms and aching in the legs.  She sometimes notes an intermittent pain along the right side of her torso just under the breast and radiating to the back.  It feels like a spasm and causes shortness of breath.  Sometimes it radiates up the right side of her neck and into the jaw.   Headaches:  Increased nortriptyline to 50mg  last visit.   *** Gait: Chronic disability.  Uses a cane.  Unchanged. Bowel/Bladder: Irritable bowel syndrome. Fatigue: OK.  Not needed.   Cognition: No issues Mood: No issues Tolerates heat better.      HISTORY: Initial symptom in 2012 presented with 1 week episode of  vertigo with feeling off-balance.  This was followed by intermittent numbness and tingling of her hands and feet.  In November 2014, she developed double vision associated with slurred speech, right sided numbness and difficulty swallowing (the right side of her mouth, tongue and throat felt numb).  MRI of the brain with and without contrast from 07/12/13 was personally reviewed and showed hyperintense T2 lesions in the on left side of the midbrain.  MRI of cervical spine from 08/02/13 was personally reviewed and was negative for demyelinating lesions.  She underwent a lumbar puncture in January 2015.  CSF cell count was 1, protein 33, glucose 56, 3 oligoclonal bands (not present in serum), negative myelin basic protein, IgG index 0.52.  NMO antibody was negative.   Past disease modifying medication:  Tecfidera (effective but caused significant GERD), Gilenya (GI problems, transaminitis)   Other past medications:  gabapentin (fatigued), steroids (elevated blood pressure), Provigil 100mg  (effective)   At baseline, she had paresthesias of the right side of her face and throat.  She also has paresthesias in the upper extremities and toes, worse on the right.  Sometimes she needs to use a cane. Heat (such as during the summer months), stress and lack of sleep exacerbate chronic symptoms.  She also has restless leg syndrome.   In January 2019, she had an elevatedt bili 1.3, AST of 52 and ALT of 69.  It may have been due to gallstones rather than Gilenya, for which she  underwent surgery.  Over the next several months, her LFTs have been monitored and have steadily trended down.  Overall, she continues to have mild transaminitis which may be secondary to Gilenya.   In February 2019, she reported some headaches, a shooting pain radiating from the back of the head to the eye, usually right sided but also left sided as well.  It lasts only a few seconds and occurs two to three times a week.  She reported some neck  pain.  She also endorsed weakness and postural tremor in the hands.  Due to these new symptoms, she underwent repeat MRI of brain and cervical spine with and without contrast on 11/11/17, which was personally reviewed.  The brain showed stable and unchanged punctate focus of abnormal contrast enhancement in the left paramedian midbrain which was seen back in August.  It is not seen on other sequences.  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   Tension type Headaches.  Moderate right-mid occipital pressure by end of day, daily.  Photophobia when severe.  No nausea or phonophobia.  They are severe 2 times a month.   Ocular migraine:  visual disturbance, like looking through a tunnel that expanded until she couldn't see, lasted a 5-6 minutes.  Associated headache.   Imaging: 07/12/2013 MRI BRAIN W WO:  hyperintense T2 lesions in the on left side of the midbrain.   08/02/2013 MRI CERVICAL SPINE W WO:  negative for demyelinating lesions 01/18/2014 MRI BRAIN W WO: reportedly normal with resolution of the previous midbrain lesion (report available but not images).   10/09/2015 MRI BRAIN W WO: reportedly again unremarkable and showed no significant white matter disease.  Known 6 mm pineal cyst was noted.   04/14/2017 MRI BRAIN W WO:  punctate focus of enhancement in the location of previous left midbrain lesion, of uncertain etiology and significance.  Pineal cyst measures 7 mm. 04/11/2019 MRI BRAIN W WO:  stable, again noted unchanged punctate focus of enhancement in the midbrain without associated T2 signal abnormality.  Incidental 8 mm pineal cyst unchanged.  Otherwise, brain unremarkable. 01/01/2020 MRI BRAIN W WO:  Now visible within the cerebral hemispheric white matter are numerous small and punctate foci of T2 and FLAIR signal within the deep and subcortical white matter, approximately 8-10 on each side.  The differential diagnosis is that  of a manifestation of demyelinating disease versus interval expression of small-vessel disease. Majority of these were not present on the previous examinations. None show restricted diffusion or contrast enhancement.  Continued punctate focus of contrast enhancement in the left midbrain as seen previously, not felt to be clinically relevant. 12/01/2020 MRI BRAIN W WO:  Unchanged multifocal white matter hyperintensity consistent with multiple sclerosis. No new or acute intracranial abnormality. 08/07/2021 MRI C-SPINE W WO:  "1. Moderate to severe left-sided degenerative foraminal stenosis at C4-5.   2. Left paracentral disc/osteophyte complex at C5-6 with left ventral cord indentation, left lateral recess narrowing, and severe stenosis of the left foramen.  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  No active enhancement at the site at this time.  Minimal T2/FLAIR hyperintense foci with the white matter, nonspecific but could  be seen with small vessel ischemic changes, migraine headaches, or sequela of demyelinating disease.  Other less likely causes of gliosis are possible.  No actively demyelinating lesions are evident. 07/10/2022 MRI C-SPINE W WO:  No abnormal spinal cord lesions or enhancing spinal cord lesions.  Cervical spondylosis is similar to prior, to include left-sided foraminal stenosis at C4-C5 and C5-C6.  Modic 1 endplate changes are redemonstrated at C5-C6.  PAST MEDICAL HISTORY: Past Medical History:  Diagnosis Date   Anxiety    Arthritis    Bulging discs 1998   cervical   Complication of anesthesia    Constipation    Depression    Dysrhythmia    Floppy mitral valve    dx 30  yrs ago   GERD (gastroesophageal reflux disease)    Gilbert syndrome since late teens   Headache(784.0)    Heart palpitations    Hemorrhoids    Hypertension    MS (multiple sclerosis) (HCC)    PONV (postoperative nausea and vomiting)    last couple surgeries, she has done ok    MEDICATIONS: Current Outpatient Medications on File Prior to Visit  Medication Sig Dispense Refill   ALPRAZolam (XANAX) 1 MG tablet TAKE 1 TABLET BY MOUTH 3 TIMES DAILY AS NEEDED FOR ANXIETY. 30 tablet 3   Biotin 5000 MCG TABS Take 1 tablet by mouth daily.      Cholecalciferol (VITAMIN D-3) 5000 units TABS Take 1 tablet by mouth daily. 7000 mg day     Dexlansoprazole (DEXILANT PO) Take by mouth.     escitalopram (LEXAPRO) 20 MG tablet TAKE 1 TABLET BY MOUTH EVERY DAY 90 tablet 2   Magnesium 250 MG TABS Take by mouth.     Melatonin 10 MG TABS Take 1 tablet by mouth daily.      metoprolol succinate (TOPROL-XL) 25 MG 24 hr tablet TAKE 1 TABLET BY MOUTH TWICE A DAY 180 tablet 1   modafinil (PROVIGIL) 100 MG tablet TAKE 1 TABLET BY MOUTH ONCE (1) DAILY 30 tablet 0   nebivolol (BYSTOLIC) 5 MG tablet Take 5 mg by mouth daily.     nortriptyline (PAMELOR) 50 MG capsule TAKE 1 CAPSULE BY MOUTH AT BEDTIME. 90 capsule 2   Ofatumumab (KESIMPTA) 20 MG/0.4ML SOAJ Inject 20 mg into the skin every 30 (thirty) days. 1 mL 5   Omega-3 Fatty Acids (FISH OIL) 1000 MG CAPS Take 1 capsule by mouth daily.      omeprazole (PRILOSEC) 40 MG capsule TAKE 1 CAPSULE BY MOUTH TWICE A DAY BEFORE A MEAL 180 capsule 2   tiZANidine (ZANAFLEX) 4 MG tablet TAKE 1 TABLET BY MOUTH EVERY 6 HOURS AS NEEDED FOR MUSCLE SPASMS. 30 tablet 1   valACYclovir (VALTREX) 500 MG tablet TAKE 1 TABLET BY MOUTH TWICE A DAY FOR FEVER BLISTERS FOR 5 DAYS 30 tablet 2   No current facility-administered medications on file prior to visit.    ALLERGIES: Allergies  Allergen Reactions   Morphine Nausea And Vomiting   Oxycodone Other (See Comments) and Nausea And  Vomiting   Latex Rash    FAMILY HISTORY: Family History  Problem Relation Age of Onset   Heart disease Father    Colonic polyp Father    Hypertension Mother    Diabetes Mother    Skin cancer Mother    Alzheimer's disease Maternal Grandmother    Diabetes Maternal Grandmother    Hypertension Maternal Grandmother    Colon cancer Neg Hx       Objective:  ***  General: No acute distress.  Patient appears well-groomed.   Head:  Normocephalic/atraumatic Eyes:  Fundi examined but not visualized Neck: supple, no paraspinal tenderness, full range of motion Heart:  Regular rate and rhythm Neurological Exam: alert and oriented.  Speech fluent and not dysarthric.  Language intact.  Altered right V2-V3 sensation.  Otherwise, CN II-XII intact.  Bulk and tone normal.  Muscle strength 5/5 throughout.  Sensation to pinprick reduced in toes.  Vibratory sensation intact.  Deep tendon reflexes 3+ throughout.  Toes downgoing.  Finger to nose testing intact without ataxia/dysmetria.  Ambulates with limp.  Romberg positive.    Shon Millet, DO  CC: Marcelyn Ditty, DO

## 2023-01-19 ENCOUNTER — Other Ambulatory Visit (INDEPENDENT_AMBULATORY_CARE_PROVIDER_SITE_OTHER): Payer: Medicare HMO

## 2023-01-19 ENCOUNTER — Encounter: Payer: Self-pay | Admitting: Neurology

## 2023-01-19 ENCOUNTER — Ambulatory Visit: Payer: Medicare HMO | Admitting: Neurology

## 2023-01-19 VITALS — BP 150/100 | HR 97 | Ht 69.0 in | Wt 226.0 lb

## 2023-01-19 DIAGNOSIS — G35 Multiple sclerosis: Secondary | ICD-10-CM

## 2023-01-19 LAB — COMPREHENSIVE METABOLIC PANEL
ALT: 23 U/L (ref 0–35)
AST: 20 U/L (ref 0–37)
Albumin: 4.4 g/dL (ref 3.5–5.2)
Alkaline Phosphatase: 120 U/L — ABNORMAL HIGH (ref 39–117)
BUN: 13 mg/dL (ref 6–23)
CO2: 27 mEq/L (ref 19–32)
Calcium: 9.9 mg/dL (ref 8.4–10.5)
Chloride: 104 mEq/L (ref 96–112)
Creatinine, Ser: 1 mg/dL (ref 0.40–1.20)
GFR: 62.17 mL/min (ref >60.00)
Glucose, Bld: 83 mg/dL (ref 70–99)
Potassium: 4.3 mEq/L (ref 3.5–5.1)
Sodium: 140 mEq/L (ref 135–145)
Total Bilirubin: 1.1 mg/dL (ref 0.2–1.2)
Total Protein: 7.7 g/dL (ref 6.0–8.3)

## 2023-01-19 LAB — VITAMIN D 25 HYDROXY (VIT D DEFICIENCY, FRACTURES): VITD: 55.28 ng/mL (ref 30.00–100.00)

## 2023-01-19 MED ORDER — KESIMPTA 20 MG/0.4ML ~~LOC~~ SOAJ: 20.0000 mg | SUBCUTANEOUS | 5 refills | Status: AC

## 2023-01-19 MED ORDER — NORTRIPTYLINE HCL 50 MG PO CAPS: 50.0000 mg | ORAL_CAPSULE | Freq: Every day | ORAL | 1 refills | Status: DC

## 2023-01-19 MED ORDER — TIZANIDINE HCL 4 MG PO TABS: 4.0000 mg | ORAL_TABLET | Freq: Four times a day (QID) | ORAL | 5 refills | Status: DC | PRN

## 2023-01-19 MED ORDER — MODAFINIL 100 MG PO TABS: ORAL_TABLET | ORAL | 5 refills | Status: DC

## 2023-01-19 NOTE — Patient Instructions (Addendum)
Continue Kesimpta, nortriptyline 50mg  at bedtime, tizanidine 4mg  as needed, modafinil, D3 5000 IU daily Check immunoglobulin panel, CBC with diff, CMP and vit D today and in 6 months Follow up 6 months.

## 2023-01-20 ENCOUNTER — Encounter: Payer: Self-pay | Admitting: Neurology

## 2023-01-20 LAB — CBC WITH DIFFERENTIAL
Basophils Absolute: 0.1 10*3/uL (ref 0.0–0.2)
Basos: 1 %
EOS (ABSOLUTE): 0.1 10*3/uL (ref 0.0–0.4)
Eos: 3 %
Hematocrit: 42.1 % (ref 34.0–46.6)
Hemoglobin: 14.6 g/dL (ref 11.1–15.9)
Immature Grans (Abs): 0 10*3/uL (ref 0.0–0.1)
Immature Granulocytes: 0 %
Lymphocytes Absolute: 1.7 10*3/uL (ref 0.7–3.1)
Lymphs: 33 %
MCH: 31.5 pg (ref 26.6–33.0)
MCHC: 34.7 g/dL (ref 31.5–35.7)
MCV: 91 fL (ref 79–97)
Monocytes Absolute: 0.4 10*3/uL (ref 0.1–0.9)
Monocytes: 8 %
Neutrophils Absolute: 2.9 10*3/uL (ref 1.4–7.0)
Neutrophils: 55 %
RBC: 4.64 x10E6/uL (ref 3.77–5.28)
RDW: 12.7 % (ref 11.7–15.4)
WBC: 5.3 10*3/uL (ref 3.4–10.8)

## 2023-01-21 LAB — IGG, IGA, IGM
IgG (Immunoglobin G), Serum: 1092 mg/dL (ref 600–1640)
IgM, Serum: 100 mg/dL (ref 50–300)
Immunoglobulin A: 219 mg/dL (ref 47–310)

## 2023-02-06 ENCOUNTER — Telehealth: Payer: Self-pay | Admitting: Pharmacy Technician

## 2023-02-06 ENCOUNTER — Other Ambulatory Visit (HOSPITAL_COMMUNITY): Payer: Self-pay

## 2023-02-06 NOTE — Telephone Encounter (Signed)
Patient Advocate Encounter  Received notification from [facility] that prior authorization for [facility] 100MG  is required.   PA submitted on 5.31.24 Key [id number] Status is pending

## 2023-02-11 NOTE — Telephone Encounter (Signed)
Patient Advocate Encounter  Prior Authorization for Modafinil 100MG  tablets has been approved through [place].    KeyPricilla Larsson  Effective: 02-07-2023 to 09-07-2023

## 2023-02-25 ENCOUNTER — Emergency Department (HOSPITAL_COMMUNITY): Payer: Medicare HMO

## 2023-02-25 ENCOUNTER — Other Ambulatory Visit: Payer: Self-pay

## 2023-02-25 ENCOUNTER — Emergency Department (HOSPITAL_COMMUNITY)
Admission: EM | Admit: 2023-02-25 | Discharge: 2023-02-25 | Disposition: A | Discharge status: Home / Self Care | Payer: Medicare HMO | Attending: Emergency Medicine | Admitting: Emergency Medicine

## 2023-02-25 DIAGNOSIS — Z79899 Other long term (current) drug therapy: Secondary | ICD-10-CM | POA: Diagnosis not present

## 2023-02-25 DIAGNOSIS — S93401A Sprain of unspecified ligament of right ankle, initial encounter: Secondary | ICD-10-CM | POA: Insufficient documentation

## 2023-02-25 DIAGNOSIS — Z91040 Latex allergy status: Secondary | ICD-10-CM | POA: Insufficient documentation

## 2023-02-25 DIAGNOSIS — W19XXXA Unspecified fall, initial encounter: Secondary | ICD-10-CM | POA: Diagnosis not present

## 2023-02-25 DIAGNOSIS — S99911A Unspecified injury of right ankle, initial encounter: Secondary | ICD-10-CM | POA: Diagnosis present

## 2023-02-25 MED ORDER — OXYCODONE-ACETAMINOPHEN 5-325 MG PO TABS: 1.0000 | ORAL_TABLET | Freq: Four times a day (QID) | ORAL | 0 refills | Status: DC | PRN

## 2023-02-25 NOTE — ED Provider Notes (Signed)
Crystal Simon EMERGENCY DEPARTMENT AT [facility] Provider Note   CSN: [id number] Arrival date & time: 02/25/23  1929     History Chief Complaint  Patient presents with   Marletta Lor    Crystal Simon is a 57 y.o. female.  Patient presents emergency department complaints of mechanical fall.  She reports that she is with her ankle she was walking towards a concert outdoors.  Reports pain with ambulation unable to bear weight.  Denies any significant bruising unable to move ankle without pain.  Denies pain anywhere else.  Denies any syncope that led to this episode.   Fall       Home Medications Prior to Admission medications   Medication Sig Start Date End Date Taking? Authorizing Provider  oxyCODONE-acetaminophen (PERCOCET/ROXICET) 5-325 MG tablet Take 1 tablet by mouth every 6 (six) hours as needed for severe pain. 02/25/23  Yes Smitty Knudsen, PA-C  ALPRAZolam Prudy Feeler) 1 MG tablet TAKE 1 TABLET BY MOUTH 3 TIMES DAILY AS NEEDED FOR ANXIETY. 02/27/21   Abigail Miyamoto, MD  atorvastatin (LIPITOR) 20 MG tablet Take 20 mg by mouth daily.    [provider]  Biotin 5000 MCG TABS Take 1 tablet by mouth daily.     [provider]  Cholecalciferol (VITAMIN D-3) 5000 units TABS Take 1 tablet by mouth daily. 7000 mg day    [provider]  Dexlansoprazole (DEXILANT PO) Take by mouth.    [provider]  escitalopram (LEXAPRO) 20 MG tablet TAKE 1 TABLET BY MOUTH EVERY DAY 02/06/21   Abigail Miyamoto, MD  Magnesium 250 MG TABS Take by mouth.    [provider]  Melatonin 10 MG TABS Take 1 tablet by mouth daily.     [provider]  metoprolol succinate (TOPROL-XL) 25 MG 24 hr tablet TAKE 1 TABLET BY MOUTH TWICE A DAY 01/21/21   Abigail Miyamoto, MD  modafinil (PROVIGIL) 100 MG tablet TAKE 1 TABLET BY MOUTH ONCE (1) DAILY 01/19/23   Everlena Cooper, Adam R, DO  nebivolol (BYSTOLIC) 5 MG tablet Take 5 mg by mouth daily.    [provider]  nortriptyline (PAMELOR) 50 MG capsule Take 1 capsule (50 mg total) by mouth at bedtime. 01/19/23   Everlena Cooper, Adam R, DO  Ofatumumab (KESIMPTA) 20 MG/0.4ML SOAJ Inject 20 mg into the skin every 30 (thirty) days. 01/19/23   Drema Dallas, DO  Omega-3 Fatty Acids (FISH OIL) 1000 MG CAPS Take 1 capsule by mouth daily.     [provider]  omeprazole (PRILOSEC) 40 MG capsule TAKE 1 CAPSULE BY MOUTH TWICE A DAY BEFORE A MEAL 05/29/21   Abigail Miyamoto, MD  tiZANidine (ZANAFLEX) 4 MG tablet Take 1 tablet (4 mg total) by mouth every 6 (six) hours as needed for muscle spasms. 01/19/23   Drema Dallas, DO  valACYclovir (VALTREX) 500 MG tablet TAKE 1 TABLET BY MOUTH TWICE A DAY FOR FEVER BLISTERS FOR 5 DAYS 12/11/20   Abigail Miyamoto, MD      Allergies    Morphine, Oxycodone, and Latex    Review of Systems   Review of Systems  Musculoskeletal:  Positive for joint swelling.  All other systems reviewed and are negative.   Physical Exam Updated Vital Signs BP (!) 172/100   Pulse 77   Temp 98.2 F (36.8 C) (Oral)   Resp 18   SpO2 100%  Physical Exam Vitals and nursing note reviewed.  Constitutional:  General: She is not in acute distress.    Appearance: She is well-developed.  HENT:     Head: Normocephalic and atraumatic.  Eyes:     Conjunctiva/sclera: Conjunctivae normal.  Cardiovascular:     Rate and Rhythm: Normal rate and regular rhythm.     Heart sounds: No murmur heard. Pulmonary:     Effort: Pulmonary effort is normal. No respiratory distress.     Breath sounds: Normal breath sounds.  Abdominal:     Palpations: Abdomen is soft.     Tenderness: There is no abdominal tenderness.  Musculoskeletal:        General: Swelling, tenderness and signs of injury present. No deformity.     Cervical back: Neck supple.     Comments: Right ankle is notably swollen compared to left.  Range of motion is limited due to pain.  Tenderness palpation along the  lateral and medial malleoli.  No evident ecchymosis.  Skin:    General: Skin is warm and dry.     Capillary Refill: Capillary refill takes less than 2 seconds.  Neurological:     Mental Status: She is alert.  Psychiatric:        Mood and Affect: Mood normal.     ED Results / Procedures / Treatments   Labs (all labs ordered are listed, but only abnormal results are displayed) Labs Reviewed - No data to display  EKG None  Radiology DG Ankle Complete Right  Result Date: 02/25/2023 CLINICAL DATA:  Fall and trauma to the right lower extremity. EXAM: RIGHT ANKLE - COMPLETE 3+ VIEW; RIGHT TIBIA AND FIBULA - 2 VIEW; RIGHT FOOT COMPLETE - 3+ VIEW COMPARISON:  None Available. FINDINGS: There is no acute fracture or dislocation. The bones are well mineralized. No significant arthritic changes. The soft tissues are unremarkable. IMPRESSION: Negative. Electronically Signed   By: Elgie Collard M.D.   On: 02/25/2023 21:08   DG Tibia/Fibula Right  Result Date: 02/25/2023 CLINICAL DATA:  Fall and trauma to the right lower extremity. EXAM: RIGHT ANKLE - COMPLETE 3+ VIEW; RIGHT TIBIA AND FIBULA - 2 VIEW; RIGHT FOOT COMPLETE - 3+ VIEW COMPARISON:  None Available. FINDINGS: There is no acute fracture or dislocation. The bones are well mineralized. No significant arthritic changes. The soft tissues are unremarkable. IMPRESSION: Negative. Electronically Signed   By: Elgie Collard M.D.   On: 02/25/2023 21:08   DG Foot Complete Right  Result Date: 02/25/2023 CLINICAL DATA:  Fall and trauma to the right lower extremity. EXAM: RIGHT ANKLE - COMPLETE 3+ VIEW; RIGHT TIBIA AND FIBULA - 2 VIEW; RIGHT FOOT COMPLETE - 3+ VIEW COMPARISON:  None Available. FINDINGS: There is no acute fracture or dislocation. The bones are well mineralized. No significant arthritic changes. The soft tissues are unremarkable. IMPRESSION: Negative. Electronically Signed   By: Elgie Collard M.D.   On: 02/25/2023 21:08     Procedures Procedures   Medications Ordered in ED Medications - No data to display  ED Course/ Medical Decision Making/ A&P Clinical Course as of 02/25/23 2314  Wed Feb 25, 2023  2131 DG Tibia/Fibula Right [OZ]    Clinical Course User Index [OZ] Smitty Knudsen, PA-C                           Medical Decision Making Amount and/or Complexity of Data Reviewed Radiology: ordered. Decision-making details documented in ED Course.   This patient presents to the ED for concern of  fall.  Differential diagnosis includes ankle dislocation, ankle fracture, laceration, cellulitis   Imaging Studies ordered:  I ordered imaging studies including x-ray of right ankle, right tibia/fibula, right foot I independently visualized and interpreted imaging which showed no obvious fractures dislocations I agree with the radiologist interpretation   Problem List / ED Course:  Patient presents to the emergency department following a mechanical fall.  Reports she was on her way to a concert when she lost her footing while she was walking on her heels.  States that she had notable swelling to the foot and is unable to bear weight.  Tenderness along the lateral medial malleoli.  Will evaluate with x-ray imaging for potential fracture or dislocation. X-ray imaging was negative for any fracture or dislocation.  Informed patient of these findings.  Will place patient into a cam boot.  Offered patient pain medication but she refused at this time as she reports that she does get some constipation with opiates.  Informed patient she should take over-the-counter pain medications at home over next few days.  Also provide a prescription for Percocet for more severe pain as needed.  Encourage present follow-up with primary care provider within the next week for further evaluation.  Did discuss the potential for an occult fracture that cannot be visualized on x-ray imaging to be required repeat imaging in the next few  days to week if symptoms or not improving.  Patient is agreeable to treatment plan and verbalized understanding all return precautions.  All questions answered prior to patient discharge.  Final Clinical Impression(s) / ED Diagnoses Final diagnoses:  Sprain of right ankle, unspecified ligament, initial encounter    Rx / DC Orders ED Discharge Orders          Ordered    oxyCODONE-acetaminophen (PERCOCET/ROXICET) 5-325 MG tablet  Every 6 hours PRN        02/25/23 2301              Smitty Knudsen, PA-C 02/25/23 2314    Mardene Sayer, MD 02/26/23 1137

## 2023-02-25 NOTE — Discharge Instructions (Addendum)
You were seen in the emergency department for a fall.  Your right ankle/leg is thankfully not obviously broken but there can be a potential occult fracture.  Most likely you have a sprain we will treat this by placing into a cam boot and provided you with crutches for additional support.  Please follow-up with your primary care provider for further evaluation and seek urgent care if you have any acute worsening of your symptoms.

## 2023-02-25 NOTE — ED Triage Notes (Signed)
EMS- report patient was her way to a concerns. Wearing heels and fell.  Vitals within normal

## 2023-07-17 ENCOUNTER — Other Ambulatory Visit (INDEPENDENT_AMBULATORY_CARE_PROVIDER_SITE_OTHER): Payer: Medicare HMO

## 2023-07-17 ENCOUNTER — Ambulatory Visit: Payer: Medicare HMO | Admitting: Neurology

## 2023-07-17 ENCOUNTER — Encounter: Payer: Self-pay | Admitting: Neurology

## 2023-07-17 VITALS — BP 128/80 | HR 84 | Ht 68.0 in | Wt 226.4 lb

## 2023-07-17 DIAGNOSIS — G44219 Episodic tension-type headache, not intractable: Secondary | ICD-10-CM

## 2023-07-17 DIAGNOSIS — G43109 Migraine with aura, not intractable, without status migrainosus: Secondary | ICD-10-CM

## 2023-07-17 DIAGNOSIS — G35 Multiple sclerosis: Secondary | ICD-10-CM | POA: Diagnosis not present

## 2023-07-17 LAB — COMPREHENSIVE METABOLIC PANEL
ALT: 21 U/L (ref 0–35)
AST: 17 U/L (ref 0–37)
Albumin: 4.6 g/dL (ref 3.5–5.2)
Alkaline Phosphatase: 108 U/L (ref 39–117)
BUN: 9 mg/dL (ref 6–23)
CO2: 28 meq/L (ref 19–32)
Calcium: 9.7 mg/dL (ref 8.4–10.5)
Chloride: 105 meq/L (ref 96–112)
Creatinine, Ser: 1.13 mg/dL (ref 0.40–1.20)
GFR: 53.5 mL/min — ABNORMAL LOW (ref >60.00)
Glucose, Bld: 90 mg/dL (ref 70–99)
Potassium: 4.3 meq/L (ref 3.5–5.1)
Sodium: 140 meq/L (ref 135–145)
Total Bilirubin: 1.2 mg/dL (ref 0.2–1.2)
Total Protein: 7.3 g/dL (ref 6.0–8.3)

## 2023-07-17 LAB — CBC WITH DIFFERENTIAL/PLATELET
Basophils Absolute: 0.1 10*3/uL (ref 0.0–0.1)
Basophils Relative: 1.1 % (ref 0.0–3.0)
Eosinophils Absolute: 0.2 10*3/uL (ref 0.0–0.7)
Eosinophils Relative: 2.6 % (ref 0.0–5.0)
HCT: 43.2 % (ref 36.0–46.0)
Hemoglobin: 14.5 g/dL (ref 12.0–15.0)
Lymphocytes Relative: 28.5 % (ref 12.0–46.0)
Lymphs Abs: 1.7 10*3/uL (ref 0.7–4.0)
MCHC: 33.5 g/dL (ref 30.0–36.0)
MCV: 93 fL (ref 78.0–100.0)
Monocytes Absolute: 0.4 10*3/uL (ref 0.1–1.0)
Monocytes Relative: 6.2 % (ref 3.0–12.0)
Neutro Abs: 3.7 10*3/uL (ref 1.4–7.7)
Neutrophils Relative %: 61.6 % (ref 43.0–77.0)
Platelets: 327 10*3/uL (ref 150.0–400.0)
RBC: 4.64 Mil/uL (ref 3.87–5.11)
RDW: 13.1 % (ref 11.5–15.5)
WBC: 6 10*3/uL (ref 4.0–10.5)

## 2023-07-17 LAB — VITAMIN D 25 HYDROXY (VIT D DEFICIENCY, FRACTURES): VITD: 35.74 ng/mL (ref 30.00–100.00)

## 2023-07-17 MED ORDER — TOPIRAMATE 50 MG PO TABS: ORAL_TABLET | ORAL | 0 refills | Status: DC

## 2023-07-17 MED ORDER — NORTRIPTYLINE HCL 25 MG PO CAPS: 25.0000 mg | ORAL_CAPSULE | Freq: Every day | ORAL | 3 refills | Status: DC

## 2023-07-17 NOTE — Progress Notes (Signed)
NEUROLOGY FOLLOW UP OFFICE NOTE  Crystal Simon [id number]  Assessment/Plan:   Multiple sclerosis Tension-type headache, increased Ocular migraine   DMT:  Kesimpta Headache prevention: Due to concerns of weight gain, will transition from nortriptyline to topiramate: Start topiramate 25mg  at bedtime for one week, then increase to 50mg  at bedtime Decrease nortriptyline to 25mg  at bedtime.  If still doing well in 3 months, may discontinue Headache rescue:  Tizanidine 4mg  PRN.  May use ibuprofen but limit to no more than 2 days out of week to prevent rebound headache.  Modafinil for excessive daytime fatigue. D3 5,000 IU daily Check quantitative immunoglobulin panel, vit D, CBC w/diff, CMP today and in 6 months Repeat MRI of brain and cervical spine with and without contrast Follow up in 6 months.   Subjective:  Crystal Simon is a 57 year old right-handed Caucasian woman who follows up for multiple sclerosis.     UPDATE: Current disease modifying therapy:  Kesimpta (started March 2021) Other medications: Nortriptyline 50mg  QHS, tizanidine 4mg  PRN, ibuprofen, Lexapro 10mg ; D3 5,000 IU daily  01/19/2023 LABS:  CBC with WBC 5.3, HGB 14.6, HCT 42.1, PLT ALC 1.7; vit D 55.28; IgA 219, IgG 1092, IgM 100; CMP with Na 140, K 4.3, Cl 104, CO2 27, glucose 83, BUN 13, Cr 1, t bili 1.1, ALP 120, AST 20, ALT 23   Vision: Stable Motor: Notes kinetic tremors in hands after activities with her hands (vacuuming, pulling weeds).  Lasts until she is able to rest for 30-40 minutes.     Sensory: Chronic sensory deficits Pain: muscle spasms and aching in the legs. Headaches:  nortriptyline to 50mg  last visit.   Doing well Gait: Chronic disability.  Uses a cane.  Unchanged. Bowel/Bladder: Irritable bowel syndrome. Fatigue: Stable.  Takes modafinil as needed. Improved after discontinuing statin. Cognition: No issues Mood: She does feel sad during the holidays.  She misses her husband who passed  away. She is concerned about her weight gain.        HISTORY: Initial symptom in 2012 presented with 1 week episode of vertigo with feeling off-balance.  This was followed by intermittent numbness and tingling of her hands and feet.  In November 2014, she developed double vision associated with slurred speech, right sided numbness and difficulty swallowing (the right side of her mouth, tongue and throat felt numb).  MRI of the brain with and without contrast from 07/12/13 was personally reviewed and showed hyperintense T2 lesions in the on left side of the midbrain.  MRI of cervical spine from 08/02/13 was personally reviewed and was negative for demyelinating lesions.  She underwent a lumbar puncture in January 2015.  CSF cell count was 1, protein 33, glucose 56, 3 oligoclonal bands (not present in serum), negative myelin basic protein, IgG index 0.52.  NMO antibody was negative.   Past disease modifying medication:  Tecfidera (effective but caused significant GERD), Gilenya (GI problems, transaminitis)   Other past medications:  gabapentin (fatigued), steroids (elevated blood pressure), Provigil 100mg  (effective)   At baseline, she had paresthesias of the right side of her face and throat.  She also has paresthesias in the upper extremities and toes, worse on the right.  Sometimes she needs to use a cane. Heat (such as during the summer months), stress and lack of sleep exacerbate chronic symptoms.  She also has restless leg syndrome.   In January 2019, she had an elevatedt bili 1.3, AST of 52 and ALT of 69.  It may have been  due to gallstones rather than Gilenya, for which she underwent surgery.  Over the next several months, her LFTs have been monitored and have steadily trended down.  Overall, she continues to have mild transaminitis which may be secondary to Gilenya.   In February 2019, she reported some headaches, a shooting pain radiating from the back of the head to the eye, usually right  sided but also left sided as well.  It lasts only a few seconds and occurs two to three times a week.  She reported some neck pain.  She also endorsed weakness and postural tremor in the hands.  Due to these new symptoms, she underwent repeat MRI of brain and cervical spine with and without contrast on 11/11/17, which was personally reviewed.  The brain showed stable and unchanged punctate focus of abnormal contrast enhancement in the left paramedian midbrain which was seen back in August.  It is not seen on other sequences.  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   Tension type Headaches.  Moderate right-mid occipital pressure by end of day, daily.  Photophobia when severe.  No nausea or phonophobia.  They are severe 2 times a month.   Ocular migraine:  visual disturbance, like looking through a tunnel that expanded until she couldn't see, lasted a 5-6 minutes.  Associated headache.   Imaging: 07/12/2013 MRI BRAIN W WO:  hyperintense T2 lesions in the on left side of the midbrain.   08/02/2013 MRI CERVICAL SPINE W WO:  negative for demyelinating lesions 01/18/2014 MRI BRAIN W WO: reportedly normal with resolution of the previous midbrain lesion (report available but not images).   10/09/2015 MRI BRAIN W WO: reportedly again unremarkable and showed no significant white matter disease.  Known 6 mm pineal cyst was noted.   04/14/2017 MRI BRAIN W WO:  punctate focus of enhancement in the location of previous left midbrain lesion, of uncertain etiology and significance.  Pineal cyst measures 7 mm. 04/11/2019 MRI BRAIN W WO:  stable, again noted unchanged punctate focus of enhancement in the midbrain without associated T2 signal abnormality.  Incidental 8 mm pineal cyst unchanged.  Otherwise, brain unremarkable. 01/01/2020 MRI BRAIN W WO:  Now visible within the cerebral hemispheric white matter are numerous small and punctate foci of T2 and  FLAIR signal within the deep and subcortical white matter, approximately 8-10 on each side.  The differential diagnosis is that of a manifestation of demyelinating disease versus interval expression of small-vessel disease. Majority of these were not present on the previous examinations. None show restricted diffusion or contrast enhancement.  Continued punctate focus of contrast enhancement in the left midbrain as seen previously, not felt to be clinically relevant. 12/01/2020 MRI BRAIN W WO:  Unchanged multifocal white matter hyperintensity consistent with multiple sclerosis. No new or acute intracranial abnormality. 08/07/2021 MRI C-SPINE W WO:  "1. Moderate to severe left-sided degenerative foraminal stenosis at C4-5.   2. Left paracentral disc/osteophyte complex at C5-6 with left ventral cord indentation, left lateral recess narrowing, and severe stenosis of the left foramen.  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  No active enhancement at the site at this time.  Minimal T2/FLAIR  hyperintense foci with the white matter, nonspecific but could be seen with small vessel ischemic changes, migraine headaches, or sequela of demyelinating disease.  Other less likely causes of gliosis are possible.  No actively demyelinating lesions are evident. 07/10/2022 MRI C-SPINE W WO:  No abnormal spinal cord lesions or enhancing spinal cord lesions.  Cervical spondylosis is similar to prior, to include left-sided foraminal stenosis at C4-C5 and C5-C6.  Modic 1 endplate changes are redemonstrated at C5-C6.  PAST MEDICAL HISTORY: Past Medical History:  Diagnosis Date   Anxiety    Arthritis    Bulging discs  1998   cervical   Complication of anesthesia    Constipation    Depression    Dysrhythmia    Floppy mitral valve    dx 30 yrs ago   GERD (gastroesophageal reflux disease)    Gilbert syndrome since late teens   Headache(784.0)    Heart palpitations    Hemorrhoids    Hypertension    MS (multiple sclerosis) (HCC)    PONV (postoperative nausea and vomiting)    last couple surgeries, she has done ok    MEDICATIONS: Current Outpatient Medications on File Prior to Visit  Medication Sig Dispense Refill   ALPRAZolam (XANAX) 1 MG tablet TAKE 1 TABLET BY MOUTH 3 TIMES DAILY AS NEEDED FOR ANXIETY. 30 tablet 3   atorvastatin (LIPITOR) 20 MG tablet Take 20 mg by mouth daily.     Biotin 5000 MCG TABS Take 1 tablet by mouth daily.      Cholecalciferol (VITAMIN D-3) 5000 units TABS Take 1 tablet by mouth daily. 7000 mg day     Dexlansoprazole (DEXILANT PO) Take by mouth.     escitalopram (LEXAPRO) 20 MG tablet TAKE 1 TABLET BY MOUTH EVERY DAY 90 tablet 2   Magnesium 250 MG TABS Take by mouth.     Melatonin 10 MG TABS Take 1 tablet by mouth daily.      metoprolol succinate (TOPROL-XL) 25 MG 24 hr tablet TAKE 1 TABLET BY MOUTH TWICE A DAY 180 tablet 1   modafinil (PROVIGIL) 100 MG tablet TAKE 1 TABLET BY MOUTH ONCE (1) DAILY 30 tablet 5   nebivolol (BYSTOLIC) 5 MG tablet Take 5 mg by mouth daily.     nortriptyline (PAMELOR) 50 MG capsule Take 1 capsule (50 mg total) by mouth at bedtime. 90 capsule 1   Ofatumumab (KESIMPTA) 20 MG/0.4ML SOAJ Inject 20 mg into the skin every 30 (thirty) days. 1 mL 5   Omega-3 Fatty Acids (FISH OIL) 1000 MG CAPS Take 1 capsule by mouth daily.      omeprazole (PRILOSEC) 40 MG capsule TAKE 1 CAPSULE BY MOUTH TWICE A DAY BEFORE A MEAL 180 capsule 2   oxyCODONE-acetaminophen (PERCOCET/ROXICET) 5-325 MG tablet Take 1 tablet by mouth every 6 (six) hours as needed for severe pain. 15 tablet 0   tiZANidine (ZANAFLEX) 4 MG tablet Take 1 tablet (4 mg total) by mouth every 6  (six) hours as needed for muscle spasms. 30 tablet 5   valACYclovir (VALTREX) 500 MG tablet TAKE 1 TABLET BY MOUTH TWICE A DAY FOR FEVER BLISTERS FOR 5 DAYS 30 tablet 2   No current facility-administered medications on file prior to visit.    ALLERGIES: Allergies  Allergen Reactions   Morphine Nausea And Vomiting   Oxycodone Other (See Comments) and Nausea And Vomiting   Latex Rash    FAMILY HISTORY: Family History  Problem Relation Age of Onset   Heart disease Father  Colonic polyp Father    Hypertension Mother    Diabetes Mother    Skin cancer Mother    Alzheimer's disease Maternal Grandmother    Diabetes Maternal Grandmother    Hypertension Maternal Grandmother    Colon cancer Neg Hx       Objective:  Blood pressure 128/80, pulse 84, height 5\' 8"  (1.727 m), weight 226 lb 6.4 oz (102.7 kg), SpO2 99%. General: No acute distress.  Patient appears well-groomed.   Head:  Normocephalic/atraumatic Eyes:  Fundi examined but not visualized Neck: supple, no paraspinal tenderness, full range of motion Heart:  Regular rate and rhythm Neurological Exam: alert and oriented.  Speech fluent and not dysarthric.  Language intact.  Altered right V2-V3 sensation.  Otherwise, CN II-XII intact.  Bulk and tone normal.  Muscle strength 5/5 throughout.  Slight postural tremor in hands, particularly left.  Sensation to pinprick reduced in right greater than left toes.  Vibratory sensation reduced in right foot.  aDeep tendon reflexes 3+ throughout.  Toes downgoing.  Finger to nose testing intact without ataxia/dysmetria.  Ambulates with limp.  Romberg positive.  Shon Millet, DO  CC: Marcelyn Ditty, DO

## 2023-07-17 NOTE — Patient Instructions (Addendum)
Decrease nortriptyline to 25mg  at bedtime.  If headaches remain stable in 3 months (February), may discontinue it. Start topiramate 50mg  - take 1/2 tablet at bedtime for one week, then increase to 1 tablet at bedtime Continue Kesimpta Continue D3 5000 I U daily Check MRI of brain and cervical spine with and without contrast. We have sent a referral to [facility] for your MRI and they will call you directly to schedule your appointment. They are located at [address] [facility]. If you need to contact them directly please call [phone].  Check labs:  CBC with diff, CMP, immunoglobulin panel, vit D.Your provider has requested that you have labwork completed today. Please go to [facility] ([id number]) on the second floor of this building before leaving the office today. You do not need to check in. If you are not called within 15 minutes please check with the front desk.   Follow up 6 months.

## 2023-07-18 LAB — IGG, IGA, IGM
IgG (Immunoglobin G), Serum: 1022 mg/dL (ref 600–1640)
IgM, Serum: 97 mg/dL (ref 50–300)
Immunoglobulin A: 217 mg/dL (ref 47–310)

## 2023-07-20 NOTE — Progress Notes (Signed)
Patient advised.

## 2023-07-24 ENCOUNTER — Other Ambulatory Visit: Payer: Self-pay | Admitting: Neurology

## 2023-07-27 ENCOUNTER — Ambulatory Visit: Payer: Medicare HMO | Admitting: Neurology

## 2023-07-29 ENCOUNTER — Encounter: Payer: Self-pay | Admitting: Neurology

## 2023-08-02 ENCOUNTER — Other Ambulatory Visit: Payer: Self-pay | Admitting: Neurology

## 2023-08-04 ENCOUNTER — Other Ambulatory Visit: Payer: Medicare HMO

## 2023-08-14 ENCOUNTER — Other Ambulatory Visit: Payer: Self-pay | Admitting: Neurology

## 2023-11-03 ENCOUNTER — Other Ambulatory Visit: Payer: Medicare HMO

## 2023-11-06 ENCOUNTER — Telehealth: Payer: Self-pay | Admitting: Neurology

## 2023-11-06 NOTE — Telephone Encounter (Signed)
 Pt called in and left a message. She stated she took her shot out of the fridge a few days ago due to it hurting when she takes it really cold. She forgot she took it out and wants to see if it's ok for her to still take the shot even if it hasn't been refrigerated for a few days?

## 2023-11-06 NOTE — Telephone Encounter (Signed)
 Per the [employer], [facility] must be refrigerated at 2 C to 8 C (36 F to 53 F). Keep in original carton to protect from light until use. If necessary, they may be stored at room temperature below 30 C (86 F) for up to 7 days and returned to refrigerator, to be used within 7 days. If not used within those 7 days, discard the medicine.   LMOVM / Mychart message as well.

## 2023-11-24 ENCOUNTER — Telehealth: Payer: Self-pay | Admitting: Neurology

## 2023-11-24 NOTE — Telephone Encounter (Signed)
 Pt's Kesimpta needs authorization before the pharmacy will fill the medication

## 2023-11-25 NOTE — Telephone Encounter (Signed)
 Pt. Needs to have PA done for RX

## 2023-11-26 ENCOUNTER — Telehealth: Payer: Self-pay | Admitting: Neurology

## 2023-11-26 NOTE — Telephone Encounter (Signed)
 spoke with [place] and her case is closed no pin injection was given, she needs help on how she can get her medication. PA is working on it but this was new info provided

## 2023-11-26 NOTE — Telephone Encounter (Signed)
 Pt called back in after getting Sheena's message. She cannot come get samples so she will just be late taking the medicine

## 2023-12-02 ENCOUNTER — Other Ambulatory Visit (HOSPITAL_COMMUNITY): Payer: Self-pay

## 2023-12-02 ENCOUNTER — Telehealth: Payer: Self-pay | Admitting: Pharmacy Technician

## 2023-12-02 NOTE — Telephone Encounter (Signed)
 KEY: [id number]   PA is not needed for this medication. Patient copay is [phone]. Patient is likely to benefit from the [facility] Prescription Payment Plan, however patient was previously receiving Kesimpta through [employer] Patient assistance.

## 2023-12-02 NOTE — Telephone Encounter (Signed)
 Pharmacy Patient Advocate Encounter   Received notification from Patient Advice Request messages that prior authorization for [facility] 20MG  is required/requested.   Insurance verification completed.   The patient is insured through [employer] .   Per test claim: PA required and submitted KEY/EOC/Request #: [id number] CANCELLED due to

## 2023-12-03 DIAGNOSIS — G35 Multiple sclerosis: Secondary | ICD-10-CM

## 2023-12-03 NOTE — Telephone Encounter (Signed)
 LMOVM for patient as well a mychart message, Please fill out the patient portion of the Keismpta start form.   I will have DR.Everlena Cooper fill out the office portion and fax it over to [employer].

## 2023-12-04 ENCOUNTER — Other Ambulatory Visit: Payer: Medicare HMO

## 2023-12-11 ENCOUNTER — Telehealth: Payer: Self-pay

## 2023-12-11 NOTE — Telephone Encounter (Signed)
 Per patient, Hello Crystal Simon. Doctor Everlena Cooper wanted me to have my contrast MRI's after my last visit and everytime I scheduled my appointment I either had the flu or covid and the last time I was told to get another referral from my Doctor. My next appointment with Dr. Everlena Cooper is May 12th. I would like to schedule it 2 weeks before I come in for my check-up. I would like to have it done in [place] at [place] [facility]. Would you please send that referral over so I can schedule that appointment? Thank you so very much for your time.    Kind regards,  Crystal Simon  17-Aug-1966

## 2023-12-28 NOTE — Telephone Encounter (Signed)
 Pt called in wanting to make sure Dr. Festus Hubert was aware it has been 2 months since she has taken her medication. She says she is having withdrawals or relapsing. She has been having body aches, headache, and nausea.

## 2023-12-31 ENCOUNTER — Telehealth: Payer: Self-pay

## 2023-12-31 NOTE — Telephone Encounter (Signed)
 Spoke to Kesimpta , Patient information was transferred to [employer].  Patient to fill out the form and send her information to re enroll for Assistance.  Patient advised to copy her Medicare card to a mychart message so we can do a PA for Kesimpta  through it and send what we can to the Assistance program.  Patient is to also call Medicare to see if she could be eligible to help through them as well.

## 2024-01-01 ENCOUNTER — Other Ambulatory Visit (HOSPITAL_COMMUNITY): Payer: Self-pay

## 2024-01-04 ENCOUNTER — Telehealth: Payer: Self-pay | Admitting: Pharmacy Technician

## 2024-01-04 NOTE — Telephone Encounter (Signed)
 PA is already approved for Kesimpta . See telephone encounter 4.28.25.

## 2024-01-04 NOTE — Telephone Encounter (Signed)
 Pharmacy Patient Advocate Encounter  Received notification from [employer] that Prior Authorization for KESIMPTA  20MG  has been APPROVED from 3.26.25 to 12.31.25   PA #/Case ID/Reference #:

## 2024-01-04 NOTE — Telephone Encounter (Signed)
 Pt is eligible for the Medicare Payment Plan. She would have to call her insurance to set it up. They would see how much of the $2000 deductible she has met so far for the year, then divide the rest of it up evenly over the rest of the year as monthly payment plans. Once she meets the $2000 deductible all other medications from the pharmacy would be $0.

## 2024-01-04 NOTE — Telephone Encounter (Signed)
 Pharmacy Patient Advocate Encounter   Received notification from Pt Calls Messages that prior authorization for KESIMPTA  20MG  is required/requested.   Insurance verification completed.   The patient is insured through [employer] .   Per test claim: PA required; PA submitted to above mentioned insurance via CoverMyMeds Key/confirmation #/EOC [facility] Status is pending

## 2024-01-18 ENCOUNTER — Ambulatory Visit: Payer: Medicare HMO | Admitting: Neurology

## 2024-01-20 ENCOUNTER — Ambulatory Visit
Admission: RE | Admit: 2024-01-20 | Discharge: 2024-01-20 | Disposition: A | Discharge status: Home / Self Care | Source: Ambulatory Visit | Attending: Neurology | Admitting: Neurology

## 2024-01-20 DIAGNOSIS — G35 Multiple sclerosis: Secondary | ICD-10-CM

## 2024-01-20 DIAGNOSIS — G44219 Episodic tension-type headache, not intractable: Secondary | ICD-10-CM

## 2024-01-20 DIAGNOSIS — G35D Multiple sclerosis, unspecified: Secondary | ICD-10-CM

## 2024-01-20 DIAGNOSIS — G43109 Migraine with aura, not intractable, without status migrainosus: Secondary | ICD-10-CM

## 2024-01-20 MED ORDER — GADOPICLENOL 0.5 MMOL/ML IV SOLN
10.0000 mL | Freq: Once | INTRAVENOUS | Status: AC | PRN
  Administered 2024-01-20: 10 mL via INTRAVENOUS

## 2024-01-28 ENCOUNTER — Ambulatory Visit: Payer: Self-pay | Admitting: Neurology

## 2024-02-05 ENCOUNTER — Other Ambulatory Visit

## 2024-02-08 NOTE — Progress Notes (Signed)
 NEUROLOGY FOLLOW UP OFFICE NOTE  Crystal Simon [id number]  Assessment/Plan:   Multiple sclerosis Migraine without aura, without status migrainosus, not intractable Ocular migraine   DMT:  Kesimpta  Headache prevention: Suspect memory deficits related to topiramate .  Plan to start Emgality and discontinue topiramate  (continue topiramate  until she can start the new preventative) Headache rescue:  Tizanidine  4mg  PRN.  May use ibuprofen  but limit to no more than 2 days out of week to prevent rebound headache.  D3 5,000 IU daily Check quantitative immunoglobulin panel, vit D, CBC w/diff, CMP in 6 months Follow up in 6 months.   Subjective:  Crystal Simon is a 58 year old right-handed Caucasian woman who follows up for multiple sclerosis.    MRIs personally reviewed.   UPDATE: Current disease modifying therapy:  Kesimpta  (started March 2021) Other medications: topiramate  50mg  every afternoon (caused hyperactivity and vivid dreams at night), tizanidine  4mg  PRN, ibuprofen , metoprolol, Lexapro  10mg ; D3 5,000 IU daily   01/20/2024 MRI BRAIN W WO:  Similar nonspecific mild white matter changes which do not have the typical appearance for demyelinating disease. Differential could include early chronic microvascular ischemia, vasculitis, or atypical demyelinating disease. There is no evidence for active demyelinating disease. 01/20/2024 MRI C-SPINE W WO:  1. No evidence of cervical spinal cord demyelination. 2. 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 No spinal stenosis but moderate to severe chronic left C5 and C6 neural foraminal stenosis.   Insurance has now denied approval of Kesimpta .  However, she is able to get it through the financial assistance program.    Changed from nortriptyline  to topiramate .  Has lost weight.    Vision: Stable Motor: Notes kinetic tremors in hands after activities with her hands (vacuuming,  pulling weeds).  Lasts until she is able to rest for 30-40 minutes.     Sensory: Chronic sensory deficits, sometimes right side of face and neck Pain: muscle spasms and aching in the legs and chest.  Gait: Chronic disability.  Uses a cane.  Unchanged. Bowel/Bladder: Irritable bowel syndrome. Fatigue: Stable.  Takes modafinil  as needed. Improved after discontinuing statin. Cognition: In January and February, started having brain fog.  One time, she put her dog outside and forgot to let her back in.  She got her house and car insurance mixed up.  Mood: She does feel sad during the holidays.  Migraines:  two to four migraine days a month Tension-type headache:  mild, daily She always feels nauseous      HISTORY: Initial symptom in 2012 presented with 1 week episode of vertigo with feeling off-balance.  This was followed by intermittent numbness and tingling of her hands and feet.  In November 2014, she developed double vision associated with slurred speech, right sided numbness and difficulty swallowing (the right side of her mouth, tongue and throat felt numb).  MRI of the brain with and without contrast from 07/12/13 was personally reviewed and showed hyperintense T2 lesions in the on left side of the midbrain.  MRI of cervical spine from 08/02/13 was personally reviewed and was negative for demyelinating lesions.  She underwent a lumbar puncture in January 2015.  CSF cell count was 1, protein 33, glucose 56, 3 oligoclonal bands (not present in serum), negative myelin basic protein, IgG index 0.52.  NMO antibody was negative.   Past disease modifying medication:  Tecfidera  (effective but caused significant GERD), Gilenya  (GI problems, transaminitis)   Other past medications:  gabapentin  (fatigued), steroids (elevated blood  pressure), Provigil  100mg  (effective), nortriptyline  (weight gain)   At baseline, she had paresthesias of the right side of her face and throat.  She also has paresthesias in the  upper extremities and toes, worse on the right.  Sometimes she needs to use a cane. Heat (such as during the summer months), stress and lack of sleep exacerbate chronic symptoms.  She also has restless leg syndrome.   In January 2019, she had an elevatedt bili 1.3, AST of 52 and ALT of 69.  It may have been due to gallstones rather than Gilenya , for which she underwent surgery.  Over the next several months, her LFTs have been monitored and have steadily trended down.  Overall, she continues to have mild transaminitis which may be secondary to Gilenya .   In February 2019, she reported some headaches, a shooting pain radiating from the back of the head to the eye, usually right sided but also left sided as well.  It lasts only a few seconds and occurs two to three times a week.  She reported some neck pain.  She also endorsed weakness and postural tremor in the hands.  Due to these new symptoms, she underwent repeat MRI of brain and cervical spine with and without contrast on 11/11/17, which was personally reviewed.  The brain showed stable and unchanged punctate focus of abnormal contrast enhancement in the left paramedian midbrain which was seen back in August.  It is not seen on other sequences.  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   Migraines.  Moderate right-mid occipital pressure by end of day, daily.  Nausea, photophobia, when severe vomiting and tunnel vision/floaters/blurred vision.  They are severe 2 times a month.   Tension-type headaches.  Ocular migraine:  visual disturbance, like looking through a tunnel that expanded until she couldn't see, lasted a 5-6 minutes.  Associated headache.   Imaging: 07/12/2013 MRI BRAIN W WO:  hyperintense T2 lesions in the on left side of the midbrain.   08/02/2013 MRI CERVICAL SPINE W WO:  negative for demyelinating lesions 01/18/2014 MRI BRAIN W WO: reportedly normal with resolution of the  previous midbrain lesion (report available but not images).   10/09/2015 MRI BRAIN W WO: reportedly again unremarkable and showed no significant white matter disease.  Known 6 mm pineal cyst was noted.   04/14/2017 MRI BRAIN W WO:  punctate focus of enhancement in the location of previous left midbrain lesion, of uncertain etiology and significance.  Pineal cyst measures 7 mm. 04/11/2019 MRI BRAIN W WO:  stable, again noted unchanged punctate focus of enhancement in the midbrain without associated T2 signal abnormality.  Incidental 8 mm pineal cyst unchanged.  Otherwise, brain unremarkable. 01/01/2020 MRI BRAIN W WO:  Now visible within the cerebral hemispheric white matter are numerous small and punctate foci of T2 and FLAIR signal within the deep and subcortical white matter, approximately 8-10 on each side.  The differential diagnosis is that of a manifestation of demyelinating disease versus interval expression of small-vessel disease. Majority of these were not present on the previous examinations. None show restricted diffusion or contrast enhancement.  Continued punctate focus of contrast enhancement in the left midbrain as seen previously, not felt to be clinically relevant. 12/01/2020 MRI BRAIN W WO:  Unchanged multifocal white matter hyperintensity consistent with multiple sclerosis. No new or acute intracranial abnormality. 08/07/2021 MRI C-SPINE W WO:  "1. Moderate to severe left-sided degenerative foraminal stenosis at C4-5.  2. Left paracentral disc/osteophyte complex at C5-6 with left ventral cord indentation, left lateral recess narrowing, and severe stenosis of the left foramen.  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  No active enhancement at the site at this time.  Minimal T2/FLAIR hyperintense foci with the white matter, nonspecific but could be seen with small vessel ischemic changes, migraine headaches, or sequela of demyelinating disease.  Other less likely causes of gliosis are possible.  No actively demyelinating lesions are evident. 07/10/2022 MRI C-SPINE W WO:  No abnormal spinal cord lesions or enhancing spinal cord lesions.  Cervical spondylosis is similar to prior, to include left-sided foraminal stenosis at C4-C5 and C5-C6.  Modic 1 endplate changes are redemonstrated at C5-C6.  PAST MEDICAL HISTORY: Past Medical History:  Diagnosis Date   Anxiety    Arthritis    Bulging discs 1998   cervical   Complication of anesthesia    Constipation    Depression    Dysrhythmia    Floppy mitral valve    dx 30 yrs ago   GERD (gastroesophageal reflux disease)    Gilbert syndrome since late teens   Headache(784.0)    Heart palpitations    Hemorrhoids    Hypertension    MS (multiple sclerosis) (HCC)    PONV (postoperative nausea and vomiting)    last couple surgeries, she has done ok    MEDICATIONS: Current Outpatient Medications on File Prior to Visit  Medication Sig Dispense Refill   ALPRAZolam  (XANAX ) 1 MG tablet TAKE 1 TABLET BY MOUTH 3 TIMES DAILY AS NEEDED FOR ANXIETY. 30 tablet 3   escitalopram  (LEXAPRO ) 20 MG tablet TAKE 1 TABLET BY MOUTH EVERY DAY 90 tablet 2   Magnesium 250 MG TABS Take by mouth.     Melatonin 10 MG TABS Take 1 tablet by mouth daily.      metoprolol succinate (TOPROL-XL) 25 MG 24 hr tablet TAKE 1 TABLET BY MOUTH TWICE A DAY 180 tablet 1   omeprazole  (PRILOSEC) 40 MG capsule TAKE 1 CAPSULE BY MOUTH TWICE A DAY BEFORE A MEAL 180 capsule 2   tiZANidine  (ZANAFLEX ) 4 MG tablet TAKE 1 TABLET BY MOUTH EVERY 6 HOURS AS NEEDED FOR MUSCLE SPASMS. 30 tablet 5   topiramate  (TOPAMAX ) 50 MG tablet TAKE 1/2 TABLET AT BEDTIME FOR ONE WEEK, THEN  INCREASE TO 1 TABLET AT BEDTIME 90 tablet 1   valACYclovir  (VALTREX ) 500 MG tablet TAKE 1 TABLET BY MOUTH TWICE A DAY FOR FEVER BLISTERS FOR 5 DAYS 30 tablet 2   Biotin 5000 MCG TABS Take 1 tablet by mouth daily.  (Patient not taking: Reported on 07/17/2023)     Dexlansoprazole (DEXILANT PO) Take by mouth. (Patient not taking: Reported on 02/09/2024)     modafinil  (PROVIGIL ) 100 MG tablet TAKE 1 TABLET BY MOUTH ONCE (1) DAILY (Patient not taking: Reported on 02/09/2024) 30 tablet 5   nortriptyline  (PAMELOR ) 25 MG capsule TAKE 1 CAPSULE BY MOUTH AT BEDTIME. (Patient not taking: Reported on 02/09/2024) 90 capsule 2   Ofatumumab  (KESIMPTA ) 20 MG/0.4ML SOAJ Inject 20 mg into the skin every 30 (thirty) days. (Patient not taking: Reported on 02/09/2024) 1 mL 5   Omega-3 Fatty Acids (FISH OIL) 1000 MG  CAPS Take 1 capsule by mouth daily.  (Patient not taking: Reported on 02/09/2024)     No current facility-administered medications on file prior to visit.     ALLERGIES: Allergies  Allergen Reactions   Morphine Nausea And Vomiting   Oxycodone  Other (See Comments) and Nausea And Vomiting   Latex Rash    FAMILY HISTORY: Family History  Problem Relation Age of Onset   Heart disease Father    Colonic polyp Father    Hypertension Mother    Diabetes Mother    Skin cancer Mother    Alzheimer's disease Maternal Grandmother    Diabetes Maternal Grandmother    Hypertension Maternal Grandmother    Colon cancer Neg Hx       Objective:  Blood pressure (!) 147/96, pulse 68, height 5\' 9"  (1.753 m), weight 201 lb (91.2 kg), SpO2 96%. General: No acute distress.  Patient appears well-groomed.   Head:  Normocephalic/atraumatic Eyes:  Fundi examined but not visualized Neck: supple, no paraspinal tenderness, full range of motion Heart:  Regular rate and rhythm Neurological Exam: alert and oriented.  Speech fluent and not dysarthric, language intact.  Altered right V2-V3 sensation.  Otherwise, CN II-XII intact.  Bulk and tone normal, muscle strength 5/5 throughout.  Slight postural tremor in hands (left greater than right).  Sensation to pinprick reduced in right greater than left toes.  Vibratory sensation reduced in right foot.  Deep tendon reflexes 3+ throughout, toes downgoing.  Finger to nose testing intact.  Mildly wide-based gait, Romberg positive.   Janne Members, DO  CC: Vernette Goo, DO

## 2024-02-09 ENCOUNTER — Ambulatory Visit: Admitting: Neurology

## 2024-02-09 ENCOUNTER — Encounter: Payer: Self-pay | Admitting: Neurology

## 2024-02-09 VITALS — BP 147/96 | HR 68 | Ht 69.0 in | Wt 201.0 lb

## 2024-02-09 DIAGNOSIS — G43009 Migraine without aura, not intractable, without status migrainosus: Secondary | ICD-10-CM

## 2024-02-09 DIAGNOSIS — G35 Multiple sclerosis: Secondary | ICD-10-CM

## 2024-02-09 DIAGNOSIS — G44229 Chronic tension-type headache, not intractable: Secondary | ICD-10-CM | POA: Diagnosis not present

## 2024-02-09 MED ORDER — EMGALITY 120 MG/ML ~~LOC~~ SOAJ: 240.0000 mg | Freq: Once | SUBCUTANEOUS | 0 refills | Status: AC

## 2024-02-09 MED ORDER — EMGALITY 120 MG/ML ~~LOC~~ SOAJ: 240.0000 mg | Freq: Once | SUBCUTANEOUS | 0 refills | Status: DC

## 2024-02-09 MED ORDER — EMGALITY 120 MG/ML ~~LOC~~ SOAJ: 120.0000 mg | SUBCUTANEOUS | 5 refills | Status: DC

## 2024-02-09 NOTE — Patient Instructions (Signed)
 Plan to change from topiramate  to Emgality:  First dose 2 injections, then 1 injection every 28 days thereafter.  Once you start the new med, then stop topiramate  Continue other medications. Check CBC with diff, CMP, IgG and vit D in 6 months Follow up in 6 months.

## 2024-02-10 ENCOUNTER — Telehealth: Payer: Self-pay | Admitting: Pharmacist

## 2024-02-10 NOTE — Telephone Encounter (Signed)
 Pharmacy Patient Advocate Encounter  Received notification from [employer] that Prior Authorization for Emgality 120MG /ML auto-injectors (migraine) has been APPROVED from 09/09/2023 to 09/07/2024   PA #/Case ID/Reference #: [id number]

## 2024-02-10 NOTE — Telephone Encounter (Signed)
 Pharmacy Patient Advocate Encounter   Received notification from Patient Pharmacy that prior authorization for Emgality 120MG /ML auto-injectors (migraine) is required/requested.   Insurance verification completed.   The patient is insured through [place] .   Per test claim: PA required; PA submitted to above mentioned insurance via CoverMyMeds Key/confirmation #/[id number] Status is pending

## 2024-02-11 ENCOUNTER — Other Ambulatory Visit (HOSPITAL_COMMUNITY): Payer: Self-pay

## 2024-02-12 ENCOUNTER — Other Ambulatory Visit (HOSPITAL_COMMUNITY): Payer: Self-pay

## 2024-03-02 ENCOUNTER — Other Ambulatory Visit: Payer: Self-pay | Admitting: Neurology

## 2024-03-03 ENCOUNTER — Telehealth: Payer: Self-pay

## 2024-03-03 NOTE — Telephone Encounter (Signed)
 [employer] Patient [employer], Patient approved and is eligible to receive Kesimpta  until 13/31/2025.

## 2024-06-01 ENCOUNTER — Encounter: Payer: Self-pay | Admitting: Neurology

## 2024-06-01 ENCOUNTER — Other Ambulatory Visit: Payer: Self-pay | Admitting: Neurology

## 2024-06-01 MED ORDER — ALPRAZOLAM 1 MG PO TABS: 1.0000 mg | ORAL_TABLET | Freq: Every evening | ORAL | 1 refills | Status: DC | PRN

## 2024-07-12 LAB — COMPREHENSIVE METABOLIC PANEL WITH GFR
AG Ratio: 2 (calc) (ref 1.0–2.5)
ALT: 16 U/L (ref 6–29)
AST: 15 U/L (ref 10–35)
Albumin: 4.6 g/dL (ref 3.6–5.1)
Alkaline phosphatase (APISO): 104 U/L (ref 37–153)
BUN/Creatinine Ratio: 19 (calc) (ref 6–22)
BUN: 20 mg/dL (ref 7–25)
CO2: 23 mmol/L (ref 20–32)
Calcium: 9.6 mg/dL (ref 8.6–10.4)
Chloride: 107 mmol/L (ref 98–110)
Creat: 1.05 mg/dL — ABNORMAL HIGH (ref 0.50–1.03)
Globulin: 2.3 g/dL (ref 1.9–3.7)
Glucose, Bld: 81 mg/dL (ref 65–139)
Potassium: 4.2 mmol/L (ref 3.5–5.3)
Sodium: 138 mmol/L (ref 135–146)
Total Bilirubin: 1.5 mg/dL — ABNORMAL HIGH (ref 0.2–1.2)
Total Protein: 6.9 g/dL (ref 6.1–8.1)
eGFR: 61 mL/min/1.73m2 (ref >=60)

## 2024-07-12 LAB — CBC WITH DIFFERENTIAL/PLATELET
Absolute Lymphocytes: 1729 {cells}/uL (ref 850–3900)
Absolute Monocytes: 342 {cells}/uL (ref 200–950)
Basophils Absolute: 102 {cells}/uL (ref 0–200)
Basophils Relative: 2 %
Eosinophils Absolute: 133 {cells}/uL (ref 15–500)
Eosinophils Relative: 2.6 %
HCT: 44.9 % (ref 35.0–45.0)
Hemoglobin: 14.4 g/dL (ref 11.7–15.5)
MCH: 29.9 pg (ref 27.0–33.0)
MCHC: 32.1 g/dL (ref 32.0–36.0)
MCV: 93.3 fL (ref 80.0–100.0)
MPV: 11.3 fL (ref 7.5–12.5)
Monocytes Relative: 6.7 %
Neutro Abs: 2795 {cells}/uL (ref 1500–7800)
Neutrophils Relative %: 54.8 %
Platelets: 305 Thousand/uL (ref 140–400)
RBC: 4.81 Million/uL (ref 3.80–5.10)
RDW: 13.1 % (ref 11.0–15.0)
Total Lymphocyte: 33.9 %
WBC: 5.1 Thousand/uL (ref 3.8–10.8)

## 2024-07-12 LAB — IGG: IgG (Immunoglobin G), Serum: 964 mg/dL (ref 600–1640)

## 2024-07-12 LAB — VITAMIN D 25 HYDROXY (VIT D DEFICIENCY, FRACTURES): Vit D, 25-Hydroxy: 39 ng/mL (ref 30–100)

## 2024-07-29 ENCOUNTER — Encounter: Payer: Self-pay | Admitting: Neurology

## 2024-07-29 ENCOUNTER — Other Ambulatory Visit: Payer: Self-pay | Admitting: Neurology

## 2024-07-29 MED ORDER — ESCITALOPRAM OXALATE 20 MG PO TABS: 20.0000 mg | ORAL_TABLET | Freq: Every day | ORAL | 0 refills | Status: AC

## 2024-08-24 ENCOUNTER — Other Ambulatory Visit (HOSPITAL_COMMUNITY): Payer: Self-pay

## 2024-08-24 ENCOUNTER — Telehealth: Payer: Self-pay | Admitting: Pharmacy Technician

## 2024-08-24 NOTE — Telephone Encounter (Signed)
 Pharmacy Patient Advocate Encounter  Received notification from [employer] that Prior Authorization for EMGALITY  120MG  has been APPROVED from 1.1.25 to 12.1.26. Ran test claim, Copay is $317.59. This test claim was processed through [facility]- copay amounts may vary at other pharmacies due to pharmacy/plan contracts, or as the patient moves through the different stages of their insurance plan.   PA #/Case ID/Reference #: [id number]

## 2024-08-24 NOTE — Telephone Encounter (Signed)
 Pharmacy Patient Advocate Encounter   Received notification from CoverMyMeds that prior authorization for EMGALITY  120MG  is required/requested.   Insurance verification completed.   The patient is insured through [place].   Per test claim: PA required; PA submitted to above mentioned insurance via Latent Key/confirmation #/EOC [facility] Status is pending

## 2024-08-26 ENCOUNTER — Other Ambulatory Visit: Payer: Self-pay | Admitting: Neurology

## 2024-08-29 ENCOUNTER — Telehealth: Payer: Self-pay

## 2024-08-29 ENCOUNTER — Other Ambulatory Visit

## 2024-08-29 DIAGNOSIS — G35D Multiple sclerosis, unspecified: Secondary | ICD-10-CM

## 2024-08-29 NOTE — Telephone Encounter (Signed)
 Labs printed.

## 2024-08-30 LAB — CBC WITH DIFFERENTIAL/PLATELET
Absolute Lymphocytes: 1695 {cells}/uL (ref 850–3900)
Absolute Monocytes: 348 {cells}/uL (ref 200–950)
Basophils Absolute: 57 {cells}/uL (ref 0–200)
Basophils Relative: 1.1 %
Eosinophils Absolute: 140 {cells}/uL (ref 15–500)
Eosinophils Relative: 2.7 %
HCT: 41.6 % (ref 35.9–46.0)
Hemoglobin: 13.5 g/dL (ref 11.7–15.5)
MCH: 30.3 pg (ref 27.0–33.0)
MCHC: 32.5 g/dL (ref 31.6–35.4)
MCV: 93.5 fL (ref 81.4–101.7)
MPV: 11 fL (ref 7.5–12.5)
Monocytes Relative: 6.7 %
Neutro Abs: 2959 {cells}/uL (ref 1500–7800)
Neutrophils Relative %: 56.9 %
Platelets: 286 Thousand/uL (ref 140–400)
RBC: 4.45 Million/uL (ref 3.80–5.10)
RDW: 13.5 % (ref 11.0–15.0)
Total Lymphocyte: 32.6 %
WBC: 5.2 Thousand/uL (ref 3.8–10.8)

## 2024-08-30 LAB — COMPREHENSIVE METABOLIC PANEL WITH GFR
AG Ratio: 2.1 (calc) (ref 1.0–2.5)
ALT: 11 U/L (ref 6–29)
AST: 14 U/L (ref 10–35)
Albumin: 4.4 g/dL (ref 3.6–5.1)
Alkaline phosphatase (APISO): 94 U/L (ref 37–153)
BUN: 20 mg/dL (ref 7–25)
CO2: 27 mmol/L (ref 20–32)
Calcium: 9.3 mg/dL (ref 8.6–10.4)
Chloride: 106 mmol/L (ref 98–110)
Creat: 0.85 mg/dL (ref 0.50–1.03)
Globulin: 2.1 g/dL (ref 1.9–3.7)
Glucose, Bld: 73 mg/dL (ref 65–99)
Potassium: 4.5 mmol/L (ref 3.5–5.3)
Sodium: 141 mmol/L (ref 135–146)
Total Bilirubin: 0.7 mg/dL (ref 0.2–1.2)
Total Protein: 6.5 g/dL (ref 6.1–8.1)
eGFR: 79 mL/min/1.73m2

## 2024-08-30 LAB — VITAMIN D 25 HYDROXY (VIT D DEFICIENCY, FRACTURES): Vit D, 25-Hydroxy: 46 ng/mL (ref 30–100)

## 2024-08-30 LAB — IGG: IgG (Immunoglobin G), Serum: 902 mg/dL (ref 600–1640)

## 2024-09-02 NOTE — Progress Notes (Signed)
 "  NEUROLOGY FOLLOW UP OFFICE NOTE  Crystal Simon [id number]  Assessment/Plan:   Multiple sclerosis Migraine without aura, without status migrainosus, not intractable Ocular migraine   DMT:  Kesimpta .  Due to age and as she has remained clinically stable with stable MRIs for years, plan will be to discontinue Kesimpta  at next visit. Headache prevention: Defer (currently controlled) Headache rescue:  Tizanidine  4mg  PRN.  May use ibuprofen  but limit to no more than 2 days out of week to prevent rebound headache.  D3 5,000 IU daily Refill Xanax  (which may be helping RLS as well.   For further evaluation of RLS, check ferritin, B12 and Mg Follow up in 6 months.   Subjective:  Crystal Simon is a 58 year old right-handed Caucasian woman who follows up for multiple sclerosis and migraines.       UPDATE: Current disease modifying therapy:  Kesimpta  (started March 2021) Other medications: Emgality , tizanidine  4mg  PRN, ibuprofen , metoprolol, Lexapro  10mg ; D3 5,000 IU daily  08/29/2024 LABS:  IgG 902; CBC with WBC 5.2, HGB 13.5, HCT 41.6, PLT 286; CMP with Na 141, K 4.5, Cl 106, CO2 27, glucose 73, BUN 20, Cr 0.85, t bili 0.7, ALP 94, AST 14, ALT 11; vit D 46   Vision: Stable Motor: Notes kinetic tremors in hands after activities with her hands (vacuuming, pulling weeds).  Lasts until she is able to rest for 30-40 minutes.     Sensory: Chronic sensory deficits, sometimes right side of face and neck Pain: muscle spasms and aching in the legs and chest.  Gait: Chronic disability.  Uses a cane.  Had a fall in June. Bowel/Bladder: Irritable bowel syndrome. Fatigue: Stable.  Takes modafinil  as needed. Improved after discontinuing statin. Cognition: In January and February, started having brain fog.  One time, she put her dog outside and forgot to let her back in.  She got her house and car insurance mixed up. Since discontinuing topiramate , it is improved.   Mood: She does feel sad during the  holidays.  Migraines:  Due to brain fog, topiramate  changed to Emgality .  Caused weight gain, so she discontinued.  Now, gets an ocular migraine (sees prism ) once a month.   Tension-type headache:  mild, daily She always feels nauseous Sleep:  Poor.  Wakes up in middle of night.  Restless leg.   Xanax  -     HISTORY: Initial symptom in 2012 presented with 1 week episode of vertigo with feeling off-balance.  This was followed by intermittent numbness and tingling of her hands and feet.  In November 2014, she developed double vision associated with slurred speech, right sided numbness and difficulty swallowing (the right side of her mouth, tongue and throat felt numb).  MRI of the brain with and without contrast from 07/12/13 was personally reviewed and showed hyperintense T2 lesions in the on left side of the midbrain.  MRI of cervical spine from 08/02/13 was personally reviewed and was negative for demyelinating lesions.  She underwent a lumbar puncture in January 2015.  CSF cell count was 1, protein 33, glucose 56, 3 oligoclonal bands (not present in serum), negative myelin basic protein, IgG index 0.52.  NMO antibody was negative.   Past disease modifying medication:  Tecfidera  (effective but caused significant GERD), Gilenya  (GI problems, transaminitis)   Other past medications:  gabapentin  (fatigued), steroids (elevated blood pressure), Provigil  100mg  (effective), nortriptyline  (weight gain), topiramate  50mg  every afternoon (caused hyperactivity and vivid dreams at night, brain fog), Emgality  (weight gain)   At  baseline, she had paresthesias of the right side of her face and throat.  She also has paresthesias in the upper extremities and toes, worse on the right.  Sometimes she needs to use a cane. Heat (such as during the summer months), stress and lack of sleep exacerbate chronic symptoms.  She also has restless leg syndrome.   In January 2019, she had an elevatedt bili 1.3, AST of 52 and ALT of  69.  It may have been due to gallstones rather than Gilenya , for which she underwent surgery.  Over the next several months, her LFTs have been monitored and have steadily trended down.  Overall, she continues to have mild transaminitis which may be secondary to Gilenya .   In February 2019, she reported some headaches, a shooting pain radiating from the back of the head to the eye, usually right sided but also left sided as well.  It lasts only a few seconds and occurs two to three times a week.  She reported some neck pain.  She also endorsed weakness and postural tremor in the hands.  Due to these new symptoms, she underwent repeat MRI of brain and cervical spine with and without contrast on 11/11/17, which was personally reviewed.  The brain showed stable and unchanged punctate focus of abnormal contrast enhancement in the left paramedian midbrain which was seen back in August.  It is not seen on other sequences.  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   Migraines.  Moderate right-mid occipital pressure by end of day, daily.  Nausea, photophobia, when severe vomiting and tunnel vision/floaters/blurred vision.  They are severe 2 times a month.   Tension-type headaches.  Ocular migraine:  visual disturbance, like looking through a tunnel that expanded until she couldn't see, lasted a 5-6 minutes.  Associated headache.   Imaging: 07/12/2013 MRI BRAIN W WO:  hyperintense T2 lesions in the on left side of the midbrain.   08/02/2013 MRI CERVICAL SPINE W WO:  negative for demyelinating lesions 01/18/2014 MRI BRAIN W WO: reportedly normal with resolution of the previous midbrain lesion (report available but not images).   10/09/2015 MRI BRAIN W WO: reportedly again unremarkable and showed no significant white matter disease.  Known 6 mm pineal cyst was noted.   04/14/2017 MRI BRAIN W WO:  punctate focus of enhancement in the location of  previous left midbrain lesion, of uncertain etiology and significance.  Pineal cyst measures 7 mm. 04/11/2019 MRI BRAIN W WO:  stable, again noted unchanged punctate focus of enhancement in the midbrain without associated T2 signal abnormality.  Incidental 8 mm pineal cyst unchanged.  Otherwise, brain unremarkable. 01/01/2020 MRI BRAIN W WO:  Now visible within the cerebral hemispheric white matter are numerous small and punctate foci of T2 and FLAIR signal within the deep and subcortical white matter, approximately 8-10 on each side.  The differential diagnosis is that of a manifestation of demyelinating disease versus interval expression of small-vessel disease. Majority of these were not present on the previous examinations. None show restricted diffusion or contrast enhancement.  Continued punctate focus of contrast enhancement in the left midbrain as seen previously, not felt to be clinically relevant. 12/01/2020 MRI BRAIN W WO:  Unchanged multifocal white matter hyperintensity consistent with multiple sclerosis. No new or acute intracranial abnormality. 08/07/2021 MRI C-SPINE W WO:  1. Moderate to severe left-sided degenerative foraminal stenosis at C4-5.   2. Left paracentral disc/osteophyte complex at C5-6 with left  ventral cord indentation, left lateral recess narrowing, and severe stenosis of the left foramen.  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  No active enhancement at the site at this time.  Minimal T2/FLAIR hyperintense foci with the white matter, nonspecific but could be seen with small vessel ischemic changes, migraine headaches, or sequela of demyelinating disease.  Other less  likely causes of gliosis are possible.  No actively demyelinating lesions are evident. 07/10/2022 MRI C-SPINE W WO:  No abnormal spinal cord lesions or enhancing spinal cord lesions.  Cervical spondylosis is similar to prior, to include left-sided foraminal stenosis at C4-C5 and C5-C6.  Modic 1 endplate changes are redemonstrated at C5-C6. 01/20/2024 MRI BRAIN W WO:  Similar nonspecific mild white matter changes which do not have the typical appearance for demyelinating disease. Differential could include early chronic microvascular ischemia, vasculitis, or atypical demyelinating disease. There is no evidence for active demyelinating disease. 01/20/2024 MRI C-SPINE W WO:  1. No evidence of cervical spinal cord demyelination. 2. 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 No spinal stenosis but moderate to severe chronic left C5 and C6 neural foraminal stenosis.  PAST MEDICAL HISTORY: Past Medical History:  Diagnosis Date   Anxiety    Arthritis    Bulging discs 1998   cervical   Complication of anesthesia    Constipation    Depression    Dysrhythmia    Floppy mitral valve    dx 30 yrs ago   GERD (gastroesophageal reflux disease)    Gilbert syndrome since late teens   Headache(784.0)    Heart palpitations    Hemorrhoids    Hypertension    MS (multiple sclerosis)    PONV (postoperative nausea and vomiting)    last couple surgeries, she has done ok    MEDICATIONS: Current Outpatient Medications on File Prior to Visit  Medication Sig Dispense Refill   ALPRAZolam  (XANAX ) 1 MG tablet Take 1 tablet (1 mg total) by mouth at bedtime as needed for anxiety. 30 tablet 1   Biotin 5000 MCG TABS Take 1 tablet by mouth daily.  (Patient not taking: Reported on 07/17/2023)     Dexlansoprazole (DEXILANT PO) Take by mouth. (Patient not taking: Reported on 02/09/2024)     escitalopram  (LEXAPRO ) 20 MG tablet Take 1 tablet (20 mg total) by  mouth daily. 90 tablet 0   Galcanezumab -gnlm (EMGALITY ) 120 MG/ML SOAJ Inject 120 mg into the skin every 28 (twenty-eight) days. 1.12 mL 5   Magnesium 250 MG TABS Take by mouth.     Melatonin 10 MG TABS Take 1 tablet by mouth daily.      metoprolol succinate (TOPROL-XL) 25 MG 24 hr tablet TAKE 1 TABLET BY MOUTH TWICE A DAY 180 tablet 1   modafinil  (PROVIGIL ) 100 MG tablet TAKE 1 TABLET BY MOUTH ONCE (1) DAILY (Patient not taking: Reported on 02/09/2024) 30 tablet 5   nortriptyline  (PAMELOR ) 25 MG capsule TAKE 1 CAPSULE BY MOUTH AT BEDTIME. (Patient not taking: Reported on 02/09/2024) 90 capsule 2   Ofatumumab  (KESIMPTA ) 20 MG/0.4ML SOAJ Inject 20 mg into the skin every 30 (thirty) days. (Patient not taking: Reported on 02/09/2024) 1 mL 5  Omega-3 Fatty Acids (FISH OIL) 1000 MG CAPS Take 1 capsule by mouth daily.  (Patient not taking: Reported on 02/09/2024)     omeprazole  (PRILOSEC) 40 MG capsule TAKE 1 CAPSULE BY MOUTH TWICE A DAY BEFORE A MEAL 180 capsule 2   tiZANidine  (ZANAFLEX ) 4 MG tablet TAKE 1 TABLET BY MOUTH EVERY 6 HOURS AS NEEDED FOR MUSCLE SPASMS. 30 tablet 5   topiramate  (TOPAMAX ) 50 MG tablet TAKE 1/2 TABLET AT BEDTIME FOR ONE WEEK, THEN INCREASE TO 1 TABLET AT BEDTIME 90 tablet 1   valACYclovir  (VALTREX ) 500 MG tablet TAKE 1 TABLET BY MOUTH TWICE A DAY FOR FEVER BLISTERS FOR 5 DAYS 30 tablet 2   No current facility-administered medications on file prior to visit.     ALLERGIES: Allergies  Allergen Reactions   Morphine Nausea And Vomiting   Oxycodone  Other (See Comments) and Nausea And Vomiting   Latex Rash    FAMILY HISTORY: Family History  Problem Relation Age of Onset   Heart disease Father    Colonic polyp Father    Hypertension Mother    Diabetes Mother    Skin cancer Mother    Alzheimer's disease Maternal Grandmother    Diabetes Maternal Grandmother    Hypertension Maternal Grandmother    Colon cancer Neg Hx       Objective:  Blood pressure (!) 146/82, pulse 70,  height 5' 9 (1.753 m), weight 201 lb 9.6 oz (91.4 kg), SpO2 100%. General: No acute distress.  Patient appears well-groomed.   Head:  Normocephalic/atraumatic Eyes:  Fundi examined but not visualized Neck: supple, no paraspinal tenderness, full range of motion Heart:  Regular rate and rhythm Neurological Exam: Alert and oriented.  Speech fluent and not dysarthric.  Language intact.  Altered right V2-V3 sensation.  Otherwise, CN II-XII intact.  Muscle strength 5/5 throughout.  Slight postural tremor in hands (left greater than right).  Sensation to pinprick reduced in right greater than left toes.  Vibratory sensation reduced in right foot.  Deep tendon reflexes 3+ throughout, toes downgoing.  Mildly wide-based gait.  Romberg positive.  Juliene Dunnings, DO        "

## 2024-09-05 ENCOUNTER — Encounter: Payer: Self-pay | Admitting: Neurology

## 2024-09-05 ENCOUNTER — Other Ambulatory Visit

## 2024-09-05 ENCOUNTER — Ambulatory Visit: Admitting: Neurology

## 2024-09-05 VITALS — BP 146/82 | HR 70 | Ht 69.0 in | Wt 201.6 lb

## 2024-09-05 DIAGNOSIS — G35D Multiple sclerosis, unspecified: Secondary | ICD-10-CM | POA: Diagnosis not present

## 2024-09-05 DIAGNOSIS — G43009 Migraine without aura, not intractable, without status migrainosus: Secondary | ICD-10-CM

## 2024-09-05 DIAGNOSIS — G2581 Restless legs syndrome: Secondary | ICD-10-CM | POA: Diagnosis not present

## 2024-09-05 MED ORDER — ALPRAZOLAM 1 MG PO TABS: 1.0000 mg | ORAL_TABLET | Freq: Every evening | ORAL | 1 refills | Status: AC | PRN

## 2024-09-05 NOTE — Patient Instructions (Addendum)
 Help finding a primary care provider within [place]. If you do not have access to a computer or the Internet you can call [phone] and  they will help you scheduled with a provider.  Or you can go to: [email or web address]    Continue medications.  Next visit, plan to discontinue Kesimpta  (not now).   Check ferritin level, B12, and Mg

## 2024-09-06 DIAGNOSIS — G35D Multiple sclerosis, unspecified: Secondary | ICD-10-CM

## 2024-09-06 DIAGNOSIS — G44229 Chronic tension-type headache, not intractable: Secondary | ICD-10-CM

## 2024-09-06 DIAGNOSIS — G44219 Episodic tension-type headache, not intractable: Secondary | ICD-10-CM

## 2024-09-06 DIAGNOSIS — G43009 Migraine without aura, not intractable, without status migrainosus: Secondary | ICD-10-CM

## 2024-09-06 DIAGNOSIS — G43109 Migraine with aura, not intractable, without status migrainosus: Secondary | ICD-10-CM

## 2024-09-06 DIAGNOSIS — G2581 Restless legs syndrome: Secondary | ICD-10-CM

## 2024-09-06 LAB — FERRITIN: Ferritin: 9 ng/mL — ABNORMAL LOW (ref 16–232)

## 2024-09-06 LAB — VITAMIN B12: Vitamin B-12: 247 pg/mL (ref 200–1100)

## 2024-09-06 MED ORDER — FERROUS SULFATE 325 (65 FE) MG PO TBEC: 325.0000 mg | DELAYED_RELEASE_TABLET | Freq: Every day | ORAL | 5 refills | Status: AC

## 2024-09-06 MED ORDER — CYANOCOBALAMIN 2000 MCG PO TABS: 1000.0000 ug | ORAL_TABLET | Freq: Every day | ORAL | 5 refills | Status: AC

## 2024-09-06 NOTE — Telephone Encounter (Signed)
 Called patient and informed her of dr. Venus results and she wanted me to send it to her on MY and I did.

## 2025-03-20 ENCOUNTER — Ambulatory Visit: Payer: Self-pay | Admitting: Neurology
